# Patient Record
Sex: Male | Born: 1940 | Race: White | Hispanic: No | Marital: Single | Smoking: Former smoker
Health system: Southern US, Community
[De-identification: ages and names within clinical notes are randomized; demographics above are authoritative.]

## PROBLEM LIST (undated history)

## (undated) DIAGNOSIS — N200 Calculus of kidney: Secondary | ICD-10-CM

## (undated) DIAGNOSIS — E785 Hyperlipidemia, unspecified: Secondary | ICD-10-CM

## (undated) DIAGNOSIS — G479 Sleep disorder, unspecified: Secondary | ICD-10-CM

## (undated) DIAGNOSIS — K635 Polyp of colon: Secondary | ICD-10-CM

## (undated) DIAGNOSIS — K7460 Unspecified cirrhosis of liver: Secondary | ICD-10-CM

## (undated) DIAGNOSIS — I8510 Secondary esophageal varices without bleeding: Secondary | ICD-10-CM

## (undated) DIAGNOSIS — N189 Chronic kidney disease, unspecified: Secondary | ICD-10-CM

## (undated) DIAGNOSIS — R161 Splenomegaly, not elsewhere classified: Secondary | ICD-10-CM

## (undated) DIAGNOSIS — D494 Neoplasm of unspecified behavior of bladder: Secondary | ICD-10-CM

## (undated) DIAGNOSIS — I219 Acute myocardial infarction, unspecified: Secondary | ICD-10-CM

## (undated) DIAGNOSIS — K7469 Other cirrhosis of liver: Secondary | ICD-10-CM

## (undated) DIAGNOSIS — IMO0001 Reserved for inherently not codable concepts without codable children: Secondary | ICD-10-CM

## (undated) DIAGNOSIS — E119 Type 2 diabetes mellitus without complications: Secondary | ICD-10-CM

## (undated) DIAGNOSIS — I4891 Unspecified atrial fibrillation: Secondary | ICD-10-CM

## (undated) DIAGNOSIS — Z8709 Personal history of other diseases of the respiratory system: Secondary | ICD-10-CM

## (undated) DIAGNOSIS — K922 Gastrointestinal hemorrhage, unspecified: Secondary | ICD-10-CM

## (undated) DIAGNOSIS — I2510 Atherosclerotic heart disease of native coronary artery without angina pectoris: Secondary | ICD-10-CM

## (undated) DIAGNOSIS — I509 Heart failure, unspecified: Secondary | ICD-10-CM

## (undated) DIAGNOSIS — J439 Emphysema, unspecified: Secondary | ICD-10-CM

## (undated) DIAGNOSIS — N400 Enlarged prostate without lower urinary tract symptoms: Secondary | ICD-10-CM

## (undated) DIAGNOSIS — I639 Cerebral infarction, unspecified: Secondary | ICD-10-CM

## (undated) HISTORY — PX: UPPER GASTROINTESTINAL ENDOSCOPY: SHX188

## (undated) HISTORY — DX: Cerebral infarction, unspecified: I63.9

## (undated) HISTORY — PX: APPENDECTOMY: SHX54

## (undated) HISTORY — DX: Heart failure, unspecified: I50.9

## (undated) HISTORY — PX: ATRIAL ABLATION SURGERY: SHX560

## (undated) HISTORY — DX: Sleep disorder, unspecified: G47.9

## (undated) HISTORY — DX: Polyp of colon: K63.5

## (undated) HISTORY — DX: Splenomegaly, not elsewhere classified: R16.1

## (undated) HISTORY — DX: Unspecified atrial fibrillation: I48.91

## (undated) HISTORY — DX: Type 2 diabetes mellitus without complications: E11.9

## (undated) HISTORY — DX: Unspecified cirrhosis of liver: K74.60

## (undated) HISTORY — PX: TONSILLECTOMY: SHX5217

## (undated) HISTORY — DX: Benign prostatic hyperplasia without lower urinary tract symptoms: N40.0

## (undated) HISTORY — DX: Hyperlipidemia, unspecified: E78.5

## (undated) HISTORY — PX: CORONARY ARTERY BYPASS GRAFT: SHX141

## (undated) HISTORY — DX: Calculus of kidney: N20.0

## (undated) HISTORY — DX: Atherosclerotic heart disease of native coronary artery without angina pectoris: I25.10

## (undated) HISTORY — DX: Reserved for inherently not codable concepts without codable children: IMO0001

## (undated) HISTORY — DX: Unspecified cirrhosis of liver: I85.10

---

## 1999-08-28 ENCOUNTER — Encounter (INDEPENDENT_AMBULATORY_CARE_PROVIDER_SITE_OTHER): Payer: Self-pay | Admitting: Specialist

## 1999-08-28 ENCOUNTER — Ambulatory Visit (HOSPITAL_COMMUNITY): Admission: RE | Admit: 1999-08-28 | Discharge: 1999-08-28 | Payer: Self-pay | Admitting: Gastroenterology

## 1999-09-05 LAB — HM COLONOSCOPY

## 1999-10-02 ENCOUNTER — Encounter (INDEPENDENT_AMBULATORY_CARE_PROVIDER_SITE_OTHER): Payer: Self-pay | Admitting: Specialist

## 1999-10-02 ENCOUNTER — Ambulatory Visit (HOSPITAL_COMMUNITY): Admission: RE | Admit: 1999-10-02 | Discharge: 1999-10-02 | Payer: Self-pay | Admitting: Gastroenterology

## 1999-10-15 ENCOUNTER — Encounter: Admission: RE | Admit: 1999-10-15 | Discharge: 1999-10-15 | Payer: Self-pay | Admitting: Family Medicine

## 1999-10-15 ENCOUNTER — Encounter: Payer: Self-pay | Admitting: Family Medicine

## 2000-02-13 ENCOUNTER — Encounter: Admission: RE | Admit: 2000-02-13 | Discharge: 2000-02-13 | Payer: Self-pay | Admitting: Internal Medicine

## 2000-02-13 ENCOUNTER — Encounter: Payer: Self-pay | Admitting: Internal Medicine

## 2000-08-25 ENCOUNTER — Encounter: Admission: RE | Admit: 2000-08-25 | Discharge: 2000-11-23 | Payer: Self-pay | Admitting: Internal Medicine

## 2004-02-08 ENCOUNTER — Other Ambulatory Visit: Payer: Self-pay

## 2006-04-22 ENCOUNTER — Ambulatory Visit: Payer: Self-pay | Admitting: Internal Medicine

## 2006-04-29 ENCOUNTER — Ambulatory Visit: Payer: Self-pay | Admitting: Internal Medicine

## 2006-05-01 ENCOUNTER — Ambulatory Visit: Payer: Self-pay | Admitting: Cardiology

## 2006-05-13 ENCOUNTER — Ambulatory Visit: Payer: Self-pay | Admitting: Cardiology

## 2006-06-04 ENCOUNTER — Ambulatory Visit: Payer: Self-pay | Admitting: Internal Medicine

## 2006-07-21 ENCOUNTER — Ambulatory Visit: Payer: Self-pay | Admitting: Internal Medicine

## 2006-08-10 ENCOUNTER — Ambulatory Visit: Payer: Self-pay | Admitting: Cardiology

## 2006-08-13 ENCOUNTER — Ambulatory Visit: Payer: Self-pay | Admitting: Internal Medicine

## 2006-08-13 LAB — CONVERTED CEMR LAB
ALT: 54 units/L — ABNORMAL HIGH (ref 0–40)
AST: 25 units/L (ref 0–37)
Albumin: 3.7 g/dL (ref 3.5–5.2)
Alkaline Phosphatase: 56 units/L (ref 39–117)
Bilirubin, Direct: 0.1 mg/dL (ref 0.0–0.3)
Total Bilirubin: 1 mg/dL (ref 0.3–1.2)
Total Protein: 6.7 g/dL (ref 6.0–8.3)

## 2006-09-04 ENCOUNTER — Ambulatory Visit: Payer: Self-pay | Admitting: Internal Medicine

## 2006-11-02 ENCOUNTER — Ambulatory Visit: Payer: Self-pay

## 2006-11-02 ENCOUNTER — Encounter: Payer: Self-pay | Admitting: Internal Medicine

## 2006-11-09 ENCOUNTER — Ambulatory Visit: Payer: Self-pay | Admitting: Cardiology

## 2006-11-11 DIAGNOSIS — L219 Seborrheic dermatitis, unspecified: Secondary | ICD-10-CM

## 2006-11-11 DIAGNOSIS — J438 Other emphysema: Secondary | ICD-10-CM | POA: Insufficient documentation

## 2006-11-11 DIAGNOSIS — I480 Paroxysmal atrial fibrillation: Secondary | ICD-10-CM | POA: Insufficient documentation

## 2006-11-11 DIAGNOSIS — E785 Hyperlipidemia, unspecified: Secondary | ICD-10-CM | POA: Insufficient documentation

## 2006-11-11 DIAGNOSIS — I2510 Atherosclerotic heart disease of native coronary artery without angina pectoris: Secondary | ICD-10-CM | POA: Insufficient documentation

## 2006-12-03 ENCOUNTER — Encounter: Payer: Self-pay | Admitting: Internal Medicine

## 2007-03-19 ENCOUNTER — Ambulatory Visit: Payer: Self-pay | Admitting: Internal Medicine

## 2007-03-22 LAB — CONVERTED CEMR LAB
ALT: 52 units/L (ref 0–53)
Albumin: 4 g/dL (ref 3.5–5.2)
BUN: 18 mg/dL (ref 6–23)
CO2: 32 meq/L (ref 19–32)
Calcium: 9.6 mg/dL (ref 8.4–10.5)
Chloride: 107 meq/L (ref 96–112)
Cholesterol: 97 mg/dL (ref 0–200)
Creatinine, Ser: 1.2 mg/dL (ref 0.4–1.5)
GFR calc Af Amer: 78 mL/min
GFR calc non Af Amer: 65 mL/min
Glucose, Bld: 50 mg/dL — ABNORMAL LOW (ref 70–99)
HDL: 26.1 mg/dL — ABNORMAL LOW (ref >39.0)
Hgb A1c MFr Bld: 7.9 % — ABNORMAL HIGH (ref 4.6–6.0)
LDL Cholesterol: 58 mg/dL (ref 0–99)
Phosphorus: 4.1 mg/dL (ref 2.3–4.6)
Potassium: 5.3 meq/L — ABNORMAL HIGH (ref 3.5–5.1)
Sodium: 145 meq/L (ref 135–145)
Total CHOL/HDL Ratio: 3.7
Triglycerides: 66 mg/dL (ref 0–149)
VLDL: 13 mg/dL (ref 0–40)

## 2007-04-06 ENCOUNTER — Telehealth: Payer: Self-pay | Admitting: Internal Medicine

## 2007-04-06 ENCOUNTER — Ambulatory Visit: Payer: Self-pay | Admitting: Internal Medicine

## 2007-04-06 LAB — CONVERTED CEMR LAB: Potassium: 4.8 meq/L (ref 3.5–5.1)

## 2007-04-12 ENCOUNTER — Ambulatory Visit: Payer: Self-pay | Admitting: Cardiology

## 2007-04-19 ENCOUNTER — Ambulatory Visit: Payer: Self-pay | Admitting: Internal Medicine

## 2007-05-26 ENCOUNTER — Ambulatory Visit: Payer: Self-pay | Admitting: Cardiology

## 2007-06-10 ENCOUNTER — Encounter (INDEPENDENT_AMBULATORY_CARE_PROVIDER_SITE_OTHER): Payer: Self-pay | Admitting: *Deleted

## 2007-06-23 ENCOUNTER — Telehealth: Payer: Self-pay | Admitting: Internal Medicine

## 2007-06-23 ENCOUNTER — Ambulatory Visit: Payer: Self-pay | Admitting: Internal Medicine

## 2007-08-16 ENCOUNTER — Ambulatory Visit: Payer: Self-pay | Admitting: Internal Medicine

## 2007-08-16 DIAGNOSIS — E1140 Type 2 diabetes mellitus with diabetic neuropathy, unspecified: Secondary | ICD-10-CM

## 2007-08-18 LAB — CONVERTED CEMR LAB
ALT: 53 units/L (ref 0–53)
AST: 29 units/L (ref 0–37)
Albumin: 4 g/dL (ref 3.5–5.2)
Alkaline Phosphatase: 59 units/L (ref 39–117)
BUN: 27 mg/dL — ABNORMAL HIGH (ref 6–23)
Basophils Absolute: 0.2 10*3/uL — ABNORMAL HIGH (ref 0.0–0.1)
Basophils Relative: 2.5 % — ABNORMAL HIGH (ref 0.0–1.0)
Bilirubin, Direct: 0.3 mg/dL (ref 0.0–0.3)
CO2: 31 meq/L (ref 19–32)
Calcium: 9.5 mg/dL (ref 8.4–10.5)
Chloride: 102 meq/L (ref 96–112)
Cholesterol: 98 mg/dL (ref 0–200)
Creatinine, Ser: 1.5 mg/dL (ref 0.4–1.5)
Creatinine,U: 87.6 mg/dL
Eosinophils Absolute: 0.2 10*3/uL (ref 0.0–0.6)
Eosinophils Relative: 3.3 % (ref 0.0–5.0)
GFR calc Af Amer: 60 mL/min
GFR calc non Af Amer: 50 mL/min
Glucose, Bld: 88 mg/dL (ref 70–99)
HCT: 42.7 % (ref 39.0–52.0)
HDL: 21.2 mg/dL — ABNORMAL LOW (ref >39.0)
Hemoglobin: 14.6 g/dL (ref 13.0–17.0)
Hgb A1c MFr Bld: 7.5 % — ABNORMAL HIGH (ref 4.6–6.0)
LDL Cholesterol: 60 mg/dL (ref 0–99)
Lymphocytes Relative: 19.4 % (ref 12.0–46.0)
MCHC: 34.2 g/dL (ref 30.0–36.0)
MCV: 92.7 fL (ref 78.0–100.0)
Microalb Creat Ratio: 16 mg/g (ref 0.0–30.0)
Microalb, Ur: 1.4 mg/dL (ref 0.0–1.9)
Monocytes Absolute: 0.4 10*3/uL (ref 0.2–0.7)
Monocytes Relative: 6.3 % (ref 3.0–11.0)
Neutro Abs: 4.7 10*3/uL (ref 1.4–7.7)
Neutrophils Relative %: 68.5 % (ref 43.0–77.0)
Phosphorus: 5.4 mg/dL — ABNORMAL HIGH (ref 2.3–4.6)
Platelets: 181 10*3/uL (ref 150–400)
Potassium: 4.9 meq/L (ref 3.5–5.1)
RBC: 4.61 M/uL (ref 4.22–5.81)
RDW: 13.3 % (ref 11.5–14.6)
Sodium: 142 meq/L (ref 135–145)
TSH: 2 microintl units/mL (ref 0.35–5.50)
Total Bilirubin: 1.7 mg/dL — ABNORMAL HIGH (ref 0.3–1.2)
Total CHOL/HDL Ratio: 4.6
Total Protein: 7.1 g/dL (ref 6.0–8.3)
Triglycerides: 83 mg/dL (ref 0–149)
VLDL: 17 mg/dL (ref 0–40)
Vitamin B-12: 612 pg/mL (ref 211–911)
WBC: 6.8 10*3/uL (ref 4.5–10.5)

## 2007-08-30 ENCOUNTER — Telehealth: Payer: Self-pay | Admitting: Internal Medicine

## 2007-09-09 ENCOUNTER — Ambulatory Visit: Payer: Self-pay | Admitting: Internal Medicine

## 2007-09-20 ENCOUNTER — Ambulatory Visit: Payer: Self-pay | Admitting: Cardiology

## 2007-12-06 ENCOUNTER — Telehealth: Payer: Self-pay | Admitting: Internal Medicine

## 2007-12-08 ENCOUNTER — Ambulatory Visit: Payer: Self-pay | Admitting: Internal Medicine

## 2007-12-09 LAB — CONVERTED CEMR LAB
ALT: 46 units/L (ref 0–53)
AST: 26 units/L (ref 0–37)
Albumin: 4.1 g/dL (ref 3.5–5.2)
Alkaline Phosphatase: 55 units/L (ref 39–117)
BUN: 21 mg/dL (ref 6–23)
Bilirubin, Direct: 0.2 mg/dL (ref 0.0–0.3)
CO2: 34 meq/L — ABNORMAL HIGH (ref 19–32)
Calcium: 9.3 mg/dL (ref 8.4–10.5)
Chloride: 105 meq/L (ref 96–112)
Cholesterol: 122 mg/dL (ref 0–200)
Creatinine, Ser: 1.1 mg/dL (ref 0.4–1.5)
GFR calc Af Amer: 86 mL/min
GFR calc non Af Amer: 71 mL/min
Glucose, Bld: 100 mg/dL — ABNORMAL HIGH (ref 70–99)
HDL: 26.4 mg/dL — ABNORMAL LOW (ref >39.0)
Hgb A1c MFr Bld: 7.7 % — ABNORMAL HIGH (ref 4.6–6.0)
LDL Cholesterol: 83 mg/dL (ref 0–99)
Phosphorus: 3.5 mg/dL (ref 2.3–4.6)
Potassium: 4.3 meq/L (ref 3.5–5.1)
Sodium: 143 meq/L (ref 135–145)
Total Bilirubin: 1.5 mg/dL — ABNORMAL HIGH (ref 0.3–1.2)
Total CHOL/HDL Ratio: 4.6
Total Protein: 7.4 g/dL (ref 6.0–8.3)
Triglycerides: 64 mg/dL (ref 0–149)
VLDL: 13 mg/dL (ref 0–40)

## 2008-05-19 ENCOUNTER — Ambulatory Visit: Payer: Self-pay | Admitting: Internal Medicine

## 2008-05-30 ENCOUNTER — Encounter (INDEPENDENT_AMBULATORY_CARE_PROVIDER_SITE_OTHER): Payer: Self-pay | Admitting: *Deleted

## 2008-06-12 ENCOUNTER — Telehealth: Payer: Self-pay | Admitting: Internal Medicine

## 2008-09-20 ENCOUNTER — Ambulatory Visit: Payer: Self-pay | Admitting: Internal Medicine

## 2008-09-20 DIAGNOSIS — N400 Enlarged prostate without lower urinary tract symptoms: Secondary | ICD-10-CM | POA: Insufficient documentation

## 2008-09-26 ENCOUNTER — Ambulatory Visit: Payer: Self-pay | Admitting: Cardiology

## 2008-10-02 LAB — CONVERTED CEMR LAB
ALT: 61 units/L — ABNORMAL HIGH (ref 0–53)
AST: 35 units/L (ref 0–37)
Albumin: 4.2 g/dL (ref 3.5–5.2)
Alkaline Phosphatase: 62 units/L (ref 39–117)
BUN: 29 mg/dL — ABNORMAL HIGH (ref 6–23)
Basophils Absolute: 0 10*3/uL (ref 0.0–0.1)
Basophils Relative: 0.2 % (ref 0.0–3.0)
Bilirubin, Direct: 0.2 mg/dL (ref 0.0–0.3)
CO2: 32 meq/L (ref 19–32)
Calcium: 9.6 mg/dL (ref 8.4–10.5)
Chloride: 101 meq/L (ref 96–112)
Cholesterol: 120 mg/dL (ref 0–200)
Creatinine, Ser: 1.2 mg/dL (ref 0.4–1.5)
Creatinine,U: 83.3 mg/dL
Direct LDL: 65.9 mg/dL
Eosinophils Absolute: 0.3 10*3/uL (ref 0.0–0.7)
Eosinophils Relative: 4.5 % (ref 0.0–5.0)
GFR calc Af Amer: 78 mL/min
GFR calc non Af Amer: 64 mL/min
Glucose, Bld: 120 mg/dL — ABNORMAL HIGH (ref 70–99)
HCT: 47.1 % (ref 39.0–52.0)
HDL: 25.6 mg/dL — ABNORMAL LOW (ref >39.0)
Hemoglobin: 16.3 g/dL (ref 13.0–17.0)
Hgb A1c MFr Bld: 8.1 % — ABNORMAL HIGH (ref 4.6–6.0)
Lymphocytes Relative: 21.4 % (ref 12.0–46.0)
MCHC: 34.5 g/dL (ref 30.0–36.0)
MCV: 94.8 fL (ref 78.0–100.0)
Microalb Creat Ratio: 61.2 mg/g — ABNORMAL HIGH (ref 0.0–30.0)
Microalb, Ur: 5.1 mg/dL — ABNORMAL HIGH (ref 0.0–1.9)
Monocytes Absolute: 0.8 10*3/uL (ref 0.1–1.0)
Monocytes Relative: 11.2 % (ref 3.0–12.0)
Neutro Abs: 4.5 10*3/uL (ref 1.4–7.7)
Neutrophils Relative %: 62.7 % (ref 43.0–77.0)
Phosphorus: 3.7 mg/dL (ref 2.3–4.6)
Platelets: 167 10*3/uL (ref 150–400)
Potassium: 4.7 meq/L (ref 3.5–5.1)
RBC: 4.97 M/uL (ref 4.22–5.81)
RDW: 13.2 % (ref 11.5–14.6)
Sodium: 139 meq/L (ref 135–145)
TSH: 1.58 microintl units/mL (ref 0.35–5.50)
Total Bilirubin: 1.3 mg/dL — ABNORMAL HIGH (ref 0.3–1.2)
Total CHOL/HDL Ratio: 4.7
Total Protein: 7.5 g/dL (ref 6.0–8.3)
Triglycerides: 205 mg/dL (ref 0–149)
VLDL: 41 mg/dL — ABNORMAL HIGH (ref 0–40)
WBC: 7.1 10*3/uL (ref 4.5–10.5)

## 2008-10-05 ENCOUNTER — Ambulatory Visit: Payer: Self-pay | Admitting: Family Medicine

## 2008-10-06 ENCOUNTER — Telehealth (INDEPENDENT_AMBULATORY_CARE_PROVIDER_SITE_OTHER): Payer: Self-pay | Admitting: Internal Medicine

## 2008-11-16 ENCOUNTER — Telehealth: Payer: Self-pay | Admitting: Internal Medicine

## 2008-11-23 ENCOUNTER — Ambulatory Visit: Payer: Self-pay | Admitting: Family Medicine

## 2009-01-10 ENCOUNTER — Telehealth (INDEPENDENT_AMBULATORY_CARE_PROVIDER_SITE_OTHER): Payer: Self-pay | Admitting: *Deleted

## 2009-01-10 ENCOUNTER — Encounter: Payer: Self-pay | Admitting: Internal Medicine

## 2009-01-18 ENCOUNTER — Ambulatory Visit: Payer: Self-pay | Admitting: Internal Medicine

## 2009-01-18 DIAGNOSIS — G479 Sleep disorder, unspecified: Secondary | ICD-10-CM | POA: Insufficient documentation

## 2009-01-23 LAB — CONVERTED CEMR LAB
ALT: 37 units/L (ref 0–53)
AST: 27 units/L (ref 0–37)
Albumin: 4.2 g/dL (ref 3.5–5.2)
Alkaline Phosphatase: 63 units/L (ref 39–117)
BUN: 19 mg/dL (ref 6–23)
Basophils Absolute: 0 10*3/uL (ref 0.0–0.1)
Basophils Relative: 0 % (ref 0.0–3.0)
Bilirubin, Direct: 0.2 mg/dL (ref 0.0–0.3)
CO2: 30 meq/L (ref 19–32)
Calcium: 9.4 mg/dL (ref 8.4–10.5)
Chloride: 99 meq/L (ref 96–112)
Cholesterol: 113 mg/dL (ref 0–200)
Creatinine, Ser: 1.1 mg/dL (ref 0.4–1.5)
Direct LDL: 60 mg/dL
Eosinophils Absolute: 0.3 10*3/uL (ref 0.0–0.7)
Eosinophils Relative: 4.5 % (ref 0.0–5.0)
Glucose, Bld: 324 mg/dL — ABNORMAL HIGH (ref 70–99)
HCT: 41.1 % (ref 39.0–52.0)
HDL: 24.8 mg/dL — ABNORMAL LOW (ref >39.00)
Hemoglobin: 14.6 g/dL (ref 13.0–17.0)
Hgb A1c MFr Bld: 8 % — ABNORMAL HIGH (ref 4.6–6.5)
Lymphocytes Relative: 14.1 % (ref 12.0–46.0)
Lymphs Abs: 1 10*3/uL (ref 0.7–4.0)
MCHC: 35.5 g/dL (ref 30.0–36.0)
MCV: 92.8 fL (ref 78.0–100.0)
Monocytes Absolute: 0.4 10*3/uL (ref 0.1–1.0)
Monocytes Relative: 5.9 % (ref 3.0–12.0)
Neutro Abs: 5.3 10*3/uL (ref 1.4–7.7)
Neutrophils Relative %: 75.5 % (ref 43.0–77.0)
Phosphorus: 3.2 mg/dL (ref 2.3–4.6)
Platelets: 162 10*3/uL (ref 150.0–400.0)
Potassium: 5.1 meq/L (ref 3.5–5.1)
RBC: 4.43 M/uL (ref 4.22–5.81)
RDW: 13.2 % (ref 11.5–14.6)
Sodium: 137 meq/L (ref 135–145)
Total Bilirubin: 1.3 mg/dL — ABNORMAL HIGH (ref 0.3–1.2)
Total CHOL/HDL Ratio: 5
Total Protein: 7.4 g/dL (ref 6.0–8.3)
Triglycerides: 231 mg/dL — ABNORMAL HIGH (ref 0.0–149.0)
VLDL: 46.2 mg/dL — ABNORMAL HIGH (ref 0.0–40.0)
WBC: 7 10*3/uL (ref 4.5–10.5)

## 2009-03-14 ENCOUNTER — Telehealth: Payer: Self-pay | Admitting: Internal Medicine

## 2009-04-06 ENCOUNTER — Telehealth: Payer: Self-pay | Admitting: Internal Medicine

## 2009-05-21 ENCOUNTER — Telehealth: Payer: Self-pay | Admitting: Internal Medicine

## 2009-05-24 ENCOUNTER — Ambulatory Visit: Payer: Self-pay | Admitting: Internal Medicine

## 2009-06-18 ENCOUNTER — Telehealth: Payer: Self-pay | Admitting: Internal Medicine

## 2009-07-13 ENCOUNTER — Ambulatory Visit: Payer: Self-pay | Admitting: Internal Medicine

## 2009-07-20 LAB — CONVERTED CEMR LAB
ALT: 51 units/L (ref 0–53)
AST: 30 units/L (ref 0–37)
Albumin: 4 g/dL (ref 3.5–5.2)
Alkaline Phosphatase: 57 units/L (ref 39–117)
BUN: 21 mg/dL (ref 6–23)
Basophils Absolute: 0 10*3/uL (ref 0.0–0.1)
Basophils Relative: 0.6 % (ref 0.0–3.0)
Bilirubin, Direct: 0.2 mg/dL (ref 0.0–0.3)
CO2: 33 meq/L — ABNORMAL HIGH (ref 19–32)
Calcium: 9.5 mg/dL (ref 8.4–10.5)
Chloride: 104 meq/L (ref 96–112)
Cholesterol: 121 mg/dL (ref 0–200)
Creatinine, Ser: 1.4 mg/dL (ref 0.4–1.5)
Digitoxin Lvl: 0.5 ng/mL — ABNORMAL LOW (ref 0.8–2.0)
Direct LDL: 72.9 mg/dL
Eosinophils Absolute: 0.2 10*3/uL (ref 0.0–0.7)
Eosinophils Relative: 3.1 % (ref 0.0–5.0)
GFR calc non Af Amer: 53.64 mL/min (ref >60)
Glucose, Bld: 177 mg/dL — ABNORMAL HIGH (ref 70–99)
HCT: 46.1 % (ref 39.0–52.0)
HDL: 23.8 mg/dL — ABNORMAL LOW (ref >39.00)
Hemoglobin: 15.8 g/dL (ref 13.0–17.0)
Hgb A1c MFr Bld: 9.1 % — ABNORMAL HIGH (ref 4.6–6.5)
Lymphocytes Relative: 19.6 % (ref 12.0–46.0)
Lymphs Abs: 1.3 10*3/uL (ref 0.7–4.0)
MCHC: 34.2 g/dL (ref 30.0–36.0)
MCV: 92 fL (ref 78.0–100.0)
Monocytes Absolute: 0.9 10*3/uL (ref 0.1–1.0)
Monocytes Relative: 13.3 % — ABNORMAL HIGH (ref 3.0–12.0)
Neutro Abs: 4.4 10*3/uL (ref 1.4–7.7)
Neutrophils Relative %: 63.4 % (ref 43.0–77.0)
Phosphorus: 4.4 mg/dL (ref 2.3–4.6)
Platelets: 168 10*3/uL (ref 150.0–400.0)
Potassium: 4.8 meq/L (ref 3.5–5.1)
RBC: 5.01 M/uL (ref 4.22–5.81)
RDW: 14 % (ref 11.5–14.6)
Sodium: 143 meq/L (ref 135–145)
TSH: 1.95 microintl units/mL (ref 0.35–5.50)
Total Bilirubin: 1.8 mg/dL — ABNORMAL HIGH (ref 0.3–1.2)
Total CHOL/HDL Ratio: 5
Total Protein: 7.3 g/dL (ref 6.0–8.3)
Triglycerides: 221 mg/dL — ABNORMAL HIGH (ref 0.0–149.0)
VLDL: 44.2 mg/dL — ABNORMAL HIGH (ref 0.0–40.0)
WBC: 6.8 10*3/uL (ref 4.5–10.5)

## 2009-08-09 ENCOUNTER — Telehealth: Payer: Self-pay | Admitting: Internal Medicine

## 2009-08-15 ENCOUNTER — Telehealth: Payer: Self-pay | Admitting: Internal Medicine

## 2009-08-27 ENCOUNTER — Encounter: Payer: Self-pay | Admitting: Internal Medicine

## 2009-08-27 LAB — HM DIABETES EYE EXAM

## 2009-08-29 ENCOUNTER — Telehealth: Payer: Self-pay | Admitting: Internal Medicine

## 2009-09-06 ENCOUNTER — Ambulatory Visit: Payer: Self-pay | Admitting: Cardiovascular Disease

## 2009-09-06 DIAGNOSIS — R0602 Shortness of breath: Secondary | ICD-10-CM

## 2009-09-06 DIAGNOSIS — R9431 Abnormal electrocardiogram [ECG] [EKG]: Secondary | ICD-10-CM

## 2009-09-07 ENCOUNTER — Encounter: Payer: Self-pay | Admitting: Cardiovascular Disease

## 2009-09-20 ENCOUNTER — Telehealth (INDEPENDENT_AMBULATORY_CARE_PROVIDER_SITE_OTHER): Payer: Self-pay | Admitting: *Deleted

## 2009-09-24 ENCOUNTER — Ambulatory Visit: Payer: Self-pay | Admitting: Cardiovascular Disease

## 2009-09-24 ENCOUNTER — Ambulatory Visit (HOSPITAL_COMMUNITY): Admission: RE | Admit: 2009-09-24 | Discharge: 2009-09-24 | Payer: Self-pay | Admitting: Cardiovascular Disease

## 2009-09-24 ENCOUNTER — Encounter: Payer: Self-pay | Admitting: Cardiovascular Disease

## 2009-09-24 ENCOUNTER — Ambulatory Visit: Payer: Self-pay

## 2009-09-24 ENCOUNTER — Encounter (HOSPITAL_COMMUNITY): Admission: RE | Admit: 2009-09-24 | Discharge: 2009-12-04 | Payer: Self-pay | Admitting: Cardiovascular Disease

## 2009-09-27 ENCOUNTER — Telehealth: Payer: Self-pay | Admitting: Cardiovascular Disease

## 2009-11-12 ENCOUNTER — Telehealth: Payer: Self-pay | Admitting: Internal Medicine

## 2009-11-15 ENCOUNTER — Telehealth: Payer: Self-pay | Admitting: Internal Medicine

## 2009-12-20 ENCOUNTER — Ambulatory Visit: Payer: Self-pay | Admitting: Internal Medicine

## 2009-12-20 DIAGNOSIS — E162 Hypoglycemia, unspecified: Secondary | ICD-10-CM | POA: Insufficient documentation

## 2009-12-25 LAB — CONVERTED CEMR LAB: Hgb A1c MFr Bld: 7.5 % — ABNORMAL HIGH (ref 4.6–6.5)

## 2009-12-31 ENCOUNTER — Observation Stay: Payer: Self-pay | Admitting: Specialist

## 2009-12-31 ENCOUNTER — Encounter: Payer: Self-pay | Admitting: Internal Medicine

## 2010-01-09 ENCOUNTER — Ambulatory Visit: Payer: Self-pay | Admitting: Internal Medicine

## 2010-01-10 ENCOUNTER — Encounter: Payer: Self-pay | Admitting: Internal Medicine

## 2010-01-14 ENCOUNTER — Telehealth: Payer: Self-pay | Admitting: Internal Medicine

## 2010-01-14 ENCOUNTER — Ambulatory Visit: Payer: Self-pay | Admitting: Internal Medicine

## 2010-01-14 DIAGNOSIS — G459 Transient cerebral ischemic attack, unspecified: Secondary | ICD-10-CM | POA: Insufficient documentation

## 2010-01-22 ENCOUNTER — Encounter: Payer: Self-pay | Admitting: Internal Medicine

## 2010-02-25 ENCOUNTER — Ambulatory Visit: Payer: Self-pay | Admitting: Internal Medicine

## 2010-02-28 ENCOUNTER — Telehealth: Payer: Self-pay | Admitting: Internal Medicine

## 2010-03-04 ENCOUNTER — Encounter: Payer: Self-pay | Admitting: Internal Medicine

## 2010-05-07 ENCOUNTER — Telehealth: Payer: Self-pay | Admitting: Internal Medicine

## 2010-07-26 ENCOUNTER — Encounter: Payer: Self-pay | Admitting: Internal Medicine

## 2010-07-26 ENCOUNTER — Ambulatory Visit: Payer: Self-pay | Admitting: Internal Medicine

## 2010-07-30 LAB — CONVERTED CEMR LAB
ALT: 51 units/L (ref 0–53)
AST: 31 units/L (ref 0–37)
Albumin: 4.5 g/dL (ref 3.5–5.2)
Alkaline Phosphatase: 111 units/L (ref 39–117)
BUN: 22 mg/dL (ref 6–23)
Basophils Absolute: 0 10*3/uL (ref 0.0–0.1)
Basophils Relative: 0 % (ref 0–1)
Creatinine, Ser: 0.99 mg/dL (ref 0.40–1.50)
Eosinophils Absolute: 0.2 10*3/uL (ref 0.0–0.7)
HDL: 26 mg/dL — ABNORMAL LOW (ref >39)
Hgb A1c MFr Bld: 8.2 % — ABNORMAL HIGH (ref <5.7)
LDL Cholesterol: 44 mg/dL (ref 0–99)
MCHC: 33.9 g/dL (ref 30.0–36.0)
MCV: 92.3 fL (ref 78.0–100.0)
Monocytes Relative: 11 % (ref 3–12)
Neutro Abs: 4.2 10*3/uL (ref 1.7–7.7)
Neutrophils Relative %: 68 % (ref 43–77)
Potassium: 4.8 meq/L (ref 3.5–5.3)
RBC: 5.05 M/uL (ref 4.22–5.81)
RDW: 13.6 % (ref 11.5–15.5)
TSH: 2.485 microintl units/mL (ref 0.350–4.500)
Total CHOL/HDL Ratio: 3.3

## 2010-08-13 ENCOUNTER — Telehealth: Payer: Self-pay | Admitting: Internal Medicine

## 2010-08-23 ENCOUNTER — Ambulatory Visit
Admission: RE | Admit: 2010-08-23 | Discharge: 2010-08-23 | Payer: Self-pay | Source: Home / Self Care | Attending: Family Medicine | Admitting: Family Medicine

## 2010-08-23 ENCOUNTER — Ambulatory Visit: Admission: RE | Admit: 2010-08-23 | Payer: Self-pay | Source: Home / Self Care | Admitting: Internal Medicine

## 2010-08-23 DIAGNOSIS — T148XXA Other injury of unspecified body region, initial encounter: Secondary | ICD-10-CM | POA: Insufficient documentation

## 2010-08-30 ENCOUNTER — Ambulatory Visit
Admission: RE | Admit: 2010-08-30 | Discharge: 2010-08-30 | Payer: Self-pay | Source: Home / Self Care | Attending: Family Medicine | Admitting: Family Medicine

## 2010-09-05 NOTE — Progress Notes (Signed)
Summary: Rx Lorazepam  Phone Note Refill Request Call back at [phone] Message from:  [facility] on November 12, 2009 11:07 AM  Refills Requested: Medication #1:  LORAZEPAM 0.5 MG TABS 1/2 - 1 at bedtime as needed to help sleep.   Last Refilled: 10/11/2009 Received faxed refill request form in your IN box.   Method Requested: Fax to Local Pharmacy Initial call taken by: Linde Gillis CMA Duncan Dull),  November 12, 2009 11:08 AM  Follow-up for Phone Call        okay #30 x 2 Follow-up by: Cindee Salt MD,  November 12, 2009 1:26 PM  Additional Follow-up for Phone Call Additional follow up Details #1::        Rx faxed to pharmacy Additional Follow-up by: DeShannon Smith CMA Duncan Dull),  November 12, 2009 2:13 PM    Prescriptions: LORAZEPAM 0.5 MG TABS (LORAZEPAM) 1/2 - 1 at bedtime as needed to help sleep  #30 x 2   Entered by:   Mervin Hack CMA (AAMA)   Authorized by:   Cindee Salt MD   Signed by:   Mervin Hack CMA (AAMA) on 11/12/2009   Method used:   Handwritten   RxID:   [id number]

## 2010-09-05 NOTE — Letter (Signed)
Summary: [facility]  [facility]   Imported By: Lanelle Bal 03/12/2010 11:49:33  _____________________________________________________________________  External Attachment:    Type:   Image     Comment:   External Document  Appended Document[facility]

## 2010-09-05 NOTE — Progress Notes (Signed)
Summary: GLIPIZIDE XL  Phone Note Refill Request Message from:  [facility] on January 14, 2010 12:18 PM  Refills Requested: Medication #1:  GLIPIZIDE XL 10 MG  TB24 Take 1 tablet by mouth once a day before breakfast received fax asking to clarify dosage on Glipizide. Per office visit on 12/20/2009, pt was changed to once daily instead of two times a day.    Method Requested: Electronic Initial call taken by: Mervin Hack CMA Duncan Dull),  January 14, 2010 12:19 PM  Follow-up for Phone Call        correct Follow-up by: Cindee Salt MD,  January 14, 2010 2:08 PM  Additional Follow-up for Phone Call Additional follow up Details #1::        rx re-sent Additional Follow-up by: Mervin Hack CMA Duncan Dull),  January 14, 2010 2:45 PM    Prescriptions: GLIPIZIDE XL 10 MG  TB24 (GLIPIZIDE) Take 1 tablet by mouth once a day before breakfast  #30 x 12   Entered by:   Mervin Hack CMA (AAMA)   Authorized by:   Cindee Salt MD   Signed by:   Mervin Hack CMA (AAMA) on 01/14/2010   Method used:   Electronically to        [facility] (retail)       [address]       [place], [place]  [address]       Ph: [phone]       Fax: [phone]   RxID:   [id number]

## 2010-09-05 NOTE — Progress Notes (Signed)
Summary: Rx Lorazepam  Phone Note Refill Request Call back at [phone] Message from:  [facility] on August 15, 2009 11:26 AM  Refills Requested: Medication #1:  LORAZEPAM 0.5 MG TABS 1/2 - 1 at bedtime as needed to help sleep.   Last Refilled: 07/16/2009 Received faxed refill request, please advise   Method Requested: Electronic Initial call taken by: Linde Gillis CMA Duncan Dull),  August 15, 2009 11:29 AM  Follow-up for Phone Call        okay #30 x 2 Follow-up by: Cindee Salt MD,  August 15, 2009 2:07 PM  Additional Follow-up for Phone Call Additional follow up Details #1::        Rx called to pharmacy Additional Follow-up by: Linde Gillis CMA Duncan Dull),  August 15, 2009 2:36 PM

## 2010-09-05 NOTE — Assessment & Plan Note (Signed)
Summary: PT HAVING DIZZY SPELLS  Medications Added LANTUS 100 UNIT/ML  SOLN (INSULIN GLARGINE) 40  units at bedtime HUMULIN R 100 UNIT/ML INJ SOLN (INSULIN REGULAR HUMAN) Inject 40 units three times a day before meals      Allergies Added: NKDA  Visit Type:  New Patient Primary Provider:  Cindee Salt MD  CC:  SOB, chest pain at night when can sleep, and swelling in hands feet and ankles.  History of Present Illness: [name] is a pleasant 70 year old gentleman with past medical history of diabetes, coronary artery disease, bypass 1996, Atrial fibrillation with history of ablation, hyperlipidemia, obstructive sleep apnea, hypertension and COPD who presents for routine followup.  [name] states that he's been having worsening shortness of breath over the past 2 months. It is been a slow progressive decline in his ability to exert himself. He states that it has been very noticeable. He is been sleeping on several pillows or in a recliner due to shortness of breath. He's noticed more swelling in his ankles. He states it has been several years since he's had any workup including stress test and echocardiography. He has had a history of CHF in the past.  he's also had a little bit of a cough which is new, no lightheadedness, dizziness or significant chest pain.  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 Take 1 Tablet By Mouth Twice A Day 2)  Lipitor 20 Mg  Tabs (Atorvastatin Calcium) .... Take 1 Tablet By Mouth Once A Day 3)  Glipizide Xl 10 Mg  Tb24 (Glipizide) .... Take 1 Tablet By Mouth Two Times A Day 4)  Lantus 100 Unit/ml  Soln (Insulin Glargine) .... 40  Units At Bedtime 5)  Humulin R 100 Unit/ml Inj Soln (Insulin Regular Human) .... Inject 40 Units Three Times A Day Before Meals 6)  Coreg 12.5 Mg  Tabs (Carvedilol) .... Take 1 Tablet By Mouth Two Times A Day 7)  Imdur 30 Mg  Tb24 (Isosorbide Mononitrate) .... Take 1 Tablet By Mouth Once A Day 8)  Furosemide 40 Mg Tabs (Furosemide) .Marland Kitchen.. 1 Daily By Mouth 9)  Adult Aspirin Ec Low Strength 81 Mg Tbec (Aspirin) .... Take 1 By Mouth Once Daily 10)  Vitamin D 1000 Unit Caps (Cholecalciferol) .... Take 1 By Mouth Once Daily 11)  Bd Insulin Syringe Ultrafine 31g X 5/16" 1 Ml Misc (Insulin Syringe-Needle U-100) .... Use As Directed 12)  Triamcinolone Acetonide 0.1 % Crea (Triamcinolone Acetonide) .... Apply Two Times A Day To Rash As Needed 13)  Truetrack Test  Strp (Glucose Blood) .... Patient Test Blood Sugar 3-5 Times Daily. Dx:250.40 14)  Digitek 0.25 Mg Tabs (Digoxin) .... Take 1 Tablet By Mouth Once A Day 15)  Vitamin-B Complex  Tabs (B Complex Vitamins) .... Take 1 By Mouth Once Daily 16)  Vitamin E 1000 Unit Caps (Vitamin E) .... Take 1 By Mouth Once  Daily 17)  Lorazepam 0.5 Mg Tabs (Lorazepam) .... 1/2 - 1 At Bedtime As Needed To Help Sleep  Allergies (verified): No Known Drug Allergies  Past History:  Past Medical History: Last updated: 01/18/2009 Atrial fibrillation S/P ablation Coronary artery disease S/P MI 1980's----------------------Dr McDowell Hyperlipidemia CHF NIDDM with nephropathy Benign prostatic hypertrophy CVA 2004 (ICD-434.91) Sleep disturbance  Past Surgical History: Last updated: 11-25-2006 S/P CABG 1980's Endoscopy,  Gastritis , ? H. pylori 08/28/1999 Colonoscopy broad  base polyp 09/01/1999 Abd. CT- wnl 3/01 A-fib,shock  then ablation 1990's Appendectomy 1950's Tonsillectomy Stress- LV dilated EF 40%, past MI 2006  Family History: Last updated: 11-25-2006 Dad died @66  Mom died @79  2 brothers 2 sisters DM strong on Mom's side CAD, PVD strong on Dad's side No history of prostate or colon cancer  Social History: Last updated: 01/08/2009 Disabled due to heart disease Single--lives alone--11/2008 staying with sister who has Alzheimer's Former Smoker - 1980's Alcohol Use - no  Risk Factors: Smoking Status: quit (2006-11-25)  Vital Signs:  Patient profile:   70 year old male Height:      68 inches Weight:      210 pounds BMI:     32.05 Pulse rate:   66 / minute Pulse rhythm:   regular BP sitting:   116 / 60  (left arm) Cuff size:   regular  Physical Exam  General:  elderly gentleman in no apparent distress, HEENT exam is benign, oropharynx is clear, neck is supple with no JVP or carotid bruits, heart sounds are regular with normal S1 and S2 and no murmurs appreciated, lungs are clear to auscultation with no wheezes or rales. Abdominal exam is benign, nontender nondistended. Trace lower extremity edema from the mid shins and ankles. Neurological exam is grossly nonfocal, skin is warm and dry. Pulses are equal and symmetrical in his upper and lower extremities.   New Orders:     1)  Echocardiogram (Echo)     2)  Nuclear Stress Test (Nuc Stress Test)   EKG  Procedure date:  09/06/2009  Findings:      EKG shows normal sinus rhythm with a rate of 66 beats per minute, left anterior fascicular block, nonspecific intraventricular delay, unable to rule out intraseptal infarct.  Impression & Recommendations:  Problem # 1:  SHORTNESS OF BREATH (ICD-786.05) shortness of breath is Mr. minors biggest complaint. This has been progressive over the past 2 months. I'm concerned that this may be his anginal equivalent. As he had no workup in many  years and has a history of bypass surgery over 14 years ago, I ordered an echocardiogram to rule out valvular or other cardiac pathology and rule out pulmonary hypertension. I've ordered a stress test to rule out ischemia. It is impossible that he may be having graft with native vessel disease.we will call him with the results as soon as they are available. If there is significant ischemia, we will see him back in clinic for further followup and possible cardiac catheterization. His updated medication list for this problem includes:    Quinapril Hcl 10 Mg Tabs (Quinapril hcl) .Marland Kitchen... Take 1 tablet by mouth twice a day    Coreg 12.5 Mg Tabs (Carvedilol) .Marland Kitchen... Take 1 tablet by mouth two times a day    Furosemide 40 Mg Tabs (Furosemide) .Marland Kitchen... 1 daily by mouth    Adult Aspirin Ec Low Strength 81 Mg Tbec (Aspirin) .Marland Kitchen... Take 1 by mouth once daily    Digitek 0.25 Mg Tabs (Digoxin) .Marland KitchenMarland KitchenMarland KitchenMarland Kitchen  Take 1 tablet by mouth once a day  Orders: Echocardiogram (Echo)  Problem # 2:  CORONARY ARTERY DISEASE (ICD-414.00) as detailed above, he has underlying coronary disease and history of bypass. We have ordered a stress test rule out ischemia given his new symptoms of worsening shortness of breath. His updated medication list for this problem includes:    Quinapril Hcl 10 Mg Tabs (Quinapril hcl) .Marland Kitchen... Take 1 tablet by mouth twice a day    Coreg 12.5 Mg Tabs (Carvedilol) .Marland Kitchen... Take 1 tablet by mouth two times a day    Imdur 30 Mg Tb24 (Isosorbide mononitrate) .Marland Kitchen... Take 1 tablet by mouth once a day    Adult Aspirin Ec Low Strength 81 Mg Tbec (Aspirin) .Marland Kitchen... Take 1 by mouth once daily  Orders: Nuclear Stress Test (Nuc Stress Test)  Problem # 3:  ATRIAL FIBRILLATION (ICD-427.31) [name] is maintaining normal sinus rhythm on his current medication regimen of Coreg and dig oxygen. We have kept him on his current medicines and their current doses. His updated medication list for this problem includes:    Coreg 12.5 Mg  Tabs (Carvedilol) .Marland Kitchen... Take 1 tablet by mouth two times a day    Adult Aspirin Ec Low Strength 81 Mg Tbec (Aspirin) .Marland Kitchen... Take 1 by mouth once daily    Digitek 0.25 Mg Tabs (Digoxin) .Marland Kitchen... Take 1 tablet by mouth once a day  Patient Instructions: 1)  Your physician recommends that you schedule a follow-up appointment in: 3 months 2)  Your physician recommends that you continue on your current medications as directed. Please refer to the Current Medication list given to you today. 3)  Your physician has requested that you have an echocardiogram.  Echocardiography is a painless test that uses sound waves to create images of your heart. It provides your doctor with information about the size and shape of your heart and how well your heart's chambers and valves are working.  This procedure takes approximately one hour. There are no restrictions for this procedure. 4)  Your physician has requested that you have a lexiscan myoview.  For further information please visit [email or web address]  Please follow instruction sheet, as given.

## 2010-09-05 NOTE — Assessment & Plan Note (Signed)
Summary: CUT ON LEG/JRT   Vital Signs:  Patient profile:   70 year old male Height:      69 inches Weight:      175 pounds BMI:     25.94 Temp:     98.0 degrees F oral Pulse rate:   72 / minute Pulse rhythm:   regular BP sitting:   110 / 70  (left arm) Cuff size:   large  Vitals Entered By: Benny Lennert CMA Duncan Dull) (August 23, 2010 5:08 PM)  History of Present Illness: Chief complaint cut on left nknee   Immediately prior to arrival at office.. pt walked into a sharp glass sheet when doing house work... bruising and laceration at lateral aspect of left knee.  Problems Prior to Update: 1)  Laceration  (ICD-879.8) 2)  Tia  (ICD-435.9) 3)  Hypoglycemia  (ICD-251.2) 4)  Electrocardiogram, Abnormal  (ICD-794.31) 5)  Shortness of Breath  (ICD-786.05) 6)  Sleep Disorder  (ICD-780.50) 7)  Failure, Systolic Heart, Chronic  (ICD-428.22) 8)  Cardiomyopathy, Ischemic/ CHF  (ICD-414.8) 9)  Coronary Artery Disease  (ICD-414.00) 10)  Atrial Fibrillation  (ICD-427.31) 11)  Hyperlipidemia  (ICD-272.4) 12)  Benign Prostatic Hypertrophy  (ICD-600.00) 13)  Seborrhea  (ICD-706.3) 14)  Cva 2004  (ICD-434.91) 15)  Emphysema  (ICD-492.8) 16)  Neuropathy  (ICD-355.9) 17)  Diabetes Mellitus, Type II, 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 Take 1 Tablet By Mouth Once A Day Before Breakfast 2)  Lantus 100 Unit/ml  Soln (Insulin Glargine) .... Inject 20 Units Each Morning 3)  Coreg 12.5 Mg  Tabs (Carvedilol) .... Take 1 Tablet By Mouth Two Times A Day 4)  Imdur 30 Mg  Tb24 (Isosorbide Mononitrate) .... Take 1 Tablet By Mouth Once A Day 5)  Digitek 0.25 Mg Tabs (Digoxin) .... Take 1 Tablet By Mouth Once A Day 6)  Furosemide 40 Mg Tabs (Furosemide) .Marland Kitchen.. 1 Daily By Mouth 7)  Lorazepam 0.5 Mg Tabs (Lorazepam) .... 1/2 - 1 At Bedtime As Needed To Help  Sleep 8)  Lipitor 20 Mg Tabs (Atorvastatin Calcium) .... Take One By Mouth Daily 9)  Quinapril Hcl 10 Mg Tabs (Quinapril Hcl) .... Take 1 By Mouth Two Times A Day 10)  Adult Aspirin Ec Low Strength 81 Mg Tbec (Aspirin) .... Take 1 By Mouth Once Daily 11)  Vitamin D 1000 Unit Caps (Cholecalciferol) .... Take 1 By Mouth Once Daily 12)  Bd Insulin Syringe Ultrafine 31g X 5/16" 1 Ml Misc (Insulin Syringe-Needle U-100) .... Use As Directed 13)  Truetrack Test  Strp (Glucose Blood) .... Patient Test Blood Sugar 3-5 Times Daily. Dx:250.40 14)  Vitamin-B Complex  Tabs (B Complex Vitamins) .... Take 1 By Mouth Once Daily  Allergies: No Known Drug Allergies  Past History:  Past medical, surgical, family and social histories (including risk factors) reviewed, and no changes noted (except as noted below).  Past Medical History: Reviewed history from 09/07/2009 and no changes required. 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 S/P CABG Endoscopy,  Gastritis , ? H. pylori 08/28/1999 Colonoscopy broad base polyp 09/01/1999 Abd. CT- wnl 3/01 A-fib,shock  then ablation 1990's Appendectomy 1950's Tonsillectomy Stress- LV dilated EF 40%, past MI  2006  Family History: Reviewed history from 11/11/2006 and no changes required. Dad died @66  Mom died @79  2 brothers 2 sisters DM strong on Mom's side CAD, PVD strong on Dad's side No history of prostate or colon cancer  Social History: Reviewed history from 01/08/2009 and no changes required. Disabled due to heart disease Single--lives alone--11/2008 staying with sister who has Alzheimer's Former Smoker - 1980's Alcohol Use - no  Physical Exam  General:  elderly male in NAD Mouth:   MMM Lungs:  Normal respiratory effort, chest expands symmetrically. Lungs are clear to auscultation, no crackles or wheezes. Heart:  Normal rate and regular rhythm. S1 and S2 normal without gallop, murmur, click, rub or other extra sounds. Pulses:  R and L posterior tibial pulses are full and equal bilaterally  Extremities:  left knee.. contusion with 1.5 cm laceration , irregualr edges at inferior apect   Impression & Recommendations:  Problem # 1:  LACERATION (ICD-879.8) Deep laceration extending into and past subcutaneous layer. Procedure Note: Area irrigated with sterile saline. Cleaned with iodine and water. Conset obtained for procedure. Anesthesia acheived with 5 cc epi and lidocaine.  4-0 Ethilon suture used ... 4 sutures placed intterupted. Hemostasis acheived, minimal bleeding during procedure.  Orders: Lacerat Intermd NHF 0 - 2.5 cm (12041)  Complete Medication List: 1)  Glipizide Xl 10 Mg Tb24 (Glipizide) .... Take 1 tablet by mouth once a day before breakfast 2)  Lantus 100 Unit/ml Soln (Insulin glargine) .... Inject 20 units each morning 3)  Coreg 12.5 Mg Tabs (Carvedilol) .... Take 1 tablet by mouth two times a day 4)  Imdur 30 Mg Tb24 (Isosorbide mononitrate) .... Take 1 tablet by mouth once a day 5)  Digitek 0.25 Mg Tabs (Digoxin) .... Take 1 tablet by mouth once a day 6)  Furosemide 40 Mg Tabs (Furosemide) .Marland Kitchen.. 1 daily by mouth 7)  Lorazepam 0.5 Mg Tabs (Lorazepam) .... 1/2 - 1 at bedtime as needed to help sleep 8)  Lipitor 20 Mg Tabs (Atorvastatin calcium) .... Take one by mouth daily 9)  Quinapril Hcl 10 Mg Tabs (Quinapril hcl) .... Take 1 by mouth two times a day 10)  Adult Aspirin Ec Low Strength 81 Mg Tbec (Aspirin) .... Take 1 by mouth once daily 11)  Vitamin D 1000 Unit Caps (Cholecalciferol) .... Take 1 by mouth once daily 12)  Bd Insulin Syringe Ultrafine 31g X 5/16" 1 Ml Misc (Insulin syringe-needle u-100) .... Use as directed 13)  Truetrack Test Strp  (Glucose blood) .... Patient test blood sugar 3-5 times daily. dx:250.40 14)  Vitamin-b Complex Tabs (B complex vitamins) .... Take 1 by mouth once daily  Other Orders: TD Toxoids IM 7 YR + (16109) Admin 1st Vaccine (60454)  Patient Instructions: 1)  Clean wound with warm soapy water. Apply bandaid and neosporin daily. 2)   Apply ice, elevate leg. 3)  call if redness, erythema, fever. 4)  return in 7 days for suture removal.   Orders Added: 1)  TD Toxoids IM 7 YR + [90714] 2)  Admin 1st Vaccine [90471] 3)  Lacerat Intermd NHF 0 - 2.5 cm [12041]   Immunizations Administered:  Tetanus Vaccine:    Vaccine Type: Td    Site: left deltoid    Mfr: Sanofi Pasteur    Dose: 0.5 ml    Route: IM    Given by: Benny Lennert CMA (AAMA)    Exp. Date: 11/21/2011    Lot #: [id number]    VIS given: 06/21/08  version given August 23, 2010.    Immunizations Administered:  Tetanus Vaccine:    Vaccine Type: Td    Site: left deltoid    Mfr: Sanofi Pasteur    Dose: 0.5 ml    Route: IM    Given by: Benny Lennert CMA (AAMA)    Exp. Date: 11/21/2011    Lot #: [id number]    VIS given: 06/21/08 version given August 23, 2010.

## 2010-09-05 NOTE — Consult Note (Signed)
Summary: Diabetes program/[facility]  Diabetes program/[facility]   Imported By: Lester Komatke 01/19/2010 11:01:23  _____________________________________________________________________  External Attachment:    Type:   Image     Comment:   External Document  Appended Document: Diabetes program/[facility] started at clinic counselling and education about diet and insulin use given

## 2010-09-05 NOTE — Progress Notes (Signed)
Summary: RESULTS   Phone Note Call from Patient Call back at Home Phone [phone]   Caller: SELF Call For: [facility] Summary of Call: WOULD LIKE TO KNOW THE RESULTS OF THE ECHO  Initial call taken by: Harlon Flor,  September 27, 2009 11:41 AM  Follow-up for Phone Call        left message on answering machine with results.  Follow-up by: Charlena Cross, RN, BSN,  October 01, 2009 9:09 AM

## 2010-09-05 NOTE — Assessment & Plan Note (Signed)
Summary: ACU FOR DIABETES CHECK-UP   Vital Signs:  Patient profile:   70 year old male Weight:      204 pounds Temp:     98.2 degrees F oral Pulse rate:   64 / minute Pulse rhythm:   regular BP sitting:   110 / 60  (left arm) Cuff size:   large  Vitals Entered By: Mervin Hack CMA Duncan Dull) (Dec 20, 2009 2:47 PM) CC: diabetes   History of Present Illness: Doing fairly well Recent negative cardiac work up  Still having trouble sleeping--gets cold and bad dreams with chest pains if lies on left side Sleep is somewhat better recently though Gets dreams of insecurity--recognizes that he is not intelligent and worries about his limitations (had job problems in the past due to lack of understanding).  Not really anhedonic Enjoys solitude doesn't seem to be depressed per se  diabetes has been a problem thinks he is on too much insulin now Has had some "bad lows"--- rescue had to be called by sister yesterday (58) May have missed meals when this has happened Fasting sugars as low as 70--does tend to have lows in evening  Allergies: No Known Drug Allergies  Past History:  Past medical, surgical, family and social histories (including risk factors) reviewed for relevance to current acute and chronic problems.  Past Medical History: Reviewed history from 09/07/2009 and no changes required. 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 S/P CABG Endoscopy,  Gastritis , ? H. pylori 08/28/1999 Colonoscopy broad base polyp 09/01/1999 Abd. CT- wnl 3/01 A-fib,shock  then ablation 1990's Appendectomy 1950's Tonsillectomy Stress- LV dilated EF 40%, past MI 2006  Family History: Reviewed history from 11/11/2006 and no changes  required. Dad died @66  Mom died @79  2 brothers 2 sisters DM strong on Mom's side CAD, PVD strong on Dad's side No history of prostate or colon cancer  Social History: Reviewed history from 01/08/2009 and no changes required. Disabled due to heart disease Single--lives alone--11/2008 staying with sister who has Alzheimer's Former Smoker - 1980's Alcohol Use - no   Impression & Recommendations:  Problem # 1:  HYPOGLYCEMIA (ICD-251.2) Assessment New  A1c was up to 9.1% but it is more dangerous for him to have these low sugar reactions Has needed rescue squad 3 times since last visit  will stop evening glipizide decrease premeal rapid insulin  counselled 15 minutes --entire viist  Orders: TLB-A1C / Hgb A1C (Glycohemoglobin) (83036-A1C) Venipuncture (16109) Diabetic Clinic Referral (Diabetic)  Complete Medication List: 1)  Quinapril Hcl 10 Mg Tabs (Quinapril hcl) .... Take 1 tablet by mouth twice a day 2)  Glipizide Xl 10 Mg Tb24 (Glipizide) .... Take 1 tablet by mouth once a day before breakfast 3)  Lantus 100 Unit/ml Soln (Insulin glargine) .... 40  units at bedtime 4)  Humulin R 100 Unit/ml Inj Soln (Insulin regular human) .... Inject 36  units three times a day before meals 5)  Coreg 12.5 Mg Tabs (Carvedilol) .... Take 1 tablet by mouth two times a day 6)  Imdur 30 Mg Tb24 (Isosorbide mononitrate) .... Take 1 tablet by mouth once a day 7)  Digitek 0.25 Mg Tabs (Digoxin) .... Take 1 tablet by mouth once a day 8)  Furosemide 40 Mg Tabs (Furosemide) .Marland Kitchen.. 1 daily by mouth 9)  Adult Aspirin Ec Low  Strength 81 Mg Tbec (Aspirin) .... Take 1 by mouth once daily 10)  Vitamin D 1000 Unit Caps (Cholecalciferol) .... Take 1 by mouth once daily 11)  Bd Insulin Syringe Ultrafine 31g X 5/16" 1 Ml Misc (Insulin syringe-needle u-100) .... Use as directed 12)  Triamcinolone Acetonide 0.1 % Crea (Triamcinolone acetonide) .... Apply two times a day to rash as needed 13)  Truetrack Test Strp  (Glucose blood) .... Patient test blood sugar 3-5 times daily. dx:250.40 14)  Vitamin-b Complex Tabs (B complex vitamins) .... Take 1 by mouth once daily 15)  Vitamin E 1000 Unit Caps (Vitamin e) .... Take 1 by mouth once daily 16)  Lorazepam 0.5 Mg Tabs (Lorazepam) .... 1/2 - 1 at bedtime as needed to help sleep 17)  Pravastatin Sodium 80 Mg Tabs (Pravastatin sodium) .... Take 1 by mouth once daily  Patient Instructions: 1)  Keep June 13th appt at 3:30PM 2)  Please decrease the premeal insulin to 36 units 3)  take the glipizide only before breakfast (no evening dose) 4)  Referral Appointment Information 5)  Day/Date: 6)  Time: 7)  Place/MD: 8)  Address: 9)  Phone/Fax: 10)  Patient given appointment information. Information/Orders faxed/mailed.   Current Allergies (reviewed today): No known allergies

## 2010-09-05 NOTE — Progress Notes (Signed)
Summary: Blood sugar  Phone Note Call from Patient   Caller: patient in office Call For: Cindee Salt MD Summary of Call: patient left letter stating that Dr. Alphonsus Sias want's him to take 40 units of Lantis. Pt states he has been taking 38 units and that his BS has been dropping to low in the morning. Initial call taken by: Mervin Hack CMA [facility] Dull),  January 14, 2010 5:58 PM  Follow-up for Phone Call        Per Dr. Alphonsus Sias ok to decrease to 38 units. If still low in the mornings decrease to 35units may be better with less regular. Follow-up by: Mervin Hack CMA Duncan Dull),  January 14, 2010 5:59 PM  Additional Follow-up for Phone Call Additional follow up Details #1::        Spoke with patient and advised results.  Additional Follow-up by: Mervin Hack CMA (AAMA),  January 14, 2010 6:00 PM

## 2010-09-05 NOTE — Progress Notes (Signed)
Summary: refill request for lanoxin  Phone Note Refill Request Message from:  Fax from Pharmacy  Refills Requested: Medication #1:  DIGITEK 0.25 MG TABS Take 1 tablet by mouth once a day   Last Refilled: 07/09/2010 Faxed request from [facility] is on your desk.  Initial call taken by: Lowella Petties CMA, AAMA,  August 13, 2010 2:21 PM  Follow-up for Phone Call        Rx faxed to pharmacy Follow-up by: Mervin Hack CMA Duncan Dull),  August 13, 2010 2:30 PM    Prescriptions: DIGITEK 0.25 MG TABS (DIGOXIN) Take 1 tablet by mouth once a day  #30 x 12   Entered by:   Mervin Hack CMA (AAMA)   Authorized by:   Cindee Salt MD   Signed by:   Mervin Hack CMA (AAMA) on 08/13/2010   Method used:   Electronically to        [facility] (retail)       [address]       [place], [place]  [address]       Ph: [phone]       Fax: [phone]   RxID:   [id number]

## 2010-09-05 NOTE — Assessment & Plan Note (Signed)
Summary: Cardiology Nuclear Study  Nuclear Med Background Indications for Stress Test: Evaluation for Ischemia, Graft Patency   History: Ablation, CABG, Cardioversion, COPD, Echo, Emphysema, Heart Catheterization, Myocardial Infarction, Myocardial Perfusion Study  History Comments: '80's IWMI; '96 CABG x 5; '90's Ablation: (A. Fib); '96 Cardioversion; '01 HKV:QQVZDGL in RCA, no ischemia, EF=53%; '08 Echo:EF=55-65%; h/o CHF/ICM, OSA  Symptoms: Chest Tightness, DOE, Fatigue, SOB  Symptoms Comments: Last episode of OV:FIEP night; only when lying down after 30-minutes.   Nuclear Pre-Procedure Cardiac Risk Factors: CVA, History of Smoking, Hypertension, IDDM Type 2, Lipids Caffeine/Decaff Intake: none NPO After: 10:00 PM Lungs: Clear IV 0.9% NS with Angio Cath: 20g     IV Site: (R) Forearm IV Started by: Stanton Kidney EMT-P Chest Size (in) 46     Height (in): 69 Weight (lb): 208 BMI: 30.83 Tech Comments: CBG= 167 @ 4am, 194 @ 8:30 am this day, per Patient.  Nuclear Med Study 1 or 2 day study:  1 day     Stress Test Type:  Eugenie Birks Reading MD:  Charlton Haws, MD     Referring MD:  Julien Nordmann, MD Resting Radionuclide:  Technetium 1m Tetrofosmin     Resting Radionuclide Dose:  11.0 mCi  Stress Radionuclide:  Technetium 64m Tetrofosmin     Stress Radionuclide Dose:  33.0 mCi   Stress Protocol   Lexiscan: 0.4 mg   Stress Test Technologist:  Rea College CMA-N     Nuclear Technologist:  Burna Mortimer Deal RT-N  Rest Procedure  Myocardial perfusion imaging was performed at rest 45 minutes following the intravenous administration of Myoview Technetium 97m Tetrofosmin.  Stress Procedure  The patient received IV Lexiscan 0.4 mg over 15-seconds.  Myoview injected at 30-seconds.  There were no significant changes with lexiscan.  Quantitative spect images were obtained after a 45 minute delay.  QPS Raw Data Images:  Normal; no motion artifact; normal heart/lung ratio. Stress Images:   IMI Rest Images:  IMI Subtraction (SDS):  scar inferior wall Transient Ischemic Dilatation:  1.19  (Normal <1.22)  Lung/Heart Ratio:  .42  (Normal <0.45)  Quantitative Gated Spect Images QGS EDV:  148 ml QGS ESV:  84 ml QGS EF:  44 % QGS cine images:  Inferior hypokinesis  Findings Low risk nuclear study  Evidence for inferior infarct  Evidence for LV Dysfunction LV Dysfunction    Overall Impression  Exercise Capacity: Lexiscan BP Response: Normal blood pressure response. Clinical Symptoms: nausea ECG Impression: No significant ST segment change suggestive of ischemia. Overall Impression: Moderate IMI from apex to base with no ischemia  Appended Document: Cardiology Nuclear Study pt aware. Charlena Cross RN BSN

## 2010-09-05 NOTE — Letter (Signed)
Summary: [facility]  [facility]   Imported By: Lanelle Bal 01/29/2010 12:53:40  _____________________________________________________________________  External Attachment:    Type:   Image     Comment:   External Document

## 2010-09-05 NOTE — Progress Notes (Signed)
Summary: Diet  Phone Note Call from Patient Call back at Home Phone [phone]   Caller: Patient Call For: Cindee Salt MD Summary of Call: pt would like to know if he can go on a diet of 1600-1800 calories? pt is diabetic and would like to know? Initial call taken by: Mervin Hack CMA (AAMA),  August 09, 2009 9:22 AM  Follow-up for Phone Call        1600 seems a bit low to me but 1800 to 2000 most likely ok.  I would wait until Dr. Alphonsus Sias returns in a few days to check with him as well. Follow-up by: Ruthe Mannan MD,  August 09, 2009 10:46 AM  Additional Follow-up for Phone Call Additional follow up Details #1::        Advised pt, he will wait for Dr. Alphonsus Sias to return to see what he says.                       Lowella Petties CMA  August 09, 2009 2:28 PM   Okay to try 1800 calorie diet but should aim for weight loss of no more than 1# per week on average Additional Follow-up by: Cindee Salt MD,  August 10, 2009 2:12 PM    Additional Follow-up for Phone Call Additional follow up Details #2::    Spoke with patient and advised results.  Follow-up by: Mervin Hack CMA Duncan Dull),  August 13, 2009 9:36 AM

## 2010-09-05 NOTE — Progress Notes (Signed)
Summary: Surgery  Phone Note From Other Clinic   Caller: Dr. Lynnell Dike, [facility] [phone] Call For: Dr. Alphonsus Sias Summary of Call: Dr. Lynnell Dike would like to send pt for cataract surgery and pt would like your opinion of going off blood thinner for surgery? Dr. Lynnell Dike would like to send pt to Dr. Pricilla Holm in [place], and he states Dr. Darel Hong doesn't normally cut thru any vessels, so he doesn't think pt would need to go off blood thinners. Please advise Initial call taken by: DeShannon Katrinka Blazing CMA Duncan Dull),  August 29, 2009 3:20 PM  Follow-up for Phone Call        It would be okay to stop the coumadin for 3 or even 5 days prior to the surgery if that were advisable Follow-up by: Cindee Salt MD,  August 29, 2009 7:05 PM  Additional Follow-up for Phone Call Additional follow up Details #1::        spoke with nurse and advised results, pt is not on coumadin he's taking 81mg  aspirin. DeShannon Smith CMA Duncan Dull)  August 30, 2009 8:40 AM   Okay to stop aspirin for 1 week before procedure if desired by surgeon Additional Follow-up by: Cindee Salt MD,  August 30, 2009 9:35 AM

## 2010-09-05 NOTE — Letter (Signed)
Summary: [facility]  [facility]   Imported By: Lanelle Bal 01/08/2010 10:17:59  _____________________________________________________________________  External Attachment:    Type:   Image     Comment:   External Document  Appended Document: [facility] admitted with apparent TIA still on just the aspirin has follow up next Monday he does feel back to his usual self now

## 2010-09-05 NOTE — Assessment & Plan Note (Signed)
Summary: ROA FOR 1 MONTH FOLLOW-UP/JRR   Vital Signs:  Patient profile:   70 year old male Weight:      190.38 pounds Temp:     97.9 degrees F oral Pulse rate:   76 / minute Pulse rhythm:   regular BP sitting:   110 / 66  (left arm) Cuff size:   large  Vitals Entered By: Sydell Axon LPN (February 25, 2010 2:29 PM) CC: One month follow-up   History of Present Illness: No more spells No dizziness of note---very slight (like when he walks out into the bright sunlight) No hypoglycemic spells  Has cut out all the regular insulin Takes 20 units of lantus in the morning still checking sugars 4 times per day Fasting mostly under 100 and as low as 65 Postprandial are still mostly under 200    Allergies: No Known Drug Allergies  Past History:  Past medical, surgical, family and social histories (including risk factors) reviewed for relevance to current acute and chronic problems.  Past Medical History: Reviewed history from 09/07/2009 and no changes required. 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 S/P CABG Endoscopy,  Gastritis , ? H. pylori 08/28/1999 Colonoscopy broad base polyp 09/01/1999 Abd. CT- wnl 3/01 A-fib,shock  then ablation 1990's Appendectomy 1950's Tonsillectomy Stress- LV dilated EF 40%, past MI 2006  Family History: Reviewed history from 11/11/2006 and no changes required. Dad died @66  Mom died @79  2 brothers 2 sisters DM strong on Mom's side CAD, PVD strong on Dad's side No history of prostate or colon cancer  Social History: Reviewed history from 01/08/2009 and no changes required. Disabled due to heart disease Single--lives alone--11/2008 staying with sister who has Alzheimer's Former  Smoker - 1980's Alcohol Use - no  Review of Systems       eating much better has lost another 6# and feels good some headaches from sinus--tylenol helps  Physical Exam  General:  alert and normal appearance.   Psych:  normally interactive, good eye contact, not anxious appearing, and not depressed appearing.     Impression & Recommendations:  Problem # 1:  HYPOGLYCEMIA (ICD-251.2) Assessment Improved counselled all of 15 minute visit Now off all rapid acting insulin lantus dose cut in half sugars still on low side---discussed that we would further decrease the lantus if AM sugars  Complete Medication List: 1)  Glipizide Xl 10 Mg Tb24 (Glipizide) .... Take 1 tablet by mouth once a day before breakfast 2)  Lantus 100 Unit/ml Soln (Insulin glargine) .... Inject 20 units each morning 3)  Coreg 12.5 Mg Tabs (Carvedilol) .... Take 1 tablet by mouth two times a day 4)  Imdur 30 Mg Tb24 (Isosorbide mononitrate) .... Take 1 tablet by mouth once a day 5)  Digitek 0.25 Mg Tabs (Digoxin) .... Take 1 tablet by mouth once a day 6)  Furosemide 40 Mg Tabs (Furosemide) .Marland Kitchen.. 1 daily by mouth 7)  Lorazepam 0.5 Mg Tabs (Lorazepam) .... 1/2 - 1 at bedtime as needed to help sleep 8)  Pravastatin Sodium 80 Mg Tabs (Pravastatin sodium) .... Take 1 by mouth once daily 9)  Quinapril Hcl 10 Mg Tabs (Quinapril hcl) .... Take 1 by mouth two times a day 10)  Adult Aspirin Ec Low Strength 81 Mg Tbec (Aspirin) .... Take 1 by mouth once daily 11)  Vitamin  D 1000 Unit Caps (Cholecalciferol) .... Take 1 by mouth once daily 12)  Bd Insulin Syringe Ultrafine 31g X 5/16" 1 Ml Misc (Insulin syringe-needle u-100) .... Use as directed 13)  Truetrack Test Strp (Glucose blood) .... Patient test blood sugar 3-5 times daily. dx:250.40 14)  Vitamin-b Complex Tabs (B complex vitamins) .... Take 1 by mouth once daily 15)  Vitamin E 1000 Unit Caps (Vitamin e) .... Take 1 by mouth once daily  Patient Instructions: 1)  If  you continue to have low sugar reactions in the morning, please decrease the lanuts to 15 units daily 2)  Please schedule a follow-up appointment in 4-6  months .   Current Allergies (reviewed today): No known allergies

## 2010-09-05 NOTE — Assessment & Plan Note (Signed)
Summary: 6 MONTH FOLLOW UP/RBH   Vital Signs:  Patient profile:   70 year old male Weight:      196 pounds Temp:     98.3 degrees F oral Pulse rate:   68 / minute Pulse rhythm:   regular BP sitting:   108 / 60  (left arm) Cuff size:   large  Vitals Entered By: Mervin Hack CMA Duncan Dull) (January 14, 2010 3:56 PM) CC: 6 month follow-up   History of Present Illness: went with brother to their sister's house He was putting up birdhouse Looked up into bright sunshine----"everything went white"---both eyes had been dizzy earlier in the AM then sight came back over a couple of minutes then felt dizzy again--better after sitting down No fall or LOC Felt "drunk" on the steps inside sugar okay mild headache during this  Sibs brought him to ER He felt normal by the time he got there  admitted to [facility] records reviewed MRI and carotids negative  Has not had recurrence of this sensation Has had mild lightheadedness with full extension of neck in past---never this bad though  still with hypoglycemic spells despite the decrease in meds off evening glipizide not needing much regular insulin now has gotten some counselling from dietician  Allergies: No Known Drug Allergies  Past History:  Past medical, surgical, family and social histories (including risk factors) reviewed, and no changes noted (except as noted below).  Past Medical History: Reviewed history from 09/07/2009 and no changes required. 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 S/P CABG Endoscopy,  Gastritis , ? H. pylori 08/28/1999 Colonoscopy broad base polyp 09/01/1999 Abd. CT- wnl 3/01 A-fib,shock  then ablation 1990's Appendectomy  1950's Tonsillectomy Stress- LV dilated EF 40%, past MI 2006  Family History: Reviewed history from 11/11/2006 and no changes required. Dad died @66  Mom died @79  2 brothers 2 sisters DM strong on Mom's side CAD, PVD strong on Dad's side No history of prostate or colon cancer  Social History: Reviewed history from 01/08/2009 and no changes required. Disabled due to heart disease Single--lives alone--11/2008 staying with sister who has Alzheimer's Former Smoker - 1980's Alcohol Use - no  Review of Systems       no weakness no trouble speaking or swallowing sleeping better now appetite is okay weight down 8# with improvement in eating  Physical Exam  General:  alert and normal appearance.   Neck:  supple, no masses, no thyromegaly, and no cervical lymphadenopathy.   Lungs:  normal respiratory effort and normal breath sounds.   Heart:  normal rate, regular rhythm, no murmur, and no gallop.   Neurologic:  alert & oriented X3, cranial nerves II-XII intact, strength normal in all extremities, gait normal, and Romberg negative.   Psych:  normally interactive, good eye contact, not anxious appearing, and not depressed appearing.     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 Take 1 by mouth once daily  Problem # 2:  HYPOGLYCEMIA (ICD-251.2) Assessment: Improved better with decreased  meds discussed lowering premeal regular also  Complete Medication List: 1)  Glipizide Xl 10 Mg Tb24 (Glipizide) .... Take 1 tablet by mouth once a day before breakfast 2)  Lantus 100 Unit/ml Soln (Insulin glargine) .... 40  units at bedtime 3)  Humulin R 100 Unit/ml Inj Soln  (Insulin regular human) .... Inject 15  units three times a day before meals if sugar under 250, use 25 units if over 250 4)  Coreg 12.5 Mg Tabs (Carvedilol) .... Take 1 tablet by mouth two times a day 5)  Imdur 30 Mg Tb24 (Isosorbide mononitrate) .... Take 1 tablet by mouth once a day 6)  Digitek 0.25 Mg Tabs (Digoxin) .... Take 1 tablet by mouth once a day 7)  Furosemide 40 Mg Tabs (Furosemide) .Marland Kitchen.. 1 daily by mouth 8)  Lorazepam 0.5 Mg Tabs (Lorazepam) .... 1/2 - 1 at bedtime as needed to help sleep 9)  Pravastatin Sodium 80 Mg Tabs (Pravastatin sodium) .... Take 1 by mouth once daily 10)  Quinapril Hcl 10 Mg Tabs (Quinapril hcl) .... Take 1 by mouth two times a day 11)  Adult Aspirin Ec Low Strength 81 Mg Tbec (Aspirin) .... Take 1 by mouth once daily 12)  Vitamin D 1000 Unit Caps (Cholecalciferol) .... Take 1 by mouth once daily 13)  Bd Insulin Syringe Ultrafine 31g X 5/16" 1 Ml Misc (Insulin syringe-needle u-100) .... Use as directed 14)  Truetrack Test Strp (Glucose blood) .... Patient test blood sugar 3-5 times daily. dx:250.40 15)  Vitamin-b Complex Tabs (B complex vitamins) .... Take 1 by mouth once daily 16)  Vitamin E 1000 Unit Caps (Vitamin e) .... Take 1 by mouth once daily  Patient Instructions: 1)  Please schedule a follow-up appointment in 1 month.   Current Allergies (reviewed today): No known allergies

## 2010-09-05 NOTE — Letter (Signed)
Summary: [facility]  [facility]   Imported By: Lanelle Bal 09/24/2009 13:29:11  _____________________________________________________________________  External Attachment:    Type:   Image     Comment:   External Document  Appended Document: [facility]     Clinical Lists Changes  Observations: Added new observation of DIAB EYE EX: No diabetic retinopathy.    (08/27/2009 13:54)       Diabetic Eye Exam  Procedure date:  08/27/2009  Findings:      No diabetic retinopathy.

## 2010-09-05 NOTE — Assessment & Plan Note (Signed)
Summary: STITCHES REMOVED / LFW   Vital Signs:  Patient profile:   70 year old male Height:      69 inches Weight:      183 pounds BMI:     27.12 Temp:     98.4 degrees F oral Pulse rate:   72 / minute Pulse rhythm:   regular  Vitals Entered By: Benny Lennert CMA Duncan Dull) (August 30, 2010 4:05 PM)  History of Present Illness: Chief complaint remove stitches  Removed 4 stitches from patient knee and placed three stri strips  Area looks healing well, dry, no pus, no redness.No pain per pt. Contusion of lateral knee improving.   No further treatment needed.  Continue washing with warm soapy water two times a day.   Allergies (verified): No Known Drug Allergies   Complete Medication List: 1)  Glipizide Xl 10 Mg Tb24 (Glipizide) .... Take 1 tablet by mouth once a day before breakfast 2)  Lantus 100 Unit/ml Soln (Insulin glargine) .... Inject 20 units each morning 3)  Coreg 12.5 Mg Tabs (Carvedilol) .... Take 1 tablet by mouth two times a day 4)  Imdur 30 Mg Tb24 (Isosorbide mononitrate) .... Take 1 tablet by mouth once a day 5)  Digitek 0.25 Mg Tabs (Digoxin) .... Take 1 tablet by mouth once a day 6)  Furosemide 40 Mg Tabs (Furosemide) .Marland Kitchen.. 1 daily by mouth 7)  Lorazepam 0.5 Mg Tabs (Lorazepam) .... 1/2 - 1 at bedtime as needed to help sleep 8)  Lipitor 20 Mg Tabs (Atorvastatin calcium) .... Take one by mouth daily 9)  Quinapril Hcl 10 Mg Tabs (Quinapril hcl) .... Take 1 by mouth two times a day 10)  Adult Aspirin Ec Low Strength 81 Mg Tbec (Aspirin) .... Take 1 by mouth once daily 11)  Vitamin D 1000 Unit Caps (Cholecalciferol) .... Take 1 by mouth once daily 12)  Bd Insulin Syringe Ultrafine 31g X 5/16" 1 Ml Misc (Insulin syringe-needle u-100) .... Use as directed 13)  Truetrack Test Strp (Glucose blood) .... Patient test blood sugar 3-5 times daily. dx:250.40 14)  Vitamin-b Complex Tabs (B complex vitamins) .... Take 1 by mouth once daily  Other Orders: No Charge Patient  Arrived (NCPA0) (NCPA0)   Orders Added: 1)  No Charge Patient Arrived (NCPA0) [NCPA0]    Current Allergies (reviewed today): No known allergies

## 2010-09-05 NOTE — Progress Notes (Signed)
Summary: lorazepam  Phone Note Refill Request Message from:  Fax from Pharmacy on May 07, 2010 11:40 AM  Refills Requested: Medication #1:  LORAZEPAM 0.5 MG TABS 1/2 - 1 at bedtime as needed to help sleep   Last Refilled: 04/15/2010 [facility] [phone]. Form is on your desk.   Initial call taken by: Melody Comas,  May 07, 2010 11:42 AM  Follow-up for Phone Call        okay #30 x 2 Follow-up by: Cindee Salt MD,  May 07, 2010 1:59 PM  Additional Follow-up for Phone Call Additional follow up Details #1::        Rx faxed to pharmacy Additional Follow-up by: DeShannon Smith CMA Duncan Dull),  May 07, 2010 2:58 PM    Prescriptions: LORAZEPAM 0.5 MG TABS (LORAZEPAM) 1/2 - 1 at bedtime as needed to help sleep  #30 x 2   Entered by:   Mervin Hack CMA (AAMA)   Authorized by:   Cindee Salt MD   Signed by:   Mervin Hack CMA (AAMA) on 05/07/2010   Method used:   Handwritten   RxID:   [id number]

## 2010-09-05 NOTE — Progress Notes (Signed)
Summary: requests change from lipitor  Phone Note From Pharmacy   Caller: [facility] Summary of Call: Pharmacy has sent a note asking you to change pt from lipitor to a lower cost generic.  Note is on your desk. Initial call taken by: Lowella Petties CMA,  November 15, 2009 3:36 PM  Follow-up for Phone Call        okay to change to pravastatin 80mg  daily if patient agrees  If so, send 1 year Rx set up lipid, hepatic in 4-6 weeks if he does change Follow-up by: Cindee Salt MD,  November 15, 2009 5:39 PM  Additional Follow-up for Phone Call Additional follow up Details #1::        Spoke with patient and advised results. Rx sent to pharmacy and lab appt scheduled Additional Follow-up by: DeShannon Smith CMA Duncan Dull),  November 16, 2009 2:28 PM    New/Updated Medications: PRAVASTATIN SODIUM 80 MG TABS (PRAVASTATIN SODIUM) take 1 by mouth once daily Prescriptions: PRAVASTATIN SODIUM 80 MG TABS (PRAVASTATIN SODIUM) take 1 by mouth once daily  #30 x 12   Entered by:   Mervin Hack CMA (AAMA)   Authorized by:   Cindee Salt MD   Signed by:   Mervin Hack CMA (AAMA) on 11/16/2009   Method used:   Electronically to        [facility] (retail)       [address]       [place], [place]  [address]       Ph: [phone]       Fax: [phone]   RxID:   [id number]

## 2010-09-05 NOTE — Progress Notes (Signed)
Summary: still taking lipitor  Phone Note From Pharmacy   Caller: [facility] Summary of Call: Faxed refill request for lipitor.  Per chart, pt was changed from lipitor to pravastatin in 4/11.  I spoke with pt, he says he never changed and is still taking lipitor. Refills sent to pharmacy, emr updated. Initial call taken by: Lowella Petties CMA,  February 28, 2010 12:13 PM  Follow-up for Phone Call        okay It is going generic soon anyway Follow-up by: Cindee Salt MD,  February 28, 2010 12:36 PM    New/Updated Medications: LIPITOR 20 MG TABS (ATORVASTATIN CALCIUM) take one by mouth daily Prescriptions: LIPITOR 20 MG TABS (ATORVASTATIN CALCIUM) take one by mouth daily  #30 x 11   Entered by:   Lowella Petties CMA   Authorized by:   Cindee Salt MD   Signed by:   Lowella Petties CMA on 02/28/2010   Method used:   Electronically to        [facility] (retail)       [address]       [place], [place]  [address]       Ph: [phone]       Fax: [phone]   RxID:   [id number]   Prior Medications: GLIPIZIDE XL 10 MG  TB24 (GLIPIZIDE) Take 1 tablet by mouth once a day before breakfast LANTUS 100 UNIT/ML  SOLN (INSULIN GLARGINE) Inject 20 units each morning COREG 12.5 MG  TABS (CARVEDILOL) Take 1 tablet by mouth two times a day IMDUR 30 MG  TB24 (ISOSORBIDE MONONITRATE) Take 1 tablet by mouth once a day DIGITEK 0.25 MG TABS (DIGOXIN) Take 1 tablet by mouth once a day FUROSEMIDE 40 MG TABS (FUROSEMIDE) 1 daily by mouth LORAZEPAM 0.5 MG TABS (LORAZEPAM) 1/2 - 1 at bedtime as needed to help sleep LIPITOR 20 MG TABS (ATORVASTATIN CALCIUM) take one by mouth daily QUINAPRIL HCL 10 MG TABS (QUINAPRIL HCL) take 1 by mouth two times a day ADULT ASPIRIN EC LOW STRENGTH 81 MG TBEC (ASPIRIN) take 1 by mouth once daily VITAMIN D 1000 UNIT CAPS (CHOLECALCIFEROL) take 1 by mouth once daily BD INSULIN SYRINGE ULTRAFINE 31G X 5/16"  1 ML MISC (INSULIN SYRINGE-NEEDLE U-100) use as directed TRUETRACK TEST  STRP (GLUCOSE BLOOD) patient test blood sugar 3-5 times daily. dx:250.40 VITAMIN-B COMPLEX  TABS (B COMPLEX VITAMINS) take 1 by mouth once daily VITAMIN E 1000 UNIT CAPS (VITAMIN E) take 1 by mouth once daily Current Allergies: No known allergies

## 2010-09-05 NOTE — Progress Notes (Signed)
Summary: Nuclear Pre-Procedure  Phone Note Outgoing Call Call back at [facility] Phone [phone]   Call placed by: Stanton Kidney, EMT-P,  September 20, 2009 1:44 PM Call placed to: Patient Action Taken: Phone Call Completed Summary of Call: Reviewed information on [employer] Information Sheet (see scanned document for further details).  Spoke with Patient.     Nuclear Med Background Indications for Stress Test: Evaluation for Ischemia, Graft Patency   History: Ablation, CABG, Cardioversion, COPD, Echo, Emphysema, Myocardial Infarction  History Comments: '80's MI CABG '90's Ablation: (A. Fib) '96 Cardioversion '08 Echo: EF=55-65%  Symptoms: Chest Pain, DOE, SOB    Nuclear Pre-Procedure Cardiac Risk Factors: CVA, History of Smoking, Hypertension, IDDM Type 2, Lipids Height (in): 68

## 2010-09-05 NOTE — Assessment & Plan Note (Signed)
Summary: 4 m f/u dlo   Vital Signs:  Patient profile:   70 year old male Weight:      174 pounds BMI:     25.79 Pulse rate:   72 / minute Pulse rhythm:   regular BP sitting:   108 / 60  (left arm) Cuff size:   large  Vitals Entered By: Mervin Hack CMA Duncan Dull) (July 26, 2010 2:24 PM) CC: 4 month follow-up   History of Present Illness: Has really improved his eating with the help of the diabetic counsellor Has lost 16# since last visit checks sugars 3-5 times per day AM sugars generally run high 190-200 at times Occ gets hypoglycemic spells in morning though so he thinks the dose is right Does occ take regular insulin---if >200  Having some sleep problems Gets numbness on side (the side up in air) bad dreams at times Lorazepam does help him  No chest pain Had stable DOE--tries to exercise at [facility] 3-4 times per week No palpitations occ lightheadedness no syncope  Allergies: No Known Drug Allergies  Past History:  Past medical, surgical, family and social histories (including risk factors) reviewed for relevance to current acute and chronic problems.  Past Medical History: Reviewed history from 09/07/2009 and no changes required. 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 S/P CABG Endoscopy,  Gastritis , ? H. pylori 08/28/1999 Colonoscopy broad base polyp 09/01/1999 Abd. CT- wnl 3/01 A-fib,shock  then ablation 1990's Appendectomy 1950's Tonsillectomy Stress- LV dilated EF 40%, past MI 2006  Family History: Reviewed history from 11/11/2006 and no changes required. Dad died @66  Mom died @79  2 brothers 2 sisters DM strong on Mom's side CAD, PVD strong on Dad's side No history of prostate or colon  cancer  Social History: Reviewed history from 01/08/2009 and no changes required. Disabled due to heart disease Single--lives alone--11/2008 staying with sister who has Alzheimer's Former Smoker - 1980's Alcohol Use - no  Review of Systems       tends to be tired---better when he uses the lorazepam stomach fine--only occ indigestion bowels okay  Physical Exam  General:  alert and normal appearance.   Neck:  supple, no masses, no thyromegaly, no carotid bruits, and no cervical lymphadenopathy.   Lungs:  normal respiratory effort, no intercostal retractions, no accessory muscle use, and normal breath sounds.   Heart:  normal rate, regular rhythm, no murmur, and no gallop.   Pulses:  1+ in feet Extremities:  no edema Skin:  no suspicious lesions and no ulcerations.   Psych:  normally interactive, good eye contact, not anxious appearing, and not depressed appearing.    Diabetes Management Exam:    Foot Exam (with socks and/or shoes not present):       Sensory-Pinprick/Light touch:          Left medial foot (L-4): diminished          Left dorsal foot (L-5): diminished          Left lateral foot (S-1): diminished          Right medial foot (L-4): diminished          Right dorsal foot (L-5): diminished          Right lateral foot (S-1): diminished       Inspection:  Left foot: normal          Right foot: normal       Nails:          Left foot: thickened          Right foot: thickened   Impression & Recommendations:  Problem # 1:  DIABETES MELLITUS, TYPE II, WITH RENAL COMPLICATIONS 1970'S (ICD-250.40) Assessment Improved  better control with weight loss s and dietary changes will recheck lab His updated medication list for this problem includes:    Glipizide Xl 10 Mg Tb24 (Glipizide) .Marland Kitchen... Take 1 tablet by mouth once a day before breakfast    Lantus 100 Unit/ml Soln (Insulin glargine) ..... Inject 20 units each morning    Quinapril Hcl 10 Mg Tabs (Quinapril hcl)  .Marland Kitchen... Take 1 by mouth two times a day    Adult Aspirin Ec Low Strength 81 Mg Tbec (Aspirin) .Marland Kitchen... Take 1 by mouth once daily  Labs Reviewed: Creat: 1.4 (07/13/2009)     Last Eye Exam: No diabetic retinopathy.    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 Take 1 tablet by mouth two times a day    Digitek 0.25 Mg Tabs (Digoxin) .Marland Kitchen... Take 1 tablet by mouth once a day    Furosemide 40 Mg Tabs (Furosemide) .Marland Kitchen... 1 daily by mouth    Quinapril Hcl 10 Mg Tabs (Quinapril hcl) .Marland Kitchen... Take 1 by mouth two times a day    Adult Aspirin Ec Low Strength 81 Mg Tbec (Aspirin) .Marland Kitchen... Take 1 by mouth once daily  Problem # 3:  ATRIAL FIBRILLATION (ICD-427.31) Assessment: Unchanged  still regular  His updated medication list for this problem includes:    Coreg 12.5 Mg Tabs (Carvedilol) .Marland Kitchen... Take 1 tablet by mouth two times a day    Digitek 0.25 Mg Tabs (Digoxin) .Marland Kitchen... Take 1 tablet by mouth once a day    Adult Aspirin Ec Low Strength 81 Mg Tbec (Aspirin) .Marland Kitchen... Take 1 by mouth once daily  Orders: T-Digoxin ([address]42-59563) T-CBC w/Diff [phone]) T-Renal Function Panel ([phone]) T-TSH ([phone])  Problem # 4:  HYPERLIPIDEMIA (ICD-272.4) Assessment: Unchanged  no problems with this due for labs  His updated medication list for this problem includes:    Lipitor 20 Mg Tabs (Atorvastatin calcium) .Marland Kitchen... Take one by mouth daily  Labs Reviewed: SGOT: 30 (07/13/2009)   SGPT: 51 (07/13/2009)   HDL:23.80 (07/13/2009), 24.80 (01/18/2009)  LDL:DEL (09/20/2008), 83 (55/73/2202)  Chol:121 (07/13/2009), 113 (01/18/2009)  Trig:221.0 (07/13/2009), 231.0 (01/18/2009)  Orders: Venipuncture  ([address]) T-Hepatic Function ([id number]) T-Lipid Profile (17616-07371)  Complete Medication List: 1)  Glipizide Xl 10 Mg Tb24 (Glipizide) .... Take 1 tablet by mouth once a day before breakfast 2)  Lantus 100 Unit/ml Soln (Insulin glargine) .... Inject 20 units each morning 3)  Coreg 12.5 Mg Tabs (Carvedilol) .... Take 1 tablet by mouth two times a day 4)  Imdur 30 Mg Tb24 (Isosorbide mononitrate) .... Take 1 tablet by mouth once a day 5)  Digitek 0.25 Mg Tabs (Digoxin) .... Take 1 tablet by mouth once a day 6)  Furosemide 40 Mg Tabs (Furosemide) .Marland Kitchen.. 1 daily by mouth 7)  Lorazepam 0.5 Mg Tabs (Lorazepam) .... 1/2 - 1 at bedtime as needed to  help sleep 8)  Lipitor 20 Mg Tabs (Atorvastatin calcium) .... Take one by mouth daily 9)  Quinapril Hcl 10 Mg Tabs (Quinapril hcl) .... Take 1 by mouth two times a day 10)  Adult Aspirin Ec Low Strength 81 Mg Tbec (Aspirin) .... Take 1 by mouth once daily 11)  Vitamin D 1000 Unit Caps (Cholecalciferol) .... Take 1 by mouth once daily 12)  Bd Insulin Syringe Ultrafine 31g X 5/16" 1 Ml Misc (Insulin syringe-needle u-100) .... Use as directed 13)  Truetrack Test Strp (Glucose blood) .... Patient test blood sugar 3-5 times daily. dx:250.40 14)  Vitamin-b Complex Tabs (B complex vitamins) .... Take 1 by mouth once daily  Patient Instructions: 1)  Please stop the vitamin E 2)  Please schedule a follow-up appointment in 6 months .    Orders Added: 1)  Est. Patient Level IV [[address]] 2)  Venipuncture [[address]] 3)  T-Hepatic Function [80076-22960] 4)  T-Lipid Profile [80061-22930] 5)  T-Digoxin [80162-23390] 6)  T-CBC w/Diff [60454-09811] 7)  T-Renal Function Panel [80069-22950] 8)  T-TSH [91478-29562] 9)  T- Hemoglobin A1C [83036-23375]    Current Allergies (reviewed today): No known allergies

## 2010-09-17 ENCOUNTER — Telehealth: Payer: Self-pay | Admitting: Internal Medicine

## 2010-09-25 NOTE — Progress Notes (Signed)
Summary: refill requests for lorazepam, humulin R, triamcinolone  Phone Note Refill Request Message from:  Fax from Pharmacy  Refills Requested: Medication #1:  LORAZEPAM 0.5 MG TABS 1/2 - 1 at bedtime as needed to help sleep   Last Refilled: 08/20/2010  Medication #2:  humulin R   Last Refilled: 05/18/2010  Medication #3:  triamcinolone cream   Last Refilled: 09/21/2008 [facility] are [facility].  Humulin R and triamcinolone are no longer on med list.  Initial call taken by: Lowella Petties CMA, AAMA,  September 17, 2010 3:36 PM  Follow-up for Phone Call        okay lorazepam #30 x 0  okay triamcinolone --check if he still uses it  should be off humulin R--please confirm Follow-up by: Cindee Salt MD,  September 18, 2010 7:44 AM  Additional Follow-up for Phone Call Additional follow up Details #1::        left message on machine at home for patient to return my call.  DeShannon Smith CMA (AAMA)  September 18, 2010 10:14 AM   Patient says that he is using the triamcinolone cream, and he says that is still uses the humulin on occasion when his sugar is high.  Melody Comas  September 18, 2010 10:40 AM  rx's faxed to pharmacy, please see message from [place] above. DeShannon Smith CMA (AAMA)  September 19, 2010 8:07 AM   Okay to send #1 vial  x 1 I would not want him taking any if under 300. See what his schedule is with it and document Thanks Cindee Salt MD  September 19, 2010 12:41 PM   per pt he takes 10units if his blood sugar is over 200, please check instructions on Humlin-R, this is what he was last on when it was stopped. Please advise. DeShannon Smith CMA Duncan Dull)  September 19, 2010 2:40 PM   Okay to fill I would recommend he use 5 units if over 200 and 10 units if over 300. Best to avoid low sugar reactions Cindee Salt MD  September 20, 2010 7:53 AM   Spoke with patient and advised results. rx faxed to pharmacy Additional  Follow-up by: DeShannon Katrinka Blazing CMA Duncan Dull),  September 20, 2010 8:45 AM    New/Updated Medications: TRIAMCINOLONE ACETONIDE 0.1 % CREA (TRIAMCINOLONE ACETONIDE) apply two times a day to rash as needed HUMULIN R 100 UNIT/ML SOLN (INSULIN REGULAR HUMAN) Inject 15  units three times a day before meals if sugar under 250, use 25 units if over 25 HUMULIN R 100 UNIT/ML SOLN (INSULIN REGULAR HUMAN) 5 units if over 200 and 10 units if over 300 Prescriptions: HUMULIN R 100 UNIT/ML SOLN (INSULIN REGULAR HUMAN) 5 units if over 200 and 10 units if over 300  #45mth x 11   Entered by:   Mervin Hack CMA (AAMA)   Authorized by:   Cindee Salt MD   Signed by:   Mervin Hack CMA (AAMA) on 09/20/2010   Method used:   Electronically to        [facility] (retail)       [address]       [place], [place]  [address]       Ph: [phone]       Fax: [phone]   RxID:   [id number] TRIAMCINOLONE ACETONIDE 0.1 % CREA (TRIAMCINOLONE ACETONIDE) apply two times a day to rash as needed  #60gm  x 1   Entered by:   Mervin Hack CMA (AAMA)   Authorized by:   Cindee Salt MD   Signed by:   Mervin Hack CMA (AAMA) on 09/19/2010   Method used:   Handwritten   RxID:   [id number] LORAZEPAM 0.5 MG TABS (LORAZEPAM) 1/2 - 1 at bedtime as needed to help sleep  #30 x 0   Entered by:   Mervin Hack CMA (AAMA)   Authorized by:   Cindee Salt MD   Signed by:   Mervin Hack CMA (AAMA) on 09/19/2010   Method used:   Handwritten   RxID:   [id number]

## 2010-12-06 ENCOUNTER — Other Ambulatory Visit: Payer: Self-pay | Admitting: *Deleted

## 2010-12-06 ENCOUNTER — Telehealth: Payer: Self-pay | Admitting: *Deleted

## 2010-12-06 MED ORDER — GLUCOSE BLOOD VI STRP: ORAL_STRIP | Status: DC

## 2010-12-06 NOTE — Telephone Encounter (Signed)
I sent in a refill for pt's test strips to [facility] per pt's request but he called back and said that his insurance dictates where he can get his scripts filled, and [facility] isnt included.  He is asking for a written script to take so that he can shop around.  Please included dx code on the script.  Please call when ready, knows it will be Monday.

## 2010-12-09 MED ORDER — GLUCOSE BLOOD VI STRP: ORAL_STRIP | Status: DC

## 2010-12-09 NOTE — Telephone Encounter (Signed)
rx printed and advised patient it's ready for pick-up

## 2010-12-09 NOTE — Telephone Encounter (Signed)
Please prepare a written script for me to sign

## 2010-12-17 ENCOUNTER — Other Ambulatory Visit: Payer: Self-pay | Admitting: *Deleted

## 2010-12-17 MED ORDER — LORAZEPAM 0.5 MG PO TABS: ORAL_TABLET | ORAL | Status: DC

## 2010-12-17 NOTE — Telephone Encounter (Signed)
Faxed form from [facility] is on your desk.

## 2010-12-17 NOTE — Assessment & Plan Note (Signed)
[facility]                            CARDIOLOGY OFFICE NOTE   NAME:[name], ABUBAKR WIEMAN                      MRN:          [id number]  DATE:05/26/2007                            DOB:          [birth year]    PRIMARY CARE PHYSICIAN:  Dr. Tillman Abide.   REASON FOR VISIT:  Follow up dizziness and edema.   HISTORY OF PRESENT ILLNESS:  I saw Mr. Baldini back in early September.  He reports that his symptoms of dizziness, although not completely  resolved, have gotten much better.  He also reports a significant  improvement in his lower extremity edema.  He has had some problems with  intermittent indigestion and had not taken anything for this at the  time.  I reviewed his medications today and we talked about backing off  of his Lasix dose even more.  He has gotten a scale and has been  weighing himself at home.  His weight is actually down compared to the  last visit.  He has had some occasional palpitations in the evening when  he lies down.  No syncope.   ALLERGIES:  No known drug allergies.   PRESENT MEDICATIONS:  1. Lipitor 20 mg p.o. daily.  2. Glipizide XL 10 mg p.o. b.i.d.  3. Quinapril 10 mg p.o. b.i.d.  4. Digitek 0.25 mg p.o. daily.  5. Lantus and Humulin as directed.  6. Aspirin 81 mg p.o. daily.  7. Multivitamin daily.  8. Azmacort 2 puffs b.i.d.  9. Imdur 30 mg p.o. b.i.d.  10.Coreg 12.5 mg p.o. b.i.d.  11.Cinnamon.  12.Vitamin B supplements.  13.Spiriva inhaler.  14.Lasix 80 mg alternating with 40 mg every other day.   REVIEW OF SYSTEMS:  As described in the history of present illness.   PHYSICAL EXAMINATION:  Blood pressure is 96/56, heart rate is 58, weight  is 206 pounds, down from 214 in September.  The Patient is in no acute distress.  Examination of the neck reveals no elevated jugular venous pressure, no  loud bruits.  LUNGS:  Clear without labored breathing.  CARDIAC EXAM:  Reveals a regular rate and rhythm without  murmurs, rubs,  or gallops.  ABDOMEN:  Soft.  Bowel sounds present.  EXTREMITIES:  Exhibit no significant pitting edema.   IMPRESSION/RECOMMENDATIONS:  1. Multivessel coronary artery disease with previous inferior wall      myocardial infarction and coronary artery bypass grafting in 1996.      The patient's ejection fraction is normal with inferior/posterior      hypokinesis.  He has mild mitral regurgitation associated with this      on resting echocardiography.  The plan at this point would be      continued medical therapy.  I have asked him to decrease Lasix to      40 mg daily and weigh himself, adjusting Lasix up or down as needed      based on weight gain or edema as we discussed in the past.  He is      not describing any clear angina.  I will plan to see him  back over      the next 3 months.  2. Further plans to follow.     Jonelle Sidle, MD  Electronically Signed    SGM/MedQ  DD: 05/26/2007  DT: 05/27/2007  Job #: [id number]   cc:   Karie Schwalbe, MD

## 2010-12-17 NOTE — Assessment & Plan Note (Signed)
[facility] OFFICE NOTE   NAME:[name], [name] REAUME                      MRN:          [id number]  DATE:09/26/2008                            DOB:          Sep 19, 1940    [name] comes in today for followup.  He has been a former patient of  Sam [name]'s.   He has been having a discomfort in his mid sternal area that awakens him  at night.  Issues associated with bad dreams.  It does not radiate.  It  is not associated with nausea, vomiting, diaphoresis, shortness of  breath, or any symptoms of reflux.  He denies any problems with  swallowing food.  He does have a history of reflux, however.   It has not happened with exertion.  In fact, he cleaned out his garage  the other day and other than some muscle soreness, he has not had any  complaints.   He has a history of ischemic cardiomyopathy.  He has had a previous  inferior wall infarct, coronary bypass surgery in the mid 1990s.  He has  a well-preserved left ventricular ejection fraction with inferoposterior  hypokinesia and mild mitral regurgitation.  He saw Dr. Diona Browner last on  September 20, 2007.   MEDICATIONS:  He is on an extensive medical program.  This outlined in  the chart.  Specifically,  1. Digoxin 0.25 mg per day.  2. Quinapril 10 mg p.o. b.i.d.  3. Lipitor 20 mg a day.  4. Glipizide 10 mg p.o. b.i.d.  5. Lantus as directed.  6. Humulin R as directed.  7. Carvedilol 12.5 mg p.o. b.i.d.  8. Isosorbide mononitrate 30 mg a day.  9. Furosemide 40 mg a day.  10.Aspirin 81 mg a day.   PHYSICAL EXAMINATION:  GENERAL:  He is in no acute distress today. He is  overweight, very pleasant gentleman.  SKIN:  Warm and dry.  VITAL SIGNS:  Respiratory rate 18, unlabored.  HEENT:  Normal except for lack of teeth.  NECK:  Supple.  Carotid upstrokes were equal bilaterally without bruits,  no JVD.  Thyroid is not enlarged.  Trachea is midline.  CHEST/LUNGS:   Clear to auscultation and percussion except for few  crackles in the left distal base.  HEART:  Nondisplaced PMI.  He has a soft systolic murmur at the apex.  No gallop.  No rub.  S2 splits physiologically.  ABDOMEN:  Soft, good bowel sounds.  No epigastric tenderness.  No  pulsatile mass.  No hepatojugular reflux.  No hepatic tenderness.  EXTREMITIES:  No cyanosis, clubbing, or edema.  Pulses are intact.  NEUROLOGIC:  Intact.   EKG shows sinus rhythm, previous inferior wall infarct, left anterior  fascicular block, poor R-wave progression in the anterior precordium all  of which are old.   ASSESSMENT AND PLAN:  Noncardiac chest pain.  [name] has had this for  months, seems to be mostly at night associated with bad dreams.   He is on excellent medical program.  I have made no changes.  Reassurance was given.  We  will see him back in 6 months.     Thomas C. Daleen Squibb, MD, [facility]  Electronically Signed    TCW/MedQ  DD: 09/26/2008  DT: 09/27/2008  Job #: [id number]

## 2010-12-17 NOTE — Assessment & Plan Note (Signed)
[facility]                            CARDIOLOGY OFFICE NOTE   NAME:Cansler, NICOLA QUESNELL                      MRN:          [id number]  DATE:04/12/2007                            DOB:          [birth year]    PRIMARY CARE PHYSICIAN:  Dr. Tillman Abide   REASON FOR VISIT:  Recent episodes of dizziness.   HISTORY OF PRESENT ILLNESS:  Mr. Yi was in the office back in April.  He is referred back now by Dr. Alphonsus Sias having been seen for some episodes  of dizziness and possibly increased congestion.  Mr. Philipson tells me that  approximately 2 weeks ago, he had an episode where he felt dizzy and  unsteady while he was standing in his kitchen and looking up.  He states  that he felt as if the room was closing in on him.  He stepped forward  and was unsteady, falling to the ground, although he did not lose  consciousness.  He states that he was very weak but eventually was able  to resume his regular activities with no specific problems.  He denies  having any symptoms of chest pain at that time and not clearly any  definite palpitations.  He has baseline dyspnea on exertion and reports  that this has not changed, although does note that in the evenings he  feels like fluid is moving around in the chest.  He saw Dr. Alphonsus Sias and  had an increase in Lasix to 80 mg daily from 40 mg alternating with 80  mg every other day and also had the addition of Spiriva.  He reports  that these interventions have improved his symptoms.  He had 1 mild  spell a few days ago.   In reviewing his most recent evaluation, he has actually shown  improvement in his cardiac status with ejection fraction increasing to  the normal range and associated now with only mild mitral regurgitation.  Posterior hypokinesis was noted, and he has a prior history of inferior  infarct.  Electrocardiogram is stable today showing sinus rhythm with a  leftward axis/inferior infarct pattern and nonspecific  ST-T wave  changes.   Orthostatics in the office did not show any significant blood pressure  drop nor increase in heart rate if going from lying, sitting, to  standing positions.  He did report a feeling of lightheadedness and  funny feeling in his eyes when he stood.   ALLERGIES:  No known drug allergies.   PRESENT MEDICATIONS:  1. Lipitor 20 mg p.o. daily.  2. Glipizide XL 10 mg p.o. b.i.d.  3. Quinopril 10 mg p.o. b.i.d.  4. Digitek 0.25 mg p.o. daily.  5. Lantus as directed.  6. Humulin as directed.  7. Aspirin 81 mg p.o. daily.  8. Multivitamin 1 p.o. daily.  9. Azmacort 2 puffs b.i.d.  10.Imdur 30 mg p.o. daily.  11.Coreg 12.5 mg p.o. b.i.d.  12.Lasix 80 mg p.o. daily.  13.Spiriva 1 puff daily.   REVIEW OF SYSTEMS:  As described in history of present illness.   EXAMINATION:  Blood pressure 115/52.  Heart rate  is 83.  Weight is 214  pounds which is down 5 pounds from his last visit.  The patient  overweight, no acute distress.  HEENT:  Conjunctivae was normal.  Oropharynx is clear.  NECK:  Supple.  No loud bruits.  No elevated jugular venous pressure or  thyromegaly.  LUNGS:  Diminished breath sounds, no crackles or wheezing.  Respiratory  effort is nonlabored at rest.  CARDIAC:  Regular rate and rhythm, soft systolic murmur, no S3 gallop  and no pericardial rub.  ABDOMEN:  Nontender.  EXTREMITIES:  No pitting edema today.  There is trace edema, some  venostasis.  Distal pulses 1+.  SKIN:  Warm and dry.  MUSCULOSKELETAL:  No kyphosis is noted.  NEUROPSYCHIATRIC:  The patient alert and oriented x3.   IMPRESSION AND RECOMMENDATIONS:  Recent episodes of dizziness, etiology  not certain.  Seems unlikely that this is purely cardiac.  He is not  orthostatic and does not appear to be markedly volume overloaded.  He  does not note any marked palpitations recently with these episodes but  has had palpitations in the past.  We did talk about an event recorder  today,  although he did not want to proceed with this as yet.  He has had  an improvement with the changes in his medications, and I have asked him  for the time being to cut Lasix back, try to avoid relative volume  contraction.  I have asked him to weigh himself, and we talked about  making his other adjustments in diuretic therapy based on weight  changes.  In addition, I would like to increase his Imdur to 30 mg p.o.  b.i.d. to see if this makes any impact.  He does report a chronic  history of recurrent chest pain and shortness of breath, some of which  could be ischemia mediated, although his most recent objective  assessments show improvement in his left ventricular function and no  marked degree of ischemia.  He may be having intermittent ischemic  mitral regurgitation; could also consider the possibility of neurologic  etiologies.  I will plan to have him come back to see me in the next  month and review his progress.  He will otherwise call us back if he has  progressive symptoms in the interim.  1. Further plans to follow.     Jonelle Sidle, MD  Electronically Signed    SGM/MedQ  DD: 04/12/2007  DT: 04/12/2007  Job #: [id number]   cc:   Karie Schwalbe, MD

## 2010-12-17 NOTE — Assessment & Plan Note (Signed)
[facility]                            CARDIOLOGY OFFICE NOTE   NAME:Osterberg, HAIM HANSSON                      MRN:          [id number]  DATE:09/20/2007                            DOB:          Nov 10, 1940    PRIMARY CARE PHYSICIAN:  Karie Schwalbe, MD   REASON FOR VISIT:  Cardiology followup.   HISTORY OF PRESENT ILLNESS:  Mr. Tomasini is overall doing fairly well.  He  is not having any angina and has NYHA class II dyspnea on exertion.  He  has not been exercising regularly, but walks approximately 100 yards  each day to his mailbox and back.   Electrocardiogram is stable showing sinus rhythm with occasional  premature ventricular complexes, left anterior fascicular block masking  previous inferior infarct and poor anterior R wave progression, all of  which are old.   CURRENT MEDICATIONS:  Are outlined below and he reports being compliant  with these.   He states his weight has been stable and he has had no progressive lower  extremity edema or dizziness.  Lipids back in September of last year  showed LDL of 58 and followup lab work by Dr. Alphonsus Sias.   ALLERGIES:  NO KNOWN DRUG ALLERGIES.   MEDICATIONS:  1. Lipitor 20 mg p.o. daily.  2. Glipizide XL 10 mg p.o. b.i.d.  3. Quinapril 10 mg p.o. b.i.d.  4. Digitek 0.25 mg p.o. daily.  5. Lantus as directed.  6. Humulin as directed.  7. Aspirin 81 mg p.o. daily.  8. Multivitamin one p.o. daily.  9. Azmacort 2 puffs b.i.d.  10.Imdur 30 mg p.o. daily.  11.Coreg 12.5 mg p.o. b.i.d.  12.Cinnamon.  13.Vitamin B supplements.  14.Spiriva inhaler.  15.Lasix 80 mg alternating with 40 mg p.o. every other day.   REVIEW OF SYSTEMS:  As described in History of Present Illness.  No PND  or orthopnea.  Otherwise negative.   PHYSICAL EXAMINATION:  Blood pressure was 105/60, heart rate 62,  weight  is 206 pounds which is stable.  The patient is comfortable and in no  acute distress.  Examination of the neck  reveals no loud carotid bruits.  Lungs are clear without labored breathing, diminished breath sounds at  bases.  Cardiac examination reveals a regular rate and rhythm.  Soft systolic  murmur.  No S3 gallop.  EXTREMITIES:  Exhibit some venous stasis and 1+ edema.   IMPRESSIONS AND RECOMMENDATIONS:  Multivessel coronary artery disease  status post previous inferior wall myocardial infarction with coronary  artery bypass grafting in the mid 1990s.  He has preserved ejection  fraction with inferior posterior hypokinesis and associated mild mitral  regurgitation.  Overall, he has been symptomatically stable and I plan  to continue the present regimen with a followup in 6 months.     Jonelle Sidle, MD  Electronically Signed    SGM/MedQ  DD: 09/20/2007  DT: 09/20/2007  Job #: [id number]   cc:   Karie Schwalbe, MD

## 2010-12-17 NOTE — Telephone Encounter (Signed)
Okay #30 x 1 

## 2010-12-17 NOTE — Telephone Encounter (Signed)
rx faxed to pharmacy manually  

## 2010-12-20 NOTE — Assessment & Plan Note (Signed)
[facility]                            CARDIOLOGY OFFICE NOTE   NAME:[name]                      MRN:          [id number]  DATE:08/10/2006                            DOB:          Feb 19, 1941    PRIMARY CARE PHYSICIAN:  Dr.  Tillman Abide.   REASON FOR VISIT:  Followup cardiomyopathy.   HISTORY OF PRESENT ILLNESS:  I saw [name] back in October.  We made  some medication adjustments at his last visit and he states that he has  actually felt better with less angina and NYHA class II dyspnea.  We  discussed continuing to optimize medical therapy and ultimately repeat  echocardiogram around his next visit.  He has had no palpitations or  syncope.  His electrocardiogram shows a sinus rhythm with evidence of a  previous inferior wall infarct and poor anterior R-wave progression as  noted previously.   ALLERGIES:  No known drug allergies.   PRESENT MEDICATIONS:  1. Lipitor 20 mg p.o. daily.  2. Glipizide XL 10 mg p.o. daily.  3. Quinapril 10 mg p.o. daily.  4. Digitek 0.25 mg p.o. daily.  5. Lantus as directed.  6. Humulin as directed.  7. Aspirin 81 mg p.o. daily.  8. Multivitamin 1 p.o. daily.  9. Azmacort 2 puffs b.i.d.  10.Lasix 40 mg p.o. every other day, alternating with 80 mg p.o. every      other day.  11.Coreg 6.25 mg p.o. b.i.d.  12.Imdur 30 mg p.o. daily.   REVIEW OF SYSTEMS:  As described in the history of present illness.   EXAMINATION:  Weight 215.  Blood pressure 126/72.  Heart rate is 80.  The patient is comfortable and in no acute distress.  HEENT:  Conjunctivae and lids normal.  Oropharynx is clear.  NECK:  Supple without elevated jugular venous pressure or loud bruits.  No thyromegaly is noted.  LUNGS:  Clear without labored breathing.  CARDIAC:  Regular rate and rhythm with a 2-3/6 systolic murmur heard  towards the apex.  No S3 gallop.  ABDOMEN:  Soft, nontender.  Normoactive bowel sounds.  EXTREMITIES:  No  significant pitting edema.   IMPRESSION AND RECOMMENDATIONS:  1. Multivessel coronary artery disease, status post previous inferior      wall myocardial infarction.  Ejection fraction ranging from 40% to      48% based on different testing modalities.  This is also associated      with moderate to severe mitral regurgitation.  We will plan to      increase Coreg to 12.5 mg p.o. b.i.d. and continue his remaining      medications.  I will plan to see him back over the next 3 months      and repeat an echocardiogram at that time to follow up on left      ventricular function and mitral valve status.  2. Further plans to follow.     Jonelle Sidle, MD  Electronically Signed    SGM/MedQ  DD: 08/10/2006  DT: 08/10/2006  Job #: [id number]   cc:   [name]  [name] Bigness, MD

## 2010-12-20 NOTE — Assessment & Plan Note (Signed)
[name]                              CARDIOLOGY OFFICE NOTE   NAME:[name], [name] SHEERIN                      MRN:          [id number]  DATE:05/13/2006                            DOB:          1941/01/15    PRIMARY CARE PHYSICIAN:  Karie Schwalbe, M.D.   REASON FOR VISIT:  Followup, cardiac record review and symptoms.   HISTORY OF PRESENT ILLNESS:  I saw [name] back in late September.  His  history is details in my previous note.  We increased his Lasix to 40 mg  alternating with 80 mg p.o. every other day, and he has noted some  improvement in edema, and, to some degree, shortness of breath.  I reviewed  his testing in [place], and note an ejection fraction calculated at 48%  by echocardiography associated with moderate to severe mitral regurgitation,  mild aortic regurgitation, and mild tricuspid regurgitation.  The myocardial  perfusion study suggested an infarction in the right coronary in the  circumflex territory with left ventricular dilatation and an ejection  fraction of 40%.  There was no evidence of ischemia by this particular  study.  I reviewed these with him today.  At this point, I would anticipate  trying to better optimize his medical regimen and plan symptom review over  the next few months.  We could certainly proceed with additional and basic  testing if need be.  He was most comfortable with this plan.   ALLERGIES:  No known drug allergies.   PRESENT MEDICATIONS:  1. Lipitor 20 mg p.o. daily.  2. Metoprolol 25 mg p.o. b.i.d.  3. Glipizide XL 10 mg p.o. b.i.d.  4. Quinapril 10 mg p.o. b.i.d.  5. Digitek 0.25 mg daily.  6. Lantus as directed.  7. Humulin as directed.  8. Aspirin 81 mg p.o. daily.  9. Multivitamin one p.o. daily.  10.Multivitamin one p.o. daily.  11.Azmacort 2 puffs b.i.d.  12.Lasix 40 mg alternating with 80 mg p.o. every other day   REVIEW OF SYSTEMS:  As described in the history of present  illness.   PHYSICAL EXAMINATION:  VITAL SIGNS:  Weight is 211 pounds.  Blood pressure  is 125/68, heart rate is 66 and regular.  GENERAL:  The patient is comfortable without active chest pain.  NECK:  No obvious jugular venous pressure.  LUNGS:  Clear without labored breathing.  CARDIAC:  Regular rate and rhythm without loud murmur or gallop.  EXTREMITIES:  Trace edema.   IMPRESSION AND RECOMMENDATIONS:  1. Multivitamin coronary artery disease status post previous inferior wall      myocardial infarction with coronary bypass grafting in 1996.  His      recent myocardial perfusion study suggests a scar in the inferolateral      distribution without active ischemia and an ejection fraction of 40%,      although this was calculated at 48% by echocardiography.  At this      point, my plan will be to discontinue metoprolol in favor of Coreg 6.25      mg p.o. b.i.d. and titrate  as tolerated.  Will also add Imdur 30 mg      p.o. daily.  We will continue his increase in Lasix dosing for the time      being.  It may be that some of his symptoms are related to his mitral      valve regurgitation, as well.  We will plan symptom followup over the      next few months, and certainly if he does not progress with medical      therapy adjustments, a left and right heart catheterization would be      considered.  He was comfortable with this.  2. Further plans to follow.       Jonelle Sidle, MD     SGM/MedQ  DD:  05/13/2006  DT:  05/15/2006  Job #:  [id number]   cc:   Karie Schwalbe, MD

## 2010-12-20 NOTE — Assessment & Plan Note (Signed)
[facility]                              CARDIOLOGY OFFICE NOTE   NAME:[name], [name]                      MRN:          [id number]  DATE:                                      DOB:          [birth year]    PRIMARY CARE PHYSICIAN:  Dr. Alphonsus Sias.   REASON FOR VISIT:  Follow up coronary artery disease and congestive heart  failure.   HISTORY OF PRESENT ILLNESS:  [name] is a gentleman with a history of  coronary artery disease, previously followed in our clinic by Dr. Primitivo Gauze,  Dr. Graciela Husbands, and more recently Dr. Daleen Squibb, last seen in 2002.  He has a history  of multi-vessel coronary artery disease, status post inferior wall  myocardial infarction, and ultimately coronary artery bypass grafting by Dr.  Tyrone Sage in 1996.  He has a LIMA to the left anterior descending, saphenous  vein graft to the diagonal, saphenous vein graft to the obtuse marginal, and  saphenous vein graft to the posterior descending branch of the right  coronary artery based on most recent cath data from 1998, at which time  bypass grafts were noted to be patent.  I also see history of atrial  flutter, status post radiofrequency ablation by Dr. Graciela Husbands.  The patient has  not been on Coumadin chronically.  Ejection fraction has ranged from 35% to  51% based on a variety of testing modalities.  Symptomatically, [name]  states that over the last few months he has been noting shortness of breath  classified anywhere from NYHA class 2 to 3, as well as a quivering  sometimes in his chest that is not prolonged.  He is not having any  exertional chest pain.  He does state that he has lower extremity edema.  He  also had some problems with insomnia and nightmares.  He reports a stroke  approximately 1 to 2 years ago, and some memory problems since that time.  He tells me that he saw Dr. Welton Flakes in [place], and underwent a recent  cardiac evaluation including an echocardiogram and a myocardial  perfusion  study.  My understanding is that it was recommended that a cardiac  catheterization be pursued, although the patient decided not to follow up  with that practice and is re-establishing with [place] now.  His  electrocardiogram today shows sinus rhythm with a single premature  ventricular complex, leftward axis, actually consistent with an inferior  wall infarct, and decreased R-wave progression, particularly in the lateral  leads, which is more progressive compared to 2002, although could be  partially a function of lead placement.  He reports compliance with his  medications which are outline below.   ALLERGIES:  NO KNOWN DRUG ALLERGIES.   PRESENT MEDICATIONS:  1. Lipitor 20 mg p.o. daily.  2. Metoprolol 25 mg p.o. b.i.d.  3. Glipizide XL 10 mg p.o. b.i.d.  4. Quinapril 10 mg p.o. b.i.d.  5. Lasix 40 mg p.o. daily.  6. Digitek 0.25 mg p.o. daily.  7. Lantus as directed.  8. Humulin as directed.  9. Aspirin 81 mg p.o. daily.  10.Multivitamin 1 p.o. daily.  11.Azmacort 2 puffs b.i.d.   PAST MEDICAL HISTORY:  As outlined in the history of present illness.  Additional pertinent history includes type 2 diabetes mellitus,  hyperlipidemia, reported COPD with prior tobacco use history, hypertension,  peripheral arterial disease, and prior history of anemia with heme-positive  stools.   SOCIAL HISTORY:  Patient is single.  He has no children.  He reports that he  is disabled and has a prior tobacco use history, having quit in the 1980s.  He denies any alcohol use.   FAMILY HISTORY:  Reviewed and it is noncontributory for premature  cardiovascular disease.   REVIEW OF SYMPTOMS:  As described in history of present illness.  He has  also had fatigue.  He has had no palpitations or syncope.   EXAMINATION:  Blood pressure 112/72,  heart rate 90, weight 209 pounds.  Patient is comfortable and in no acute respiratory distress, denying active  chest pain.  HEENT:  Conjunctivae and  lids normal.  Oropharynx  clear.  NECK:  Supple without elevation of jugular venous pressure.  LUNGS:  Diminished breath sounds but no active rales or wheezing.  CARDIAC:  Regular rate and rhythm with occasional ectopic beat.  No S3  gallop.  No loud systolic murmur.  ABDOMEN:  Soft, no bruit.  EXTREMITIES:  Trace edema, some venous stasis changes.   Recent laboratory data per Dr. Alphonsus Sias includes hemoglobin of 15.4, total  cholesterol 148, LDL cholesterol 88, BUN 19, creatinine 1.1, digoxin level  0.4.   IMPRESSION/RECOMMENDATIONS:  1. Multi-vessel coronary artery disease, status post previous inferior      wall myocardial infarction, as well as coronary artery bypass grafting      as outlined.  He has had evidence of ischemic cardiomyopathy with      ejection fraction ranging from 35% to 50%, based on a variety of      testing.  His last myocardial perfusion study here in 2001 showed      ejection fraction of 53%.  He has had increased dyspnea over the last 2      months and fatigue, and tells me that he underwent an ischemic      evaluation as well as an echocardiogram by Dr. Park Breed in [place]      recently.  We have made arrangements to obtained these records for      review and can then determine if additional testing is required.  He is      not reporting an angina at this time and his medical regimen is fairly      reasonable from a cardiac perspective.  I have asked him to increase      his Lasix to 40 mg alternating with 80 mg every other day, and I will      have him return in the office over the next 2 weeks so that we can      review his      recent testing records from [place], and decide the next step.  2. Further plans to follow.       Jonelle Sidle, MD     SGM/MedQ  DD:  05/01/2006  DT:  05/04/2006  Job #:  [id number]   cc:   Karie Schwalbe, MD

## 2010-12-20 NOTE — Procedures (Signed)
[place]  Patient:    [name], [name]                      MRN: [id number] Proc. Date: 10/02/99 Adm. Date:  [id number] Attending:  Mardella Layman CC:         Jesse Sans. Wall, M.D. [facility]                           Procedure Report  INDICATIONS FOR PROCEDURE:  The patient is a 70 year old white male with multiple medical problems including diabetes, chronic obstructive lung disease, hypertensive cardiovascular disease with hyperlipidemia, chronic atrial flutter, coronary artery disease and previous bypass surgery.  He had trace Hemoccult cards done by Dr. Daleen Squibb, and was referred for evaluation on July 19, 1999.  The patient really  denied any significant gastrointestinal problems.  Outpatient endoscopy on Aug 28, 1999 showed diffuse chronic gastritis of unexplained etiology with a negative CLO biopsy.  It was felt possibly that this was secondary to NSAID damage.  The patient has been placed on acid suppressive  therapy and has been off of salicylates.  Colonoscopy was scheduled to complete his workup.  The risks and benefits of this procedure were explained in detail and e agreed to proceed as planned.  Preoperative cardiopulmonary and mental status exams were unremarkable.  DESCRIPTION OF PROCEDURE:  Throughout this procedure, the patient was on pulse oximetry and cardiac monitorization.  He tolerated this procedure well, receiving supplemental low flow oxygen by nasal cannula throughout the procedure.  He was  anesthetized with 50 mcg of IV fentanyl and 4.5 mg of IV Versed.  Inspection of his rectum was unremarkable, as was rectal exam.  His rectum was intubated easily with the Olympus adult video colonoscope.  This was advanced without difficulty through a well-prepped colon into the cecum.  The base of the cecum, including the ileocecal valve, was unremarkable.  In the mid ascending colon on the lateral wall, there was a  2-2.5 cm flat nodular lesion with some central depression. Multiple pictures were taken for documentation.  The polyp was then removed in a piecemeal manner with  electrocautery snare.  The tips of this polyp were then cauterized  with the monopolar electrode.  The colonoscope was then slowly withdrawn throughout the length of the colon, which otherwise was unremarkable except for a 5 mm flat polyp at the rectosigmoid junction that was removed with the hot biopsy forceps. Cautery used for these events was 20 watts.  A retroflexed view of the rectum was otherwise normal.  He was then extubated without difficulty, tolerated this procedure well.  ASSESSMENT:  This was an unremarkable colonoscopy except for a rather prominent  polyp in the mid ascending colon which is somewhat worrisome because of its size and central depression.  RECOMMENDATIONS: 1. Follow up on pathology results to exclude atypia. 2. Strict post polypectomy orders and precautions. 3. Office follow-up in two weeks time. DD:  10/02/99 TD:  10/02/99 Job: [id number]

## 2010-12-20 NOTE — Assessment & Plan Note (Signed)
[facility]                            CARDIOLOGY OFFICE NOTE   NAME:[name], [name] [place]                      MRN:          [id number]  DATE:11/09/2006                            DOB:          [birth year]    PRIMARY CARE PHYSICIAN:  Dr. Tillman Abide.   REASON FOR VISIT:  Cardiac followup.   HISTORY OF PRESENT ILLNESS:  I saw [name] back in January.  He has  multivessel coronary artery disease with previous inferior wall  myocardial infarction and left ventricular dysfunction.  We have been  managing him medically with up titration of his regimen and he underwent  a recent followup echocardiogram which demonstrated actually an  improvement in left ventricular systolic function to the normal range at  55-65% with posterior hypokinesis.  His degree of mitral regurgitation  had also improved, now mild, and he had moderate left atrial dilatation.  I reviewed these results with the patient today.  The electrocardiogram  shows sinus rhythm with evidence of previous inferior wall infarction  and decreased anterolateral R wave progression as noted previously.  He  states that his breathing in general has been somewhat better, but he  still has NYHA Class II dyspnea on exertion.  He has occasional  palpitations but reports that these are tolerable.   ALLERGIES:  NO KNOWN DRUG ALLERGIES.   PRESENT MEDICATIONS:  1. Lipitor 20 mg p.o. daily.  2. Glipizide XL 10 mg p.o. b.i.d.  3. Quinapril 10 mg p.o. b.i.d.  4. Digitek 0.25 mg p.o. daily.  5. Lantus.  6. Humulin.  7. Aspirin 81 mg p.o. daily.  8. Multivitamin 1 p.o. daily.  9. Azmacort as directed.  10.Lasix 40 mg alternating with 80 mg p.o. every other day.  11.Coreg 12.5 mg p.o. b.i.d.  12.Imdur 30 mg p.o. daily.   REVIEW OF SYSTEMS:  As described in the history of present illness.   EXAMINATION:  Blood pressure today is 115/60, heart rate is 64, weight  is 219 pounds.  The patient is comfortable  and in no acute distress.  NECK:  Reveals no elevated jugular venous pressure or loud bruits, no  thyromegalies noted.  LUNGS:  Clear without labored breathing at rest.  CARDIAC:  Reveals a regular rate and rhythm without S3 gallop or loud  murmur.  ABDOMEN:  Soft, nontender.  EXTREMITIES:  Show trace edema.   IMPRESSION AND RECOMMENDATIONS:  1. Multivessel coronary disease, status post previous inferior wall      myocardial infarction and coronary artery bypass grafting in 1996.      He has had improvement in his baseline left ventricular systolic      function to the normal range based on recent echocardiography in      the setting of titration of medical therapy.  He also reports some      improvement in symptoms.  I have asked him to continue his present      regimen and will plan to see him back over the next 6 months.  In      the meanwhile he will continue to follow  up with Dr. Alphonsus Sias.  2. Mitral regurgitation, now noted to be mild by echocardiography.  We      will follow.     Jonelle Sidle, MD  Electronically Signed    SGM/MedQ  DD: 11/09/2006  DT: 11/09/2006  Job #: [id number]   cc:   Karie Schwalbe, MD

## 2010-12-25 ENCOUNTER — Other Ambulatory Visit: Payer: Self-pay | Admitting: *Deleted

## 2010-12-25 MED ORDER — "INSULIN SYRINGE-NEEDLE U-100 31G X 5/16"" 1 ML MISC": Status: DC

## 2011-01-11 ENCOUNTER — Encounter: Payer: Self-pay | Admitting: Internal Medicine

## 2011-01-13 ENCOUNTER — Encounter: Payer: Self-pay | Admitting: Internal Medicine

## 2011-01-13 ENCOUNTER — Ambulatory Visit (INDEPENDENT_AMBULATORY_CARE_PROVIDER_SITE_OTHER): Payer: MEDICARE | Admitting: Internal Medicine

## 2011-01-13 VITALS — BP 110/60 | HR 66 | Temp 98.6°F | Ht 69.0 in | Wt 172.0 lb

## 2011-01-13 DIAGNOSIS — N400 Enlarged prostate without lower urinary tract symptoms: Secondary | ICD-10-CM

## 2011-01-13 DIAGNOSIS — I5022 Chronic systolic (congestive) heart failure: Secondary | ICD-10-CM

## 2011-01-13 DIAGNOSIS — E119 Type 2 diabetes mellitus without complications: Secondary | ICD-10-CM

## 2011-01-13 DIAGNOSIS — E785 Hyperlipidemia, unspecified: Secondary | ICD-10-CM

## 2011-01-13 DIAGNOSIS — I2510 Atherosclerotic heart disease of native coronary artery without angina pectoris: Secondary | ICD-10-CM

## 2011-01-13 NOTE — Assessment & Plan Note (Signed)
Seems to be quiet No changes needed 

## 2011-01-13 NOTE — Assessment & Plan Note (Signed)
Has done better since diabetic counselling and lantus Hasn't needed regular coverage of late Has kept weight off Will recheck A1c

## 2011-01-13 NOTE — Assessment & Plan Note (Signed)
Compensated Weight is stable Lab Results  Component Value Date   CREATININE 0.99 07/26/2010   Will recheck renal function

## 2011-01-13 NOTE — Progress Notes (Signed)
Subjective:    Patient ID: [name], male    DOB: [birth year], 70 y.o.   MRN: [id number]  HPI Doing okay  Checks sugars tid with meals Hasn't needed regular insulin Fasting sugars are better--may be in 90's at times No hypoglycemic reactions  Still with exzema in face--eyebrows, etc Some red spots on scalp also Shaved head but this didn't help Discussed medicated shampoo (head and shoulders) and cortisone cream on face (triamcinolone)  No chest pain Chronic DOE and occ at rest No change in his stamina (some dyspnea if walks steadily for 20-30 yards) Occ edema in feet Ongoing neuropathy pain--occ stabbing pain (but brief)  Vods okay Has some frequency in day No nocturia  Current Outpatient Prescriptions on File Prior to Visit  Medication Sig Dispense Refill  . aspirin 81 MG tablet Take 81 mg by mouth daily.        Marland Kitchen atorvastatin (LIPITOR) 20 MG tablet Take 20 mg by mouth daily.        . B Complex-C (B-COMPLEX WITH VITAMIN C) tablet Take 1 tablet by mouth daily.        . carvedilol (COREG) 12.5 MG tablet Take 12.5 mg by mouth 2 (two) times daily with a meal.        . Cholecalciferol (VITAMIN D) 1000 UNITS capsule Take 1,000 Units by mouth daily.        . digoxin (LANOXIN) 0.25 MG tablet Take 250 mcg by mouth daily.        . furosemide (LASIX) 40 MG tablet Take 40 mg by mouth daily.        Marland Kitchen glipiZIDE (GLUCOTROL) 10 MG 24 hr tablet Take 10 mg by mouth daily.        Marland Kitchen glucose blood (TRUETRACK TEST) test strip Check blood sugar 3 to 5 times a day.  Dx 250.00  200 each  12  . insulin glargine (LANTUS) 100 UNIT/ML injection Inject 20 Units into the skin at bedtime.        . insulin regular (HUMULIN R) 100 UNIT/ML injection 5 units if over 200 and 10 units if over 300       . Insulin Syringe-Needle U-100 (BD INSULIN SYRINGE ULTRAFINE) 31G X 5/16" 1 ML MISC USE AS DIRECTED  100 each  11  . isosorbide mononitrate (IMDUR) 30 MG 24 hr tablet Take 30 mg by mouth daily.        Marland Kitchen  LORazepam (ATIVAN) 0.5 MG tablet Take one half to one tablet by mouth every night at bedtime as needed to help sleep.  30 tablet  1  . quinapril (ACCUPRIL) 10 MG tablet Take 10 mg by mouth 2 (two) times daily.        Marland Kitchen triamcinolone (KENALOG) 0.1 % cream Apply topically 2 (two) times daily.         Past Medical History  Diagnosis Date  . Atrial fibrillation      S/P ablation  . CAD (coronary artery disease)     S/P MI 1980's----------------------Dr McDowell  . Hyperlipidemia   . CHF (congestive heart failure)   . 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 BPH (benign prostatic hypertrophy)   . Stroke   . Sleep disturbance     Past Surgical History  Procedure Date  . Coronary artery bypass graft   . Appendectomy   . Tonsillectomy   . Upper gastrointestinal endoscopy     Gastritis , ? H. pylori 08/28/1999  .  Atrial ablation surgery     No family history on file.  History   Social History  . Marital Status: Single    Spouse Name: N/A    Number of Children: N/A  . Years of Education: N/A   Occupational History  . disabled due to heart disease    Social History Main Topics  . Smoking status: Former Games developer  . Smokeless tobacco: Not on file  . Alcohol Use: No  . Drug Use: No  . Sexually Active: Not on file   Other Topics Concern  . Not on file   Social History Narrative   Single--lives alone--11/2008 staying with sister who has Alzheimer's   Review of Systems Appetite is okay----has kept weight down  Sleeps poorly--very "hideous dreams'. This is long standing occ tired in day--occ naps    Objective:   Physical Exam  Constitutional: He appears well-developed and well-nourished. No distress.  Neck: Normal range of motion. Neck supple. No thyromegaly present.  Cardiovascular: Normal rate, regular rhythm, normal heart sounds and intact distal pulses.  Exam reveals no gallop.   No murmur heard. Pulmonary/Chest: Effort normal and breath  sounds normal. No respiratory distress. He has no wheezes. He has no rales.  Musculoskeletal: Normal range of motion. He exhibits no edema and no tenderness.  Lymphadenopathy:    He has no cervical adenopathy.  Neurological: He is alert.       Decreased sensation in feet  Skin: Skin is warm. No rash noted.       Mild callous on plantar feet  Psychiatric: He has a normal mood and affect. His behavior is normal. Judgment and thought content normal.          Assessment & Plan:

## 2011-01-13 NOTE — Assessment & Plan Note (Signed)
Lab Results  Component Value Date   LDLCALC 44 07/26/2010   Will recheck labs next time

## 2011-01-13 NOTE — Assessment & Plan Note (Signed)
Voids okay  No changes needed 

## 2011-01-14 LAB — BASIC METABOLIC PANEL
Calcium: 9.7 mg/dL (ref 8.4–10.5)
GFR: 71.28 mL/min (ref >60.00)
Glucose, Bld: 270 mg/dL — ABNORMAL HIGH (ref 70–99)
Sodium: 136 mEq/L (ref 135–145)

## 2011-01-14 LAB — HEMOGLOBIN A1C: Hgb A1c MFr Bld: 9.4 % — ABNORMAL HIGH (ref 4.6–6.5)

## 2011-01-15 ENCOUNTER — Other Ambulatory Visit: Payer: Self-pay | Admitting: *Deleted

## 2011-01-15 MED ORDER — GLIPIZIDE ER 10 MG PO TB24: 10.0000 mg | ORAL_TABLET | Freq: Every day | ORAL | Status: DC

## 2011-01-17 ENCOUNTER — Encounter: Payer: Self-pay | Admitting: *Deleted

## 2011-01-20 ENCOUNTER — Encounter: Payer: Self-pay | Admitting: Internal Medicine

## 2011-02-17 ENCOUNTER — Other Ambulatory Visit: Payer: Self-pay | Admitting: *Deleted

## 2011-02-17 MED ORDER — LORAZEPAM 0.5 MG PO TABS: ORAL_TABLET | ORAL | Status: DC

## 2011-02-17 NOTE — Telephone Encounter (Signed)
Fax is on your desk . 

## 2011-02-17 NOTE — Telephone Encounter (Signed)
rx faxed to pharmacy manually  

## 2011-02-17 NOTE — Telephone Encounter (Signed)
Okay #30 x 1 

## 2011-02-18 ENCOUNTER — Other Ambulatory Visit: Payer: Self-pay | Admitting: *Deleted

## 2011-02-18 MED ORDER — INSULIN GLARGINE 100 UNIT/ML ~~LOC~~ SOLN: 25.0000 [IU] | Freq: Every day | SUBCUTANEOUS | Status: DC

## 2011-02-18 MED ORDER — CARVEDILOL 12.5 MG PO TABS: 12.5000 mg | ORAL_TABLET | Freq: Two times a day (BID) | ORAL | Status: DC

## 2011-02-18 NOTE — Telephone Encounter (Signed)
rx sent to pharmacy by e-script  

## 2011-03-19 ENCOUNTER — Other Ambulatory Visit: Payer: Self-pay | Admitting: Internal Medicine

## 2011-03-19 MED ORDER — ATORVASTATIN CALCIUM 20 MG PO TABS: 20.0000 mg | ORAL_TABLET | Freq: Every day | ORAL | Status: DC

## 2011-03-19 NOTE — Telephone Encounter (Signed)
Sent electronically 

## 2011-05-20 ENCOUNTER — Other Ambulatory Visit: Payer: Self-pay | Admitting: *Deleted

## 2011-05-20 MED ORDER — LORAZEPAM 0.5 MG PO TABS: ORAL_TABLET | ORAL | Status: DC

## 2011-05-20 NOTE — Telephone Encounter (Signed)
rx faxed to pharmacy manually  

## 2011-05-20 NOTE — Telephone Encounter (Signed)
Last refill 04/18/2011, Rx in your IN box.

## 2011-05-20 NOTE — Telephone Encounter (Signed)
Okay #30 x 1 

## 2011-05-30 ENCOUNTER — Other Ambulatory Visit: Payer: Self-pay | Admitting: *Deleted

## 2011-05-30 MED ORDER — FUROSEMIDE 40 MG PO TABS: 40.0000 mg | ORAL_TABLET | Freq: Every day | ORAL | Status: DC

## 2011-06-19 ENCOUNTER — Telehealth: Payer: Self-pay

## 2011-06-19 MED ORDER — ISOSORBIDE MONONITRATE ER 30 MG PO TB24: 30.0000 mg | ORAL_TABLET | Freq: Every day | ORAL | Status: DC

## 2011-06-19 NOTE — Telephone Encounter (Signed)
Refill sent for isosorbide mono 30 mg take one tablet daily.  

## 2011-07-16 ENCOUNTER — Ambulatory Visit (INDEPENDENT_AMBULATORY_CARE_PROVIDER_SITE_OTHER): Payer: Medicare Other

## 2011-07-16 DIAGNOSIS — Z23 Encounter for immunization: Secondary | ICD-10-CM

## 2011-07-18 ENCOUNTER — Other Ambulatory Visit: Payer: Self-pay | Admitting: Internal Medicine

## 2011-07-18 NOTE — Telephone Encounter (Signed)
Patient states that his insulin dosage needs to be increase because he runs out before it's time to refill.

## 2011-07-19 NOTE — Telephone Encounter (Signed)
Please review his current doses and redo his Rx to be sure he has enough for a month each time

## 2011-07-21 ENCOUNTER — Encounter: Payer: Self-pay | Admitting: Internal Medicine

## 2011-07-21 ENCOUNTER — Ambulatory Visit (INDEPENDENT_AMBULATORY_CARE_PROVIDER_SITE_OTHER): Payer: Medicare Other | Admitting: Internal Medicine

## 2011-07-21 VITALS — BP 118/78 | HR 72 | Temp 97.5°F | Wt 175.0 lb

## 2011-07-21 DIAGNOSIS — E785 Hyperlipidemia, unspecified: Secondary | ICD-10-CM

## 2011-07-21 DIAGNOSIS — I5022 Chronic systolic (congestive) heart failure: Secondary | ICD-10-CM

## 2011-07-21 DIAGNOSIS — I4891 Unspecified atrial fibrillation: Secondary | ICD-10-CM

## 2011-07-21 DIAGNOSIS — I2510 Atherosclerotic heart disease of native coronary artery without angina pectoris: Secondary | ICD-10-CM

## 2011-07-21 DIAGNOSIS — Z0000 Encounter for general adult medical examination without abnormal findings: Secondary | ICD-10-CM

## 2011-07-21 DIAGNOSIS — E119 Type 2 diabetes mellitus without complications: Secondary | ICD-10-CM

## 2011-07-21 DIAGNOSIS — N400 Enlarged prostate without lower urinary tract symptoms: Secondary | ICD-10-CM

## 2011-07-21 NOTE — Assessment & Plan Note (Signed)
No problems on the meds

## 2011-07-21 NOTE — Assessment & Plan Note (Signed)
Seems to be compensated No changes 

## 2011-07-21 NOTE — Assessment & Plan Note (Signed)
Voids okay 

## 2011-07-21 NOTE — Assessment & Plan Note (Signed)
Exam slightly irregular but EKG shows sinus with PACs On aspirin

## 2011-07-21 NOTE — Progress Notes (Signed)
Subjective:    Patient ID: [name], male    DOB: Aug 08, 1940, 70 y.o.   MRN: [id number]  HPI Here for Medicare wellness visit Reviewed Advanced directives No falls in the past year  No depressed mood Not anhedonia  Does have hearing aide on the right Left is a problem but no aide yet due to insurance issues  Checks sugars four times a day Hasn't been using the regular insulin Didn't increase lantus from the 25 (I had asked him to take 30)  Had cough with phlegm for about 3 weeks Chest was rattling  Breathing was off Feels better now  Has had chest pain---"small" and causes bad dreams and makes him wake up Not heartburn No palpitations Notices some wetness in right hand Goes away when he changes position No exertional chest pain but does have stable DOE with extended walking  Has had stress--tried to sell property and town of [place] wouldn't allow him to Not sleeping well Uses the lorazepam at times  Does have urinary urgency Rare incontinence  Current Outpatient Prescriptions on File Prior to Visit  Medication Sig Dispense Refill  . aspirin 81 MG tablet Take 81 mg by mouth daily.        Marland Kitchen atorvastatin (LIPITOR) 20 MG tablet Take 1 tablet (20 mg total) by mouth daily.  30 tablet  11  . B Complex-C (B-COMPLEX WITH VITAMIN C) tablet Take 1 tablet by mouth daily.        . carvedilol (COREG) 12.5 MG tablet Take 1 tablet (12.5 mg total) by mouth 2 (two) times daily with a meal.  60 tablet  11  . Cholecalciferol (VITAMIN D) 1000 UNITS capsule Take 1,000 Units by mouth daily.        . digoxin (LANOXIN) 0.25 MG tablet Take 250 mcg by mouth daily.        . furosemide (LASIX) 40 MG tablet Take 1 tablet (40 mg total) by mouth daily.  30 tablet  11  . glipiZIDE (GLUCOTROL) 10 MG 24 hr tablet Take 1 tablet (10 mg total) by mouth daily.  30 tablet  11  . glucose blood (TRUETRACK TEST) test strip Check blood sugar 3 to 5 times a day.  Dx 250.00  200 each  12  . insulin  glargine (LANTUS) 100 UNIT/ML injection Inject 25 Units into the skin at bedtime.  10 mL  11  . insulin regular (HUMULIN R) 100 UNIT/ML injection Inject 2-6 Units into the skin 3 (three) times daily before meals. Before meals---2 units for 140-180, 4 units for 181-220, 6 units if over 220      . Insulin Syringe-Needle U-100 (BD INSULIN SYRINGE ULTRAFINE) 31G X 5/16" 1 ML MISC USE AS DIRECTED  100 each  11  . isosorbide mononitrate (IMDUR) 30 MG 24 hr tablet Take 1 tablet (30 mg total) by mouth daily.  30 tablet  6  . LORazepam (ATIVAN) 0.5 MG tablet Take one half to one tablet by mouth every night at bedtime as needed to help sleep.  30 tablet  1  . quinapril (ACCUPRIL) 10 MG tablet Take 10 mg by mouth 2 (two) times daily.        Marland Kitchen triamcinolone (KENALOG) 0.1 % cream Apply topically 2 (two) times daily.          No Known Allergies  Past Medical History  Diagnosis Date  . Atrial fibrillation      S/P ablation  . CAD (coronary artery disease)  S/P MI 1980's----------------------Dr McDowell  . Hyperlipidemia   . CHF (congestive heart failure)   . 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 BPH (benign prostatic hypertrophy)   . Stroke   . Sleep disturbance     Past Surgical History  Procedure Date  . Coronary artery bypass graft   . Appendectomy   . Tonsillectomy   . Upper gastrointestinal endoscopy     Gastritis , ? H. pylori 08/28/1999  . Atrial ablation surgery     No family history on file.  History   Social History  . Marital Status: Single    Spouse Name: N/A    Number of Children: N/A  . Years of Education: N/A   Occupational History  . disabled due to heart disease    Social History Main Topics  . Smoking status: Former Games developer  . Smokeless tobacco: Not on file  . Alcohol Use: No  . Drug Use: No  . Sexually Active: Not on file   Other Topics Concern  . Not on file   Social History Narrative   Single--lives aloneStays with  sister with Altzheimer's frequentlyHas living willBrother, Gerlene Burdock has health care POAWould accept resuscitation but no prolonged artificial ventilationNo feeding tube if cognitively unaware   Review of Systems Appetite is good Weight is down somewhat Bowels are fine     Objective:   Physical Exam  Constitutional: He is oriented to person, place, and time. He appears well-developed and well-nourished. No distress.  HENT:  Mouth/Throat: Oropharynx is clear and moist. No oropharyngeal exudate.  Neck: Normal range of motion. Neck supple. No thyromegaly present.  Cardiovascular: Normal rate, normal heart sounds and intact distal pulses.  Exam reveals no gallop.   No murmur heard.      irregular  Pulmonary/Chest: Effort normal and breath sounds normal. No respiratory distress. He has no wheezes. He has no rales.  Abdominal: Soft. There is no tenderness.  Musculoskeletal: He exhibits no edema and no tenderness.       Plantar callous  Lymphadenopathy:    He has no cervical adenopathy.  Neurological: He is alert and oriented to person, place, and time.       President-- "Obama, Milas Kocher, Bush, Reagan" [phone] [id number] Recall 1/3  Skin:       Mycotic toenails but no foot lesions  Psychiatric: He has a normal mood and affect. His behavior is normal.          Assessment & Plan:

## 2011-07-21 NOTE — Telephone Encounter (Signed)
Patient coming in today at 3pm for CPX, will review then.

## 2011-07-21 NOTE — Patient Instructions (Signed)
Please do the stool test and send it back in Get the shingles vaccine (zostavax) when you can

## 2011-07-21 NOTE — Assessment & Plan Note (Signed)
Poor control Will revise his schedule for regular insulin before meals

## 2011-07-21 NOTE — Assessment & Plan Note (Signed)
Has vague non ischemic sounding chest pain IVCD on EKG makes it not useful for ischemia eval Will have Dr Mariah Milling recheck him in the next few months

## 2011-07-21 NOTE — Assessment & Plan Note (Signed)
I have personally reviewed the Medicare Annual Wellness questionnaire and have noted 1. The patient's medical and social history 2. Their use of alcohol, tobacco or illicit drugs 3. Their current medications and supplements 4. The patient's functional ability including ADL's, fall risks, home safety risks and hearing or visual             impairment. 5. Diet and physical activities 6. Evidence for depression or mood disorders  The patients weight, height, BMI and visual acuity have been recorded in the chart I have made referrals, counseling and provided education to the patient based review of the above and I have provided the pt with a written personalized care plan for preventive services.  I have provided you with a copy of your personalized plan for preventive services. Please take the time to review along with your updated medication list.  Discussed PSA testing---will not do this Due for colon cancer screening--he prefers stool immunoassay rx given for zostavax--may have to wait a while due to finances Mild memory issues--not clearly enough for [facility] but will monitor

## 2011-07-22 LAB — BASIC METABOLIC PANEL
CO2: 31 mEq/L (ref 19–32)
Chloride: 101 mEq/L (ref 96–112)
Glucose, Bld: 136 mg/dL — ABNORMAL HIGH (ref 70–99)
Sodium: 139 mEq/L (ref 135–145)

## 2011-07-22 LAB — MICROALBUMIN / CREATININE URINE RATIO
Creatinine,U: 66.7 mg/dL
Microalb Creat Ratio: 6.4 mg/g (ref 0.0–30.0)
Microalb, Ur: 4.3 mg/dL — ABNORMAL HIGH (ref 0.0–1.9)

## 2011-07-22 LAB — HEPATIC FUNCTION PANEL
ALT: 63 U/L — ABNORMAL HIGH (ref 0–53)
Albumin: 4.3 g/dL (ref 3.5–5.2)
Bilirubin, Direct: 0.3 mg/dL (ref 0.0–0.3)
Total Protein: 7.7 g/dL (ref 6.0–8.3)

## 2011-07-22 LAB — HEMOGLOBIN A1C: Hgb A1c MFr Bld: 9.7 % — ABNORMAL HIGH (ref 4.6–6.5)

## 2011-07-22 LAB — CBC WITH DIFFERENTIAL/PLATELET
Basophils Relative: 0.3 % (ref 0.0–3.0)
Eosinophils Absolute: 0.2 10*3/uL (ref 0.0–0.7)
HCT: 44.7 % (ref 39.0–52.0)
Hemoglobin: 15.2 g/dL (ref 13.0–17.0)
Lymphs Abs: 1.2 10*3/uL (ref 0.7–4.0)
MCHC: 34.1 g/dL (ref 30.0–36.0)
MCV: 93.1 fl (ref 78.0–100.0)
Monocytes Absolute: 0.4 10*3/uL (ref 0.1–1.0)
Neutro Abs: 3.8 10*3/uL (ref 1.4–7.7)
RBC: 4.8 Mil/uL (ref 4.22–5.81)

## 2011-07-22 LAB — LIPID PANEL: Total CHOL/HDL Ratio: 3

## 2011-07-23 LAB — DIGOXIN LEVEL: Digoxin Level: 1.1 ng/mL (ref 0.8–2.0)

## 2011-07-28 ENCOUNTER — Encounter: Payer: Self-pay | Admitting: *Deleted

## 2011-07-30 ENCOUNTER — Other Ambulatory Visit: Payer: Self-pay | Admitting: Internal Medicine

## 2011-07-30 ENCOUNTER — Other Ambulatory Visit: Payer: Medicare Other

## 2011-07-30 DIAGNOSIS — Z1211 Encounter for screening for malignant neoplasm of colon: Secondary | ICD-10-CM

## 2011-07-30 LAB — FECAL OCCULT BLOOD, IMMUNOCHEMICAL: Fecal Occult Bld: NEGATIVE

## 2011-08-04 ENCOUNTER — Encounter: Payer: Self-pay | Admitting: *Deleted

## 2011-08-04 ENCOUNTER — Other Ambulatory Visit: Payer: Self-pay | Admitting: *Deleted

## 2011-08-04 MED ORDER — GLUCOSE BLOOD VI STRP: ORAL_STRIP | Status: DC

## 2011-08-13 ENCOUNTER — Ambulatory Visit (INDEPENDENT_AMBULATORY_CARE_PROVIDER_SITE_OTHER): Payer: Medicare Other | Admitting: Cardiovascular Disease

## 2011-08-13 ENCOUNTER — Encounter: Payer: Self-pay | Admitting: Cardiovascular Disease

## 2011-08-13 DIAGNOSIS — I2510 Atherosclerotic heart disease of native coronary artery without angina pectoris: Secondary | ICD-10-CM

## 2011-08-13 DIAGNOSIS — R079 Chest pain, unspecified: Secondary | ICD-10-CM

## 2011-08-13 DIAGNOSIS — E119 Type 2 diabetes mellitus without complications: Secondary | ICD-10-CM

## 2011-08-13 DIAGNOSIS — I4891 Unspecified atrial fibrillation: Secondary | ICD-10-CM

## 2011-08-13 DIAGNOSIS — I5022 Chronic systolic (congestive) heart failure: Secondary | ICD-10-CM

## 2011-08-13 DIAGNOSIS — Z7901 Long term (current) use of anticoagulants: Secondary | ICD-10-CM

## 2011-08-13 DIAGNOSIS — E785 Hyperlipidemia, unspecified: Secondary | ICD-10-CM

## 2011-08-13 MED ORDER — WARFARIN SODIUM 5 MG PO TABS: 5.0000 mg | ORAL_TABLET | Freq: Every day | ORAL | Status: DC

## 2011-08-13 NOTE — Assessment & Plan Note (Signed)
Need atrial fibrillation since his last clinic visit in early 2011, first noted in December 2012.  We will start him on warfarinAnd consider either pharmacological or DC cardioversion several weeks from now. We have suggested he followup in Coumadin clinic next week. We have given him the option of seeing Dr. Alphonsus Sias for his INR or if he prefers, he could have this done here.

## 2011-08-13 NOTE — Patient Instructions (Addendum)
You are doing well. Please start warfarin one every evening  We will check your warfarin next week on Wednesday at 10:15 Am  Please call us if you have new issues that need to be addressed before your next appt.  Your physician wants you to follow-up in: 5  To 6 weeks You will receive a reminder letter in the mail two months in advance. If you don't receive a letter, please call our office to schedule the follow-up appointment.

## 2011-08-13 NOTE — Assessment & Plan Note (Signed)
We have encouraged him to closely watch his diet in an effort to improve his diabetes.

## 2011-08-13 NOTE — Progress Notes (Signed)
Patient ID: [name], male    DOB: Oct 28, 1940, 71 y.o.   MRN: [id number]  HPI Comments: Mr. Haston Mcclaskey is a pleasant 71 year old gentleman with past medical history of diabetes, coronary artery disease, bypass 1996, Atrial fibrillation with history of ablation, hyperlipidemia, obstructive sleep apnea, hypertension and COPD who presents for routine followup.   He was last seen in early 2011 and at that time was in normal sinus rhythm. On his recent visit to Dr. Alphonsus Sias he was noted to be in atrial fibrillation. He has noticed increased shortness of breath though he is uncertain when this started. It has been a chronic thing and this was the reason for his echocardiogram and stress test in 2011. 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 Inferior wall defect was seen on stress test in 2001.  He has had rare episodes of chest pain. He reports having 2 episodes over the past month. Details are not very specific though seemed to have happened on one occasion while he was walking.  His other main complaint is nightmares which wake him up. He is also quite concerned about a feeling of being cold on half of his body depending on which way he turns in bed.  EKG shows atrial fibrillation/flutter with ventricular rate 65 beats per minute, left anterior fascicular block, poor R-wave progression through the precordial leads   Outpatient Encounter Prescriptions as of 08/13/2011  Medication Sig Dispense Refill  . aspirin 81 MG tablet Take 81 mg by mouth daily.        Marland Kitchen atorvastatin (LIPITOR) 20 MG tablet Take 1 tablet (20 mg total) by mouth daily.  30 tablet  11  . B Complex-C (B-COMPLEX WITH VITAMIN C) tablet Take 1 tablet by mouth daily.        . carvedilol (COREG) 12.5 MG tablet Take 1 tablet (12.5 mg total) by mouth 2 (two) times daily with a meal.  60 tablet  11  . digoxin (LANOXIN) 0.25 MG tablet Take 250 mcg by mouth daily.        . furosemide (LASIX) 40 MG  tablet Take 1 tablet (40 mg total) by mouth daily.  30 tablet  11  . glipiZIDE (GLUCOTROL) 10 MG 24 hr tablet Take 1 tablet (10 mg total) by mouth daily.  30 tablet  11  . glucose blood (RELION PRIME TEST) test strip Test blood sugar 3-5 times daily, dx: 250.00  200 each  11  . insulin glargine (LANTUS) 100 UNIT/ML injection Inject 25 Units into the skin at bedtime.  10 mL  11  . insulin regular (HUMULIN R) 100 UNIT/ML injection Inject 2-6 Units into the skin 3 (three) times daily before meals. Before meals---2 units for 140-180, 4 units for 181-220, 6 units if over 220      . Insulin Syringe-Needle U-100 (BD INSULIN SYRINGE ULTRAFINE) 31G X 5/16" 1 ML MISC USE AS DIRECTED  100 each  11  . isosorbide mononitrate (IMDUR) 30 MG 24 hr tablet Take 1 tablet (30 mg total) by mouth daily.  30 tablet  6  . LORazepam (ATIVAN) 0.5 MG tablet Take one half to one tablet by mouth every night at bedtime as needed to help sleep.  30 tablet  1  . Multiple Vitamin (MULTIVITAMIN) tablet Take 1 tablet by mouth daily.      . Omega-3 Fatty Acids (FISH OIL) 1000 MG CAPS Take 1,000 mg by mouth daily.      Marland Kitchen  quinapril (ACCUPRIL) 10 MG tablet Take 10 mg by mouth 2 (two) times daily.        Marland Kitchen triamcinolone (KENALOG) 0.1 % cream Apply topically 2 (two) times daily.        Marland Kitchen warfarin (COUMADIN) 5 MG tablet Take 1 tablet (5 mg total) by mouth daily.  30 tablet  0  . DISCONTD: Cholecalciferol (VITAMIN D) 1000 UNITS capsule Take 1,000 Units by mouth daily.           Review of Systems  Constitutional: Positive for fatigue.  HENT: Negative.   Eyes: Negative.   Respiratory: Positive for shortness of breath.   Cardiovascular: Positive for chest pain.  Gastrointestinal: Negative.   Musculoskeletal: Negative.   Skin: Negative.   Neurological: Negative.   Hematological: Negative.   Psychiatric/Behavioral: Negative.   All other systems reviewed and are negative.    BP 113/68  Pulse 65  Ht 5\' 9"  (1.753 m)  Wt 173 lb  (78.472 kg)  BMI 25.55 kg/m2  Physical Exam  Nursing note and vitals reviewed. Constitutional: He is oriented to person, place, and time. He appears well-developed and well-nourished.  HENT:  Head: Normocephalic.  Nose: Nose normal.  Mouth/Throat: Oropharynx is clear and moist.  Eyes: Conjunctivae are normal. Pupils are equal, round, and reactive to light.  Neck: Normal range of motion. Neck supple. No JVD present.  Cardiovascular: Normal rate, S1 normal, S2 normal and intact distal pulses.  An irregularly irregular rhythm present. Exam reveals no gallop and no friction rub.   Murmur heard.  Crescendo systolic murmur is present with a grade of 2/6  Pulmonary/Chest: Effort normal and breath sounds normal. No respiratory distress. He has no wheezes. He has no rales. He exhibits no tenderness.  Abdominal: Soft. Bowel sounds are normal. He exhibits no distension. There is no tenderness.  Musculoskeletal: Normal range of motion. He exhibits no edema and no tenderness.  Lymphadenopathy:    He has no cervical adenopathy.  Neurological: He is alert and oriented to person, place, and time. Coordination normal.  Skin: Skin is warm and dry. No rash noted. No erythema.  Psychiatric: He has a normal mood and affect. His behavior is normal. Judgment and thought content normal.           Assessment and Plan

## 2011-08-13 NOTE — Assessment & Plan Note (Signed)
Rare episodes of chest pain. We have suggested if he has more of these episodes, that he contact our office. On today's visit, we did talk about repeat stress testing or cardiac catheterization. He did not seem particularly interested in either at this time. No significant change in EKG apart from the rhythm.

## 2011-08-13 NOTE — Assessment & Plan Note (Signed)
Cholesterol is at goal on the current lipid regimen. No changes to the medications were made.  

## 2011-08-14 ENCOUNTER — Other Ambulatory Visit: Payer: Self-pay | Admitting: *Deleted

## 2011-08-14 MED ORDER — DIGOXIN 250 MCG PO TABS: 250.0000 ug | ORAL_TABLET | Freq: Every day | ORAL | Status: DC

## 2011-08-14 NOTE — Telephone Encounter (Signed)
Rx sent electronically  Digoxin level 1.1 last month

## 2011-08-14 NOTE — Telephone Encounter (Signed)
Ok to refill 

## 2011-08-20 ENCOUNTER — Encounter: Payer: Medicare Other | Admitting: Emergency Medicine

## 2011-08-20 ENCOUNTER — Ambulatory Visit (INDEPENDENT_AMBULATORY_CARE_PROVIDER_SITE_OTHER): Payer: Medicare Other | Admitting: Emergency Medicine

## 2011-08-20 DIAGNOSIS — G459 Transient cerebral ischemic attack, unspecified: Secondary | ICD-10-CM

## 2011-08-20 DIAGNOSIS — Z7901 Long term (current) use of anticoagulants: Secondary | ICD-10-CM | POA: Insufficient documentation

## 2011-08-20 DIAGNOSIS — I4891 Unspecified atrial fibrillation: Secondary | ICD-10-CM

## 2011-08-27 ENCOUNTER — Ambulatory Visit (INDEPENDENT_AMBULATORY_CARE_PROVIDER_SITE_OTHER): Payer: Medicare Other | Admitting: Emergency Medicine

## 2011-08-27 DIAGNOSIS — Z7901 Long term (current) use of anticoagulants: Secondary | ICD-10-CM

## 2011-08-27 DIAGNOSIS — I4891 Unspecified atrial fibrillation: Secondary | ICD-10-CM

## 2011-08-27 DIAGNOSIS — G459 Transient cerebral ischemic attack, unspecified: Secondary | ICD-10-CM

## 2011-09-03 ENCOUNTER — Ambulatory Visit (INDEPENDENT_AMBULATORY_CARE_PROVIDER_SITE_OTHER): Payer: Medicare Other | Admitting: Emergency Medicine

## 2011-09-03 DIAGNOSIS — Z7901 Long term (current) use of anticoagulants: Secondary | ICD-10-CM

## 2011-09-03 DIAGNOSIS — G459 Transient cerebral ischemic attack, unspecified: Secondary | ICD-10-CM

## 2011-09-03 DIAGNOSIS — I4891 Unspecified atrial fibrillation: Secondary | ICD-10-CM

## 2011-09-03 LAB — POCT INR: INR: 2.9

## 2011-09-05 LAB — HM DIABETES EYE EXAM

## 2011-09-10 ENCOUNTER — Encounter: Payer: Self-pay | Admitting: Cardiovascular Disease

## 2011-09-10 ENCOUNTER — Ambulatory Visit (INDEPENDENT_AMBULATORY_CARE_PROVIDER_SITE_OTHER): Payer: Medicare Other | Admitting: Cardiovascular Disease

## 2011-09-10 ENCOUNTER — Ambulatory Visit (INDEPENDENT_AMBULATORY_CARE_PROVIDER_SITE_OTHER): Payer: Medicare Other | Admitting: Emergency Medicine

## 2011-09-10 VITALS — BP 101/61 | HR 72 | Ht 69.0 in | Wt 175.0 lb

## 2011-09-10 DIAGNOSIS — I4891 Unspecified atrial fibrillation: Secondary | ICD-10-CM

## 2011-09-10 DIAGNOSIS — I5022 Chronic systolic (congestive) heart failure: Secondary | ICD-10-CM

## 2011-09-10 DIAGNOSIS — G459 Transient cerebral ischemic attack, unspecified: Secondary | ICD-10-CM

## 2011-09-10 DIAGNOSIS — I2510 Atherosclerotic heart disease of native coronary artery without angina pectoris: Secondary | ICD-10-CM

## 2011-09-10 DIAGNOSIS — Z7901 Long term (current) use of anticoagulants: Secondary | ICD-10-CM

## 2011-09-10 DIAGNOSIS — E785 Hyperlipidemia, unspecified: Secondary | ICD-10-CM

## 2011-09-10 LAB — POCT INR: INR: 3

## 2011-09-10 MED ORDER — QUINAPRIL HCL 10 MG PO TABS: 10.0000 mg | ORAL_TABLET | Freq: Every day | ORAL | Status: DC

## 2011-09-10 MED ORDER — WARFARIN SODIUM 5 MG PO TABS: ORAL_TABLET | ORAL | Status: DC

## 2011-09-10 NOTE — Progress Notes (Signed)
Patient ID: [name], male    DOB: [birth year], 71 y.o.   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 Inferior wall defect was seen on stress test in 2011.  Hx of nightmares which wake him up. He is also quite concerned about a feeling of being cold on half of his body depending on which way he turns in bed.   He was last seen in early 2011 and at that time was in normal sinus rhythm. On his recent visit to Dr. Alphonsus Sias in 2012 he was noted to be in atrial fibrillation. He has noticed increased shortness of breath though he is uncertain when this started (about >6 months ago). He has had some chronic SOB with previous workup in 2011 echocardiogram and stress test .  Since his last clinic visit, he has been doing well though continues to have shortness of breath. He is interested in fixing his arrhythmia to see if this improves his  EKG shows atrial flutter with ventricular rate 72 beats per minute, Intraventricular conduction delay, possible old anterior and inferior infarct   Outpatient Encounter Prescriptions as of 09/10/2011  Medication Sig Dispense Refill  . aspirin 81 MG tablet Take 81 mg by mouth daily.        Marland Kitchen atorvastatin (LIPITOR) 20 MG tablet Take 1 tablet (20 mg total) by mouth daily.  30 tablet  11  . B Complex-C (B-COMPLEX WITH VITAMIN C) tablet Take 1 tablet by mouth daily.        . carvedilol (COREG) 12.5 MG tablet Take 1 tablet (12.5 mg total) by mouth 2 (two) times daily with a meal.  60 tablet  11  . digoxin (LANOXIN) 0.25 MG tablet Take 1 tablet (250 mcg total) by mouth daily.  30 tablet  11  . furosemide (LASIX) 40 MG tablet Take 1 tablet (40  mg total) by mouth daily.  30 tablet  11  . glipiZIDE (GLUCOTROL) 10 MG 24 hr tablet Take 1 tablet (10 mg total) by mouth daily.  30 tablet  11  . glucose blood (RELION PRIME TEST) test strip Test blood sugar 3-5 times daily, dx: 250.00  200 each  11  . insulin glargine (LANTUS) 100 UNIT/ML injection Inject 25 Units into the skin at bedtime.  10 mL  11  . insulin regular (HUMULIN R) 100 UNIT/ML injection Inject 2-6 Units into the skin 3 (three) times daily before meals. Before meals---2 units for 140-180, 4 units for 181-220, 6 units if over 220      . Insulin Syringe-Needle U-100 (BD INSULIN SYRINGE ULTRAFINE) 31G X 5/16" 1 ML MISC USE AS DIRECTED  100 each  11  . isosorbide mononitrate (IMDUR) 30 MG 24 hr tablet Take 1 tablet (30 mg total) by mouth daily.  30 tablet  6  . LORazepam (ATIVAN) 0.5 MG tablet Take one half to one tablet by mouth every night at bedtime as needed to help sleep.  30 tablet  1  . Multiple Vitamin (MULTIVITAMIN) tablet Take 1 tablet by mouth daily.      . Omega-3 Fatty Acids (FISH OIL) 1000 MG CAPS Take 1,000 mg by mouth daily.      Marland Kitchen triamcinolone (KENALOG) 0.1 % cream  Apply topically 2 (two) times daily.        Marland Kitchen warfarin (COUMADIN) 5 MG tablet Take as directed by anticoagulation clinic  50 tablet  3  .  quinapril (ACCUPRIL) 10 MG tablet Take 10 mg by mouth 2 (two) times daily.           Review of Systems  Constitutional: Positive for fatigue.  HENT: Negative.   Eyes: Negative.   Respiratory: Positive for shortness of breath.   Gastrointestinal: Negative.   Musculoskeletal: Negative.   Skin: Negative.   Neurological: Negative.   Hematological: Negative.   Psychiatric/Behavioral: Negative.   All other systems reviewed and are negative.    BP 101/61  Pulse 72  Ht 5\' 9"  (1.753 m)  Wt 175 lb (79.379 kg)  BMI 25.84 kg/m2  Physical Exam  Nursing note and vitals reviewed. Constitutional: He is oriented to person, place, and time. He appears well-developed  and well-nourished.  HENT:  Head: Normocephalic.  Nose: Nose normal.  Mouth/Throat: Oropharynx is clear and moist.  Eyes: Conjunctivae are normal. Pupils are equal, round, and reactive to light.  Neck: Normal range of motion. Neck supple. No JVD present.  Cardiovascular: Normal rate, S1 normal, S2 normal and intact distal pulses.  An irregularly irregular rhythm present. Exam reveals no gallop and no friction rub.   Murmur heard.  Crescendo systolic murmur is present with a grade of 2/6  Pulmonary/Chest: Effort normal and breath sounds normal. No respiratory distress. He has no wheezes. He has no rales. He exhibits no tenderness.  Abdominal: Soft. Bowel sounds are normal. He exhibits no distension. There is no tenderness.  Musculoskeletal: Normal range of motion. He exhibits no edema and no tenderness.  Lymphadenopathy:    He has no cervical adenopathy.  Neurological: He is alert and oriented to person, place, and time. Coordination normal.  Skin: Skin is warm and dry. No rash noted. No erythema.  Psychiatric: He has a normal mood and affect. His behavior is normal. Judgment and thought content normal.           Assessment and Plan

## 2011-09-10 NOTE — Assessment & Plan Note (Signed)
Currently with no symptoms of angina. No further workup at this time. Continue current medication regimen. 

## 2011-09-10 NOTE — Assessment & Plan Note (Signed)
We have suggested he continue on his Lipitor. Cholesterol is at goal on the current lipid regimen. No changes to the medications were made.

## 2011-09-10 NOTE — Assessment & Plan Note (Addendum)
He appears to be relatively euvolemic on today's visit. He does report occasional dizziness and fatigue. Blood pressure is low today. We'll decrease the quinapril to 10 mg daily, down from b.i.d..

## 2011-09-10 NOTE — Assessment & Plan Note (Addendum)
We will meet with him again in 3 weeks' time to discuss starting antiarrhythmic medications such as amiodarone and possible cardioversion at that time.

## 2011-09-10 NOTE — Patient Instructions (Addendum)
You are doing well. Please take only one quinapril a day (not two) for low blood pressure  Please call us if you have new issues that need to be addressed before your next appt.  Your physician wants you to follow-up in: 3 weeks You will receive a reminder letter in the mail two months in advance. If you don't receive a letter, please call our office to schedule the follow-up appointment.

## 2011-09-24 ENCOUNTER — Ambulatory Visit (INDEPENDENT_AMBULATORY_CARE_PROVIDER_SITE_OTHER): Payer: Medicare Other | Admitting: *Deleted

## 2011-09-24 DIAGNOSIS — G459 Transient cerebral ischemic attack, unspecified: Secondary | ICD-10-CM

## 2011-09-24 DIAGNOSIS — I4891 Unspecified atrial fibrillation: Secondary | ICD-10-CM

## 2011-09-24 DIAGNOSIS — Z7901 Long term (current) use of anticoagulants: Secondary | ICD-10-CM

## 2011-09-24 LAB — POCT INR: INR: 3

## 2011-10-09 ENCOUNTER — Ambulatory Visit (INDEPENDENT_AMBULATORY_CARE_PROVIDER_SITE_OTHER): Payer: Medicare Other | Admitting: Cardiovascular Disease

## 2011-10-09 ENCOUNTER — Encounter (INDEPENDENT_AMBULATORY_CARE_PROVIDER_SITE_OTHER): Payer: Medicare Other

## 2011-10-09 ENCOUNTER — Encounter: Payer: Self-pay | Admitting: Cardiovascular Disease

## 2011-10-09 DIAGNOSIS — E785 Hyperlipidemia, unspecified: Secondary | ICD-10-CM

## 2011-10-09 DIAGNOSIS — I4891 Unspecified atrial fibrillation: Secondary | ICD-10-CM

## 2011-10-09 DIAGNOSIS — G459 Transient cerebral ischemic attack, unspecified: Secondary | ICD-10-CM

## 2011-10-09 DIAGNOSIS — I2510 Atherosclerotic heart disease of native coronary artery without angina pectoris: Secondary | ICD-10-CM

## 2011-10-09 DIAGNOSIS — Z01818 Encounter for other preprocedural examination: Secondary | ICD-10-CM

## 2011-10-09 DIAGNOSIS — R079 Chest pain, unspecified: Secondary | ICD-10-CM

## 2011-10-09 DIAGNOSIS — Z01810 Encounter for preprocedural cardiovascular examination: Secondary | ICD-10-CM

## 2011-10-09 DIAGNOSIS — E119 Type 2 diabetes mellitus without complications: Secondary | ICD-10-CM

## 2011-10-09 DIAGNOSIS — Z7901 Long term (current) use of anticoagulants: Secondary | ICD-10-CM

## 2011-10-09 MED ORDER — AMIODARONE HCL 200 MG PO TABS: 200.0000 mg | ORAL_TABLET | Freq: Two times a day (BID) | ORAL | Status: DC

## 2011-10-09 NOTE — Progress Notes (Signed)
Patient ID: [name], male    DOB: 1941/08/02, 71 y.o.   MRN: [id number]  HPI Comments: 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 Inferior wall defect was seen on stress test in 2011.  Hx of nightmares which wake him up. He is also quite concerned about a feeling of being cold on half of his body depending on which way he turns in bed.   in early 2011 he was in normal sinus rhythm. On a visit to Dr. Alphonsus Sias in 2012 he was noted to be in atrial fibrillation. He has increased shortness of breath. He has had some chronic SOB with previous workup in 2011 echocardiogram and stress test . He has been on anticoagulation with INR around 3. He is interested in cardioversion.  EKG shows atrial flutter with ventricular rate 65 beats per minute, Intraventricular conduction delay, possible old anterior and inferior infarct   Outpatient Encounter Prescriptions as of 10/09/2011  Medication Sig Dispense Refill  . aspirin 81 MG tablet Take 81 mg by mouth daily.        Marland Kitchen atorvastatin (LIPITOR) 20 MG tablet Take 1 tablet (20 mg total) by mouth daily.  30 tablet  11  . B Complex-C (B-COMPLEX WITH VITAMIN C) tablet Take 1 tablet by mouth daily.        . carvedilol (COREG) 12.5 MG tablet Take 1 tablet (12.5 mg total) by mouth 2 (two) times daily with a meal.  60 tablet  11  . digoxin (LANOXIN) 0.25 MG tablet Take 1 tablet (250 mcg total) by mouth daily.  30 tablet  11  . furosemide (LASIX) 40 MG tablet Take 1 tablet (40 mg total) by mouth daily.  30 tablet  11  . glipiZIDE (GLUCOTROL) 10 MG 24 hr tablet Take 1 tablet (10 mg total) by mouth daily.  30 tablet  11  . glucose blood (RELION PRIME TEST) test strip  Test blood sugar 3-5 times daily, dx: 250.00  200 each  11  . insulin glargine (LANTUS) 100 UNIT/ML injection Inject 25 Units into the skin at bedtime.  10 mL  11  . insulin regular (HUMULIN R) 100 UNIT/ML injection Inject 2-6 Units into the skin 3 (three) times daily before meals. Before meals---2 units for 140-180, 4 units for 181-220, 6 units if over 220      . Insulin Syringe-Needle U-100 (BD INSULIN SYRINGE ULTRAFINE) 31G X 5/16" 1 ML MISC USE AS DIRECTED  100 each  11  . isosorbide mononitrate (IMDUR) 30 MG 24 hr tablet Take 1 tablet (30 mg total) by mouth daily.  30 tablet  6  . LORazepam (ATIVAN) 0.5 MG tablet Take one half to one tablet by mouth every night at bedtime as needed to help sleep.  30 tablet  1  . Multiple Vitamin (MULTIVITAMIN) tablet Take 1 tablet by mouth daily.      . Omega-3 Fatty Acids (FISH OIL) 1000 MG CAPS Take 1,000 mg by mouth daily.      . quinapril (ACCUPRIL) 10 MG tablet Take 1 tablet (10 mg total) by mouth daily.  90 tablet  3  . triamcinolone (KENALOG) 0.1 % cream Apply topically 2 (two) times daily.        Marland Kitchen warfarin (COUMADIN)  5 MG tablet Take as directed by anticoagulation clinic  50 tablet  3    Review of Systems  Constitutional: Positive for fatigue.  HENT: Negative.   Eyes: Negative.   Respiratory: Positive for shortness of breath.   Gastrointestinal: Negative.   Musculoskeletal: Negative.   Skin: Negative.   Neurological: Negative.   Hematological: Negative.   Psychiatric/Behavioral: Negative.   All other systems reviewed and are negative.    BP 94/57  Pulse 65  Ht 5\' 9"  (1.753 m)  Wt 178 lb (80.74 kg)  BMI 26.29 kg/m2  Physical Exam  Nursing note and vitals reviewed. Constitutional: He is oriented to person, place, and time. He appears well-developed and well-nourished.  HENT:  Head: Normocephalic.  Nose: Nose normal.  Mouth/Throat: Oropharynx is clear and moist.  Eyes: Conjunctivae are normal. Pupils are equal, round, and reactive  to light.  Neck: Normal range of motion. Neck supple. No JVD present.  Cardiovascular: Normal rate, S1 normal, S2 normal and intact distal pulses.  An irregularly irregular rhythm present. Exam reveals no gallop and no friction rub.   Murmur heard.  Crescendo systolic murmur is present with a grade of 2/6  Pulmonary/Chest: Effort normal and breath sounds normal. No respiratory distress. He has no wheezes. He has no rales. He exhibits no tenderness.  Abdominal: Soft. Bowel sounds are normal. He exhibits no distension. There is no tenderness.  Musculoskeletal: Normal range of motion. He exhibits no edema and no tenderness.  Lymphadenopathy:    He has no cervical adenopathy.  Neurological: He is alert and oriented to person, place, and time. Coordination normal.  Skin: Skin is warm and dry. No rash noted. No erythema.  Psychiatric: He has a normal mood and affect. His behavior is normal. Judgment and thought content normal.           Assessment and Plan

## 2011-10-09 NOTE — Assessment & Plan Note (Signed)
Cholesterol is at goal on the current lipid regimen. No changes to the medications were made.  

## 2011-10-09 NOTE — Assessment & Plan Note (Signed)
Currently with no symptoms of angina. No further workup at this time. Continue current medication regimen. 

## 2011-10-09 NOTE — Patient Instructions (Addendum)
Please start amiodarone 200 mg twice a day We will  schedule a cardioversion for 1 to 2 weeks  Please call us if you have new issues that need to be adressed before your next appt.  Your physician wants you to follow-up in: 1 months.  You will receive a reminder letter in the mail two months in advance. If you don't receive a letter, please call our office to schedule the follow-up appointment.  You are scheduled for a Cardioversion for October 20, 2011 arrive at 6:30 am at [facility].  You should take all your medications the morning of the procedure.  Please allow someone to come with you to the procedure and drive you home.

## 2011-10-09 NOTE — Assessment & Plan Note (Signed)
At his request, we have scheduled [name] for a cardioversion next week. We will start him on amiodarone 200 mg twice a day today and continue this through the procedure with a slow wean after the cardioversion.

## 2011-10-09 NOTE — Assessment & Plan Note (Signed)
We have encouraged continued exercise, careful diet management   

## 2011-10-10 ENCOUNTER — Ambulatory Visit (INDEPENDENT_AMBULATORY_CARE_PROVIDER_SITE_OTHER): Payer: Medicare Other

## 2011-10-10 DIAGNOSIS — I4891 Unspecified atrial fibrillation: Secondary | ICD-10-CM

## 2011-10-10 DIAGNOSIS — Z7901 Long term (current) use of anticoagulants: Secondary | ICD-10-CM

## 2011-10-10 DIAGNOSIS — G459 Transient cerebral ischemic attack, unspecified: Secondary | ICD-10-CM

## 2011-10-10 LAB — BASIC METABOLIC PANEL
BUN: 22 mg/dL (ref 8–27)
CO2: 26 mmol/L (ref 20–32)
Calcium: 9.3 mg/dL (ref 8.6–10.2)
Chloride: 98 mmol/L (ref 97–108)
Creatinine, Ser: 1.02 mg/dL (ref 0.76–1.27)
Glucose: 74 mg/dL (ref 65–99)

## 2011-10-10 LAB — CBC
MCH: 30.6 pg (ref 26.6–33.0)
Platelets: 143 10*3/uL (ref 140–415)
RBC: 4.94 x10E6/uL (ref 4.14–5.80)
WBC: 5.8 10*3/uL (ref 4.0–10.5)

## 2011-10-10 LAB — POCT INR: INR: 3

## 2011-10-15 ENCOUNTER — Ambulatory Visit (INDEPENDENT_AMBULATORY_CARE_PROVIDER_SITE_OTHER): Payer: Medicare Other

## 2011-10-15 DIAGNOSIS — G459 Transient cerebral ischemic attack, unspecified: Secondary | ICD-10-CM

## 2011-10-15 DIAGNOSIS — I4891 Unspecified atrial fibrillation: Secondary | ICD-10-CM

## 2011-10-15 DIAGNOSIS — Z7901 Long term (current) use of anticoagulants: Secondary | ICD-10-CM

## 2011-10-15 LAB — POCT INR: INR: 3.8

## 2011-10-20 ENCOUNTER — Ambulatory Visit: Payer: Self-pay | Admitting: Cardiovascular Disease

## 2011-10-20 DIAGNOSIS — I4891 Unspecified atrial fibrillation: Secondary | ICD-10-CM

## 2011-10-20 LAB — PROTIME-INR: Prothrombin Time: 29.7 secs — ABNORMAL HIGH (ref 11.5–14.7)

## 2011-10-21 ENCOUNTER — Ambulatory Visit (INDEPENDENT_AMBULATORY_CARE_PROVIDER_SITE_OTHER): Payer: Medicare Other

## 2011-10-21 ENCOUNTER — Telehealth: Payer: Self-pay | Admitting: Cardiovascular Disease

## 2011-10-21 VITALS — BP 98/62 | HR 54 | Ht 69.0 in | Wt 178.0 lb

## 2011-10-21 DIAGNOSIS — G459 Transient cerebral ischemic attack, unspecified: Secondary | ICD-10-CM

## 2011-10-21 DIAGNOSIS — I4891 Unspecified atrial fibrillation: Secondary | ICD-10-CM

## 2011-10-21 DIAGNOSIS — Z7901 Long term (current) use of anticoagulants: Secondary | ICD-10-CM

## 2011-10-21 LAB — POCT INR: INR: 4.7

## 2011-10-21 NOTE — Patient Instructions (Addendum)
Hold the Isosorbide mono 30 mg tonight.  Start back the Isosorbide mono 30 mg tomorrow night.  Take an extra fluid pill (Lasix) tonight. If you notice swelling tomorrow, take another extra fluid pill (Lasix). Eat a banana when taking the extra fluid pill (Lasix). Be careful with your fluid intake.  Call the office if you notice any other changes in your shortness of breath or swelling in legs, feet and ankles. Also monitor your blood pressure and heart rate at home.

## 2011-10-21 NOTE — Telephone Encounter (Signed)
Pt states that since his cardioversion yesterday at [facility] he has become so short of breath that he cannot lay down with out feeling like he is suffocating.  Pt is audibly short of breath while talking to me on the telephone.  Asked Asher Muir at the [place] office to make contact with Dr. Mariah Milling for advice.  Pt has refused to go to the ER, states it is too expensive and he would die at home before going there and being charged too much money.  Will forward this note to Dr. Mariah Milling.

## 2011-10-21 NOTE — Telephone Encounter (Signed)
Pt had a cardioversion yesterday at [facility]. Pt states that he is having severe SOB.

## 2011-10-21 NOTE — Telephone Encounter (Signed)
The patient gets more shortness of breath when lying down for the first 5-10 min then seems to get a little better but still short of breath.  He does not know if heart irregular and does not have a blood pressure cuff to tell what his blood pressure or heart rate is. He does not know how to check his pulse and I did try to explain how to check it.  Told the patient need to have someone bring him to the office for an EKG since he did refuse the ER and did not want the EMS to come to house for evaluation.

## 2011-10-21 NOTE — Telephone Encounter (Signed)
Seen today Still in NSR rate 55 bpm Suggested he take extra lasix bid for several days Hold imdur tonight, restart tomorrow for low BP today (he took isosorbide twice yesterday)

## 2011-10-24 ENCOUNTER — Telehealth: Payer: Self-pay | Admitting: Cardiovascular Disease

## 2011-10-24 NOTE — Telephone Encounter (Signed)
Pt denies dizziness, had SOB last night with a little wheezing but better today. Told pt Dr Mariah Milling needs more bp/ pulse readings to accurately evaluate and adjust medications. Pt was asked to get bp/p morning and evening and call Monday with results. Pt agreed to plan.

## 2011-10-24 NOTE — Telephone Encounter (Signed)
Pulse 51 and BP 111/43 wants to know if this is to low for him?

## 2011-10-27 ENCOUNTER — Telehealth: Payer: Self-pay | Admitting: Cardiovascular Disease

## 2011-10-27 NOTE — Telephone Encounter (Signed)
Patient called back and wanted to let you know he has gained 12 pounds since the last time he saw you in clinic.

## 2011-10-27 NOTE — Telephone Encounter (Signed)
Patient called to give report to Dr. Mariah Milling that his heart rate has been at 45 for the past week, his blood pressure he said was doing good.  He also reports continued swelling in his legs, feet, and lower abdomen.  Do you have any recommendations for him?

## 2011-10-28 NOTE — Telephone Encounter (Signed)
Spoke with patient, Instructions given to decreased Amiodarone to 200 mg once a day , also pt to cut back on his fluid intake. Patient is taken lasix daily and an extra dose in the AM for LE AND LOWER ABDOMEN swelling. PATIENT IS NOT TAKEN  POTASSIUM. PATIENT STATES IS HAVING sob WHEN LYING IN BED. THE SWELLING IS THE SAME. JAMIE CALLED AT [facility] OFFICE FOR APPOINTMENT. At 1:50 PM pt was called for appointment status. According to Patient he has an appointment tomorrow in [place] at 1:30 PM patient aware.

## 2011-10-28 NOTE — Telephone Encounter (Signed)
Would decrease amiodarone to 200 mg daily Make sure that he is taking Lasix daily with potassium. Cutback on his fluid intake. If fluid persists especially if he has shortness of breath, he will need followup in clinic

## 2011-10-29 ENCOUNTER — Ambulatory Visit (INDEPENDENT_AMBULATORY_CARE_PROVIDER_SITE_OTHER): Payer: Medicare Other | Admitting: Cardiovascular Disease

## 2011-10-29 ENCOUNTER — Encounter: Payer: Self-pay | Admitting: Cardiovascular Disease

## 2011-10-29 ENCOUNTER — Ambulatory Visit (INDEPENDENT_AMBULATORY_CARE_PROVIDER_SITE_OTHER): Payer: Medicare Other

## 2011-10-29 VITALS — BP 125/69 | HR 51 | Resp 16 | Ht 69.0 in | Wt 192.0 lb

## 2011-10-29 DIAGNOSIS — Z7901 Long term (current) use of anticoagulants: Secondary | ICD-10-CM

## 2011-10-29 DIAGNOSIS — I4891 Unspecified atrial fibrillation: Secondary | ICD-10-CM

## 2011-10-29 DIAGNOSIS — E119 Type 2 diabetes mellitus without complications: Secondary | ICD-10-CM

## 2011-10-29 DIAGNOSIS — I2510 Atherosclerotic heart disease of native coronary artery without angina pectoris: Secondary | ICD-10-CM

## 2011-10-29 DIAGNOSIS — G459 Transient cerebral ischemic attack, unspecified: Secondary | ICD-10-CM

## 2011-10-29 DIAGNOSIS — R609 Edema, unspecified: Secondary | ICD-10-CM

## 2011-10-29 LAB — POCT INR: INR: 4

## 2011-10-29 MED ORDER — POTASSIUM CHLORIDE ER 10 MEQ PO TBCR: 10.0000 meq | EXTENDED_RELEASE_TABLET | Freq: Two times a day (BID) | ORAL | Status: DC

## 2011-10-29 MED ORDER — CARVEDILOL 6.25 MG PO TABS: 6.2500 mg | ORAL_TABLET | Freq: Two times a day (BID) | ORAL | Status: DC

## 2011-10-29 NOTE — Assessment & Plan Note (Signed)
Currently with no symptoms of angina. No further workup at this time. Continue current medication regimen. 

## 2011-10-29 NOTE — Assessment & Plan Note (Signed)
We'll continue warfarin for now given recent cardioversion.

## 2011-10-29 NOTE — Progress Notes (Signed)
Patient ID: [name], male    DOB: [birth year], 71 y.o.   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 Inferior wall defect was seen on stress test in 2011.  Hx of nightmares which wake him up. He is also quite concerned about a feeling of being cold on half of his body depending on which way he turns in bed.   in early 2011 he was in normal sinus rhythm. On a visit to Dr. Alphonsus [name] in 2012 he was noted to be in atrial fibrillation. He has increased shortness of breath. He has had some chronic SOB with previous workup in 2011 echocardiogram and stress test . He has been on anticoagulation with INR around 3. cardioversion done 3/18 that was successful. He has maintained sinus rhythm since that time but has been having bradycardia.  Since the cardioversion, he has had worsening edema, weight gain, abdominal swelling now with mild shortness of breath. He reports Heart rates in the 40s at home with some dizziness.  EKG shows sinus bradycardia with rate 51 beats per minute with nonspecific ST abnormality, interventricular conduction delay   Outpatient Encounter Prescriptions as of 10/29/2011  Medication Sig Dispense Refill  . amiodarone (PACERONE) 200 MG tablet Take 200 mg by mouth daily.      Marland Kitchen aspirin 81 MG tablet Take 81 mg by mouth daily.        Marland Kitchen atorvastatin (LIPITOR) 20 MG tablet Take 1 tablet (20 mg total) by mouth daily.  30 tablet  11  . B Complex-C (B-COMPLEX WITH VITAMIN C) tablet Take 1 tablet by mouth daily.        . carvedilol (COREG) 6.25 MG tablet Take 1 tablet (6.25 mg total) by mouth 2 (two) times daily with a meal.  60 tablet  11  . digoxin  (LANOXIN) 0.25 MG tablet Take 1 tablet (250 mcg total) by mouth daily.  30 tablet  11  . furosemide (LASIX) 40 MG tablet Take 1 tablet (40 mg total) by mouth daily.  30 tablet  11  . glipiZIDE (GLUCOTROL) 10 MG 24 hr tablet Take 1 tablet (10 mg total) by mouth daily.  30 tablet  11  . glucose blood (RELION PRIME TEST) test strip Test blood sugar 3-5 times daily, dx: 250.00  200 each  11  . insulin glargine (LANTUS) 100 UNIT/ML injection Inject 25 Units into the skin at bedtime.  10 mL  11  . insulin regular (HUMULIN R) 100 UNIT/ML injection Inject 2-6 Units into the skin 3 (three) times daily before meals. Before meals---2 units for 140-180, 4 units for 181-220, 6 units if over 220      . Insulin Syringe-Needle U-100 (BD INSULIN SYRINGE ULTRAFINE) 31G X 5/16" 1 ML MISC USE AS DIRECTED  100 each  11  . isosorbide mononitrate (IMDUR) 30 MG 24 hr tablet Take 1 tablet (30 mg total) by mouth daily.  30 tablet  6  . LORazepam (ATIVAN) 0.5 MG tablet Take one half to one tablet by mouth every night at bedtime as needed to help sleep.  30 tablet  1  . Multiple Vitamin (MULTIVITAMIN) tablet Take 1 tablet by mouth daily.      . Omega-3 Fatty Acids (  FISH OIL) 1000 MG CAPS Take 1,000 mg by mouth daily.      . quinapril (ACCUPRIL) 10 MG tablet Take 1 tablet (10 mg total) by mouth daily.  90 tablet  3  . triamcinolone (KENALOG) 0.1 % cream Apply topically 2 (two) times daily.        Marland Kitchen warfarin (COUMADIN) 5 MG tablet Take as directed by anticoagulation clinic  50 tablet  3  . : amiodarone (PACERONE) 200 MG tablet Take 1 tablet (200 mg total) by mouth 2 (two) times daily.  60 tablet  3  .  carvedilol (COREG) 12.5 MG tablet Take 1 tablet (12.5 mg total) by mouth 2 (two) times daily with a meal.  60 tablet  11    Review of Systems  HENT: Negative.   Eyes: Negative.   Respiratory: Positive for shortness of breath.   Cardiovascular: Positive for leg swelling.  Gastrointestinal: Positive for abdominal distention.    Musculoskeletal: Negative.   Skin: Negative.   Neurological: Negative.   Hematological: Negative.   Psychiatric/Behavioral: Negative.   All other systems reviewed and are negative.    Pulse 51  Ht 5\' 9"  (1.753 m)  Wt 192 lb (87.091 kg)  BMI 28.35 kg/m2  Physical Exam  Nursing note and vitals reviewed. Constitutional: He is oriented to person, place, and time. He appears well-developed and well-nourished.  HENT:  Head: Normocephalic.  Nose: Nose normal.  Mouth/Throat: Oropharynx is clear and moist.  Eyes: Conjunctivae are normal. Pupils are equal, round, and reactive to light.  Neck: Normal range of motion. Neck supple. No JVD present.  Cardiovascular: Normal rate, S1 normal, S2 normal and intact distal pulses.  An irregularly irregular rhythm present. Exam reveals no gallop and no friction rub.   Murmur heard.  Crescendo systolic murmur is present with a grade of 2/6  Pulmonary/Chest: Effort normal and breath sounds normal. No respiratory distress. He has no wheezes. He has no rales. He exhibits no tenderness.  Abdominal: Soft. Bowel sounds are normal. He exhibits no distension. There is no tenderness.  Musculoskeletal: Normal range of motion. He exhibits no edema and no tenderness.  Lymphadenopathy:    He has no cervical adenopathy.  Neurological: He is alert and oriented to person, place, and time. Coordination normal.  Skin: Skin is warm and dry. No rash noted. No erythema.  Psychiatric: He has a normal mood and affect. His behavior is normal. Judgment and thought content normal.           Assessment and Plan

## 2011-10-29 NOTE — Assessment & Plan Note (Signed)
He is maintaining normal sinus rhythm, now sinus bradycardia. Will decrease the amiodarone to 200 mg daily, hold his digoxin, decrease the Coreg to 6.25 mg twice a day.

## 2011-10-29 NOTE — Assessment & Plan Note (Addendum)
Worsening edema, abdominal swelling, shortness of breath is likely secondary to profound bradycardia and diastolic dysfunction. We have made medication changes as above. We have also suggested he change the Lasix to twice a day. We'll add potassium twice a day.  If his symptoms do not start to improve, we will repeat an echocardiogram.

## 2011-10-29 NOTE — Patient Instructions (Addendum)
Please increase the lasix to twice a day Take potassium twice a day with lasix  Decrease the amiodarone to one a day Stop the digoxin Cut the coreg in 1/2 and take 6.25 twice a day Monitor your heart rate at home  Please call us if you have new issues that need to be addressed before your next appt.  Your physician wants you to follow-up in: 1 months.  You will receive a reminder letter in the mail two months in advance. If you don't receive a letter, please call our office to schedule the follow-up appointment.

## 2011-10-29 NOTE — Assessment & Plan Note (Signed)
We have encouraged continued exercise, careful diet management in an effort to lose weight. 

## 2011-11-03 ENCOUNTER — Other Ambulatory Visit: Payer: Self-pay | Admitting: *Deleted

## 2011-11-03 MED ORDER — LORAZEPAM 0.5 MG PO TABS: ORAL_TABLET | ORAL | Status: DC

## 2011-11-03 NOTE — Telephone Encounter (Signed)
Okay #30 x 1 

## 2011-11-03 NOTE — Telephone Encounter (Signed)
rx called into pharmacy

## 2011-11-05 ENCOUNTER — Ambulatory Visit (INDEPENDENT_AMBULATORY_CARE_PROVIDER_SITE_OTHER): Payer: Medicare Other

## 2011-11-05 DIAGNOSIS — Z7901 Long term (current) use of anticoagulants: Secondary | ICD-10-CM

## 2011-11-05 DIAGNOSIS — I4891 Unspecified atrial fibrillation: Secondary | ICD-10-CM

## 2011-11-05 DIAGNOSIS — G459 Transient cerebral ischemic attack, unspecified: Secondary | ICD-10-CM

## 2011-11-05 LAB — POCT INR: INR: 3.7

## 2011-11-05 NOTE — Patient Instructions (Signed)
Per Dr Windell Hummingbird instructions from office visit on 10/29/11:  Please increase the lasix to twice a day  Take potassium twice a day with lasix  Decrease the amiodarone to one a day  Stop the digoxin  Cut the coreg in 1/2 and take 6.25 twice a day  Monitor your heart rate at home

## 2011-11-11 ENCOUNTER — Ambulatory Visit: Payer: Medicare Other | Admitting: Cardiovascular Disease

## 2011-11-12 ENCOUNTER — Ambulatory Visit (INDEPENDENT_AMBULATORY_CARE_PROVIDER_SITE_OTHER): Payer: Medicare Other

## 2011-11-12 DIAGNOSIS — I4891 Unspecified atrial fibrillation: Secondary | ICD-10-CM

## 2011-11-12 DIAGNOSIS — G459 Transient cerebral ischemic attack, unspecified: Secondary | ICD-10-CM

## 2011-11-12 DIAGNOSIS — Z7901 Long term (current) use of anticoagulants: Secondary | ICD-10-CM

## 2011-11-12 LAB — POCT INR: INR: 2.3

## 2011-11-26 ENCOUNTER — Ambulatory Visit (INDEPENDENT_AMBULATORY_CARE_PROVIDER_SITE_OTHER): Payer: Medicare Other

## 2011-11-26 DIAGNOSIS — G459 Transient cerebral ischemic attack, unspecified: Secondary | ICD-10-CM

## 2011-11-26 DIAGNOSIS — Z7901 Long term (current) use of anticoagulants: Secondary | ICD-10-CM

## 2011-11-26 DIAGNOSIS — I4891 Unspecified atrial fibrillation: Secondary | ICD-10-CM

## 2011-12-02 ENCOUNTER — Ambulatory Visit (INDEPENDENT_AMBULATORY_CARE_PROVIDER_SITE_OTHER): Payer: Medicare Other | Admitting: Cardiovascular Disease

## 2011-12-02 ENCOUNTER — Encounter: Payer: Self-pay | Admitting: Cardiovascular Disease

## 2011-12-02 VITALS — BP 102/54 | HR 58 | Ht 69.0 in | Wt 173.5 lb

## 2011-12-02 DIAGNOSIS — E119 Type 2 diabetes mellitus without complications: Secondary | ICD-10-CM

## 2011-12-02 DIAGNOSIS — I4891 Unspecified atrial fibrillation: Secondary | ICD-10-CM

## 2011-12-02 DIAGNOSIS — I5031 Acute diastolic (congestive) heart failure: Secondary | ICD-10-CM

## 2011-12-02 DIAGNOSIS — E785 Hyperlipidemia, unspecified: Secondary | ICD-10-CM

## 2011-12-02 DIAGNOSIS — I10 Essential (primary) hypertension: Secondary | ICD-10-CM

## 2011-12-02 NOTE — Assessment & Plan Note (Signed)
Recent 20 pound weight loss with diuresis. We have suggested he decrease the Lasix to every other day with potassium every other day. He will monitor his blood pressure and weight. He will hold the Lasix for weight less than 170 pounds.

## 2011-12-02 NOTE — Patient Instructions (Signed)
Continue to monitor your weight Please take lasix every other day If you get below 170 lbs, stop the diuretic  If blood pressure drops, you can cut the isosorbide in 1/2 or stop the isosorbide  Please call us if you have new issues that need to be addressed before your next appt.  Your physician wants you to follow-up in: 6 months.  You will receive a reminder letter in the mail two months in advance. If you don't receive a letter, please call our office to schedule the follow-up appointment.

## 2011-12-02 NOTE — Assessment & Plan Note (Signed)
He is maintaining normal sinus rhythm on clinical exam today. No changes made to his medications.

## 2011-12-02 NOTE — Assessment & Plan Note (Signed)
We have encouraged continued exercise, careful diet management in an effort to lose weight. 

## 2011-12-02 NOTE — Progress Notes (Signed)
Patient ID: [name], male    DOB: 10-11-1940, 71 y.o.   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 Inferior wall defect was seen on stress test in 2011.  Hx of nightmares which wake him up. He is also quite concerned about a feeling of being cold on half of his body depending on which way he turns in bed.   in early 2011 he was in normal sinus rhythm. On a visit to Dr. Alphonsus Sias in 2012 he was noted to be in atrial fibrillation. He has increased shortness of breath. He has had some chronic SOB with previous workup in 2011 echocardiogram and stress test . He has been on anticoagulation with INR around 3. cardioversion done 3/18 that was successful.  He has maintained sinus rhythm since that time   Following the cardioversion, he had significant weight gain, lower extremity edema. He was started on Lasix twice a day with potassium. Since then he has had a 20 pound weight loss with improvement of his symptoms. He is now only taking Lasix daily. Shortness of breath has resolved. Overall he feels well   Outpatient Encounter Prescriptions as of 12/02/2011  Medication Sig Dispense Refill  . amiodarone (PACERONE) 200 MG tablet Take 200 mg by mouth daily.      Marland Kitchen aspirin 81 MG tablet Take 81 mg by mouth daily.        Marland Kitchen atorvastatin (LIPITOR) 20 MG tablet Take 1 tablet (20 mg total) by mouth daily.  30 tablet  11  . B Complex-C (B-COMPLEX WITH VITAMIN C) tablet Take 1 tablet by mouth daily.        . carvedilol (COREG) 6.25 MG tablet Take 1 tablet (6.25 mg total) by mouth 2 (two) times daily with a meal.  60 tablet  11  . furosemide (LASIX) 40 MG tablet Take 1 tablet  (40 mg total) by mouth daily.  30 tablet  11  . glipiZIDE (GLUCOTROL) 10 MG 24 hr tablet Take 1 tablet (10 mg total) by mouth daily.  30 tablet  11  . glucose blood (RELION PRIME TEST) test strip Test blood sugar 3-5 times daily, dx: 250.00  200 each  11  . insulin glargine (LANTUS) 100 UNIT/ML injection Inject 25 Units into the skin at bedtime.  10 mL  11  . insulin regular (HUMULIN R) 100 UNIT/ML injection Inject 2-6 Units into the skin 3 (three) times daily before meals. Before meals---2 units for 140-180, 4 units for 181-220, 6 units if over 220      . Insulin Syringe-Needle U-100 (BD INSULIN SYRINGE ULTRAFINE) 31G X 5/16" 1 ML MISC USE AS DIRECTED  100 each  11  . isosorbide mononitrate (IMDUR) 30 MG 24 hr tablet Take 1 tablet (30 mg total) by mouth daily.  30 tablet  6  . LORazepam (ATIVAN) 0.5 MG tablet Take one half to one tablet by mouth every night at bedtime as needed to help sleep.  30 tablet  1  . Multiple Vitamin (MULTIVITAMIN) tablet Take 1 tablet by mouth daily.      . Omega-3 Fatty Acids (FISH OIL) 1000 MG CAPS Take 1,000 mg by mouth daily.      . potassium chloride (K-DUR)  10 MEQ tablet Take 1 tablet (10 mEq total) by mouth 2 (two) times daily.  60 tablet  6  . quinapril (ACCUPRIL) 10 MG tablet Take 1 tablet (10 mg total) by mouth daily.  90 tablet  3  . triamcinolone (KENALOG) 0.1 % cream Apply topically 2 (two) times daily.        Marland Kitchen warfarin (COUMADIN) 5 MG tablet Take as directed by anticoagulation clinic  50 tablet  3   Review of Systems  Constitutional: Negative.   HENT: Negative.   Eyes: Negative.   Musculoskeletal: Negative.   Skin: Negative.   Neurological: Negative.   Hematological: Negative.   Psychiatric/Behavioral: Negative.   All other systems reviewed and are negative.    BP 102/54  Pulse 58  Ht 5\' 9"  (1.753 m)  Wt 173 lb 8 oz (78.699 kg)  BMI 25.62 kg/m2  Physical Exam  Nursing note and vitals reviewed. Constitutional: He is oriented to person,  place, and time. He appears well-developed and well-nourished.  HENT:  Head: Normocephalic.  Nose: Nose normal.  Mouth/Throat: Oropharynx is clear and moist.  Eyes: Conjunctivae are normal. Pupils are equal, round, and reactive to light.  Neck: Normal range of motion. Neck supple. No JVD present.  Cardiovascular: Normal rate, regular rhythm, S1 normal, S2 normal and intact distal pulses.  Exam reveals no gallop and no friction rub.   Murmur heard.  Crescendo systolic murmur is present with a grade of 2/6  Pulmonary/Chest: Effort normal and breath sounds normal. No respiratory distress. He has no wheezes. He has no rales. He exhibits no tenderness.  Abdominal: Soft. Bowel sounds are normal. He exhibits no distension. There is no tenderness.  Musculoskeletal: Normal range of motion. He exhibits no edema and no tenderness.  Lymphadenopathy:    He has no cervical adenopathy.  Neurological: He is alert and oriented to person, place, and time. Coordination normal.  Skin: Skin is warm and dry. No rash noted. No erythema.  Psychiatric: He has a normal mood and affect. His behavior is normal. Judgment and thought content normal.           Assessment and Plan

## 2011-12-02 NOTE — Assessment & Plan Note (Signed)
Blood pressure is running somewhat low on today's visit. We have suggested he cut his isosorbide in half. If it continues to run low, he could hold the isosorbide.

## 2011-12-02 NOTE — Assessment & Plan Note (Signed)
We have suggested he stay on his Lipitor 

## 2011-12-10 ENCOUNTER — Ambulatory Visit (INDEPENDENT_AMBULATORY_CARE_PROVIDER_SITE_OTHER): Payer: Medicare Other

## 2011-12-10 DIAGNOSIS — G459 Transient cerebral ischemic attack, unspecified: Secondary | ICD-10-CM

## 2011-12-10 DIAGNOSIS — Z7901 Long term (current) use of anticoagulants: Secondary | ICD-10-CM

## 2011-12-10 DIAGNOSIS — I4891 Unspecified atrial fibrillation: Secondary | ICD-10-CM

## 2011-12-10 LAB — POCT INR: INR: 3

## 2011-12-11 ENCOUNTER — Encounter: Payer: Self-pay | Admitting: Cardiovascular Disease

## 2011-12-16 ENCOUNTER — Telehealth: Payer: Self-pay | Admitting: Internal Medicine

## 2011-12-16 NOTE — Telephone Encounter (Signed)
Pt calling on 12/16/11/is c/o leg/groin/pain around to his back/no apparent injury. Pt was asking about taking medication for pain/advised he should be seen per Hip-Non Injury Protocol/appt scheduled for 1115 (pt was not able to have earlier appt due to family obligations)

## 2011-12-17 ENCOUNTER — Encounter: Payer: Self-pay | Admitting: Internal Medicine

## 2011-12-17 ENCOUNTER — Ambulatory Visit (INDEPENDENT_AMBULATORY_CARE_PROVIDER_SITE_OTHER): Payer: Medicare Other | Admitting: Internal Medicine

## 2011-12-17 VITALS — BP 106/60 | HR 60 | Temp 97.8°F | Wt 178.0 lb

## 2011-12-17 DIAGNOSIS — N419 Inflammatory disease of prostate, unspecified: Secondary | ICD-10-CM | POA: Insufficient documentation

## 2011-12-17 MED ORDER — DOXYCYCLINE HYCLATE 100 MG PO TABS: 100.0000 mg | ORAL_TABLET | Freq: Two times a day (BID) | ORAL | Status: AC

## 2011-12-17 NOTE — Telephone Encounter (Signed)
Okay Will evaluate then 

## 2011-12-17 NOTE — Patient Instructions (Signed)
Please set up protime here on Monday (and we will continue to check them here from now on) Just started antibiotic so needs check soon

## 2011-12-17 NOTE — Assessment & Plan Note (Addendum)
Seems to have acute infection as well as mild orchitis as well Will treat with doxy for 3 weeks Check protime next Monday

## 2011-12-17 NOTE — Progress Notes (Signed)
Subjective:    Patient ID: [name], male    DOB: 1941/01/31, 71 y.o.   MRN: [id number]  HPI Developed some groin pain--started 3-4 days ago Trouble moving and even bending to tie shoe Groin pain on right and around to back Tylenol yesterday did help Better this am  Pain in right testicle and back No scrotal swelling Some pain with sitting No urinary freq or dysuria or hematuria No fever  Current Outpatient Prescriptions on File Prior to Visit  Medication Sig Dispense Refill  . amiodarone (PACERONE) 200 MG tablet Take 200 mg by mouth daily.      Marland Kitchen aspirin 81 MG tablet Take 81 mg by mouth daily.        Marland Kitchen atorvastatin (LIPITOR) 20 MG tablet Take 1 tablet (20 mg total) by mouth daily.  30 tablet  11  . B Complex-C (B-COMPLEX WITH VITAMIN C) tablet Take 1 tablet by mouth daily.        . carvedilol (COREG) 6.25 MG tablet Take 1 tablet (6.25 mg total) by mouth 2 (two) times daily with a meal.  60 tablet  11  . furosemide (LASIX) 40 MG tablet Take 1 tablet (40 mg total) by mouth daily.  30 tablet  11  . glipiZIDE (GLUCOTROL) 10 MG 24 hr tablet Take 1 tablet (10 mg total) by mouth daily.  30 tablet  11  . glucose blood (RELION PRIME TEST) test strip Test blood sugar 3-5 times daily, dx: 250.00  200 each  11  . insulin glargine (LANTUS) 100 UNIT/ML injection Inject 25 Units into the skin at bedtime.  10 mL  11  . insulin regular (HUMULIN R) 100 UNIT/ML injection Inject 2-6 Units into the skin 3 (three) times daily before meals. Before meals---2 units for 140-180, 4 units for 181-220, 6 units if over 220      . Insulin Syringe-Needle U-100 (BD INSULIN SYRINGE ULTRAFINE) 31G X 5/16" 1 ML MISC USE AS DIRECTED  100 each  11  . LORazepam (ATIVAN) 0.5 MG tablet Take one half to one tablet by mouth every night at bedtime as needed to help sleep.  30 tablet  1  . Multiple Vitamin (MULTIVITAMIN) tablet Take 1 tablet by mouth daily.      . Omega-3 Fatty Acids (FISH OIL) 1000 MG CAPS Take 1,000 mg  by mouth daily.      . potassium chloride (K-DUR) 10 MEQ tablet Take 1 tablet (10 mEq total) by mouth 2 (two) times daily.  60 tablet  6  . quinapril (ACCUPRIL) 10 MG tablet Take 1 tablet (10 mg total) by mouth daily.  90 tablet  3  . triamcinolone (KENALOG) 0.1 % cream Apply topically 2 (two) times daily.        Marland Kitchen warfarin (COUMADIN) 5 MG tablet Take as directed by anticoagulation clinic  50 tablet  3  . DISCONTD: isosorbide mononitrate (IMDUR) 30 MG 24 hr tablet Take 1 tablet (30 mg total) by mouth daily.  30 tablet  6    No Known Allergies  Past Medical History  Diagnosis Date  . Atrial fibrillation      S/P ablation  . CAD (coronary artery disease)     S/P MI 1980's----------------------Dr McDowell  . Hyperlipidemia   . CHF (congestive heart failure)   . 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 BPH (benign prostatic hypertrophy)   . Stroke   . Sleep disturbance     Past Surgical  History  Procedure Date  . Coronary artery bypass graft   . Appendectomy   . Tonsillectomy   . Upper gastrointestinal endoscopy     Gastritis , ? H. pylori 08/28/1999  . Atrial ablation surgery     No family history on file.  History   Social History  . Marital Status: Single    Spouse Name: N/A    Number of Children: N/A  . Years of Education: N/A   Occupational History  . disabled due to heart disease    Social History Main Topics  . Smoking status: Former Smoker -- 1.0 packs/day    Types: Cigarettes    Quit date: 07/15/1973  . Smokeless tobacco: Never Used  . Alcohol Use: No  . Drug Use: No  . Sexually Active: Not on file   Other Topics Concern  . Not on file   Social History Narrative   Single--lives aloneStays with sister with Altzheimer's frequentlyHas living willBrother, [name] has health care POAWould accept resuscitation but no prolonged artificial ventilationNo feeding tube if cognitively unaware   Review of Systems No sexual changes  or hematospermia    Objective:   Physical Exam  Constitutional: He appears well-developed and well-nourished. No distress.  Genitourinary:       Mild left testicular tenderness Slight redness Prostate is normal size but moderately tender          Assessment & Plan:

## 2011-12-22 ENCOUNTER — Ambulatory Visit (INDEPENDENT_AMBULATORY_CARE_PROVIDER_SITE_OTHER): Payer: Medicare Other | Admitting: Internal Medicine

## 2011-12-22 DIAGNOSIS — Z7901 Long term (current) use of anticoagulants: Secondary | ICD-10-CM

## 2011-12-22 DIAGNOSIS — G459 Transient cerebral ischemic attack, unspecified: Secondary | ICD-10-CM

## 2011-12-22 DIAGNOSIS — I4891 Unspecified atrial fibrillation: Secondary | ICD-10-CM

## 2011-12-22 LAB — POCT INR: INR: 3

## 2011-12-22 NOTE — Patient Instructions (Addendum)
Continue current dose, check Friday

## 2011-12-26 ENCOUNTER — Ambulatory Visit (INDEPENDENT_AMBULATORY_CARE_PROVIDER_SITE_OTHER): Payer: Medicare Other | Admitting: Family Medicine

## 2011-12-26 DIAGNOSIS — G459 Transient cerebral ischemic attack, unspecified: Secondary | ICD-10-CM

## 2011-12-26 DIAGNOSIS — I4891 Unspecified atrial fibrillation: Secondary | ICD-10-CM

## 2011-12-26 DIAGNOSIS — Z7901 Long term (current) use of anticoagulants: Secondary | ICD-10-CM

## 2011-12-26 NOTE — Patient Instructions (Signed)
(   while taking the antibiotic, cut dose in half, 2.5 mg daily and 5 mg on SUN, WED. Check next WED

## 2011-12-31 ENCOUNTER — Ambulatory Visit (INDEPENDENT_AMBULATORY_CARE_PROVIDER_SITE_OTHER): Payer: Medicare Other | Admitting: Family Medicine

## 2011-12-31 DIAGNOSIS — Z5181 Encounter for therapeutic drug level monitoring: Secondary | ICD-10-CM

## 2011-12-31 DIAGNOSIS — I4891 Unspecified atrial fibrillation: Secondary | ICD-10-CM

## 2011-12-31 DIAGNOSIS — G459 Transient cerebral ischemic attack, unspecified: Secondary | ICD-10-CM

## 2011-12-31 DIAGNOSIS — Z7901 Long term (current) use of anticoagulants: Secondary | ICD-10-CM

## 2011-12-31 NOTE — Patient Instructions (Addendum)
while taking the antibiotic, cut dose in half, 2.5 mg daily and 5 mg on SUN, WED. Check next WED half doses until antibiotic finished, check in 1 week

## 2012-01-07 ENCOUNTER — Ambulatory Visit (INDEPENDENT_AMBULATORY_CARE_PROVIDER_SITE_OTHER): Payer: Medicare Other | Admitting: Family Medicine

## 2012-01-07 DIAGNOSIS — I4891 Unspecified atrial fibrillation: Secondary | ICD-10-CM

## 2012-01-07 DIAGNOSIS — Z7901 Long term (current) use of anticoagulants: Secondary | ICD-10-CM

## 2012-01-07 DIAGNOSIS — Z5181 Encounter for therapeutic drug level monitoring: Secondary | ICD-10-CM

## 2012-01-07 DIAGNOSIS — G459 Transient cerebral ischemic attack, unspecified: Secondary | ICD-10-CM

## 2012-01-07 LAB — POCT INR: INR: 1.6

## 2012-01-07 NOTE — Patient Instructions (Signed)
Resume regular dose of 5 mg daily, 7.5 mg Sun,Wed( finished antibiotics today 2 weeks

## 2012-01-09 ENCOUNTER — Other Ambulatory Visit: Payer: Self-pay | Admitting: *Deleted

## 2012-01-09 MED ORDER — LORAZEPAM 0.5 MG PO TABS: 0.2500 mg | ORAL_TABLET | Freq: Every evening | ORAL | Status: DC | PRN

## 2012-01-09 NOTE — Telephone Encounter (Signed)
Okay #30 x 1 

## 2012-01-09 NOTE — Telephone Encounter (Signed)
rx called into pharmacy

## 2012-01-19 ENCOUNTER — Other Ambulatory Visit: Payer: Self-pay | Admitting: *Deleted

## 2012-01-19 ENCOUNTER — Encounter: Payer: Self-pay | Admitting: Internal Medicine

## 2012-01-19 ENCOUNTER — Ambulatory Visit (INDEPENDENT_AMBULATORY_CARE_PROVIDER_SITE_OTHER): Payer: Medicare Other | Admitting: Internal Medicine

## 2012-01-19 VITALS — BP 112/68 | HR 52 | Temp 98.2°F | Wt 171.0 lb

## 2012-01-19 DIAGNOSIS — G459 Transient cerebral ischemic attack, unspecified: Secondary | ICD-10-CM

## 2012-01-19 DIAGNOSIS — Z7901 Long term (current) use of anticoagulants: Secondary | ICD-10-CM

## 2012-01-19 DIAGNOSIS — I4891 Unspecified atrial fibrillation: Secondary | ICD-10-CM

## 2012-01-19 DIAGNOSIS — I2510 Atherosclerotic heart disease of native coronary artery without angina pectoris: Secondary | ICD-10-CM

## 2012-01-19 DIAGNOSIS — Z5181 Encounter for therapeutic drug level monitoring: Secondary | ICD-10-CM

## 2012-01-19 DIAGNOSIS — I5032 Chronic diastolic (congestive) heart failure: Secondary | ICD-10-CM

## 2012-01-19 DIAGNOSIS — E119 Type 2 diabetes mellitus without complications: Secondary | ICD-10-CM

## 2012-01-19 DIAGNOSIS — I5022 Chronic systolic (congestive) heart failure: Secondary | ICD-10-CM

## 2012-01-19 LAB — POCT INR: INR: 2.7

## 2012-01-19 MED ORDER — GLIPIZIDE ER 10 MG PO TB24: 10.0000 mg | ORAL_TABLET | Freq: Every day | ORAL | Status: DC

## 2012-01-19 NOTE — Assessment & Plan Note (Signed)
Hasn't been regular with his premeal insulin---even when over 140 Cut his lantus down to 22 from 25 due to some low numbers and mild hypoglycemic symptoms Will check a1c

## 2012-01-19 NOTE — Assessment & Plan Note (Signed)
Seems to be regular On coumadin

## 2012-01-19 NOTE — Assessment & Plan Note (Deleted)
Has kept the fluid off with daily furosemide

## 2012-01-19 NOTE — Assessment & Plan Note (Signed)
No angina on decreased isosorbide

## 2012-01-19 NOTE — Patient Instructions (Signed)
Continue current dose, check in 4 weeks  

## 2012-01-19 NOTE — Progress Notes (Signed)
Subjective:    Patient ID: [name], male    DOB: 05-03-1941, 71 y.o.   MRN: [id number]  HPI Prostate symptoms did resolve  Has spot between right 4th and 5th toes that doesn't heal  Present for several months No known trauma Feet hurt all the time---plantar surface with cramps, burning, etc  Checks sugars 4-5 times per day Checks before meals to adjust insulin and then as needed "A lot better" Hasn't needed that much insulin--starts at 140 Appetite and eating are about the same--does try to be careful though No sig exercise  No set chest pain but knows "sometimes something is wrong" When he lies on left side for a while---feels fluid accumulates then. Turns over but doesn't have to sit up No edema No palpitations  Current Outpatient Prescriptions on File Prior to Visit  Medication Sig Dispense Refill  . amiodarone (PACERONE) 200 MG tablet Take 200 mg by mouth daily.      Marland Kitchen aspirin 81 MG tablet Take 81 mg by mouth daily.        Marland Kitchen atorvastatin (LIPITOR) 20 MG tablet Take 1 tablet (20 mg total) by mouth daily.  30 tablet  11  . B Complex-C (B-COMPLEX WITH VITAMIN C) tablet Take 1 tablet by mouth daily.        . carvedilol (COREG) 6.25 MG tablet Take 1 tablet (6.25 mg total) by mouth 2 (two) times daily with a meal.  60 tablet  11  . furosemide (LASIX) 40 MG tablet Take 1 tablet (40 mg total) by mouth daily.  30 tablet  11  . glucose blood (RELION PRIME TEST) test strip Test blood sugar 3-5 times daily, dx: 250.00  200 each  11  . insulin glargine (LANTUS) 100 UNIT/ML injection Inject 25 Units into the skin at bedtime.  10 mL  11  . insulin regular (HUMULIN R) 100 UNIT/ML injection Inject 2-6 Units into the skin 3 (three) times daily before meals. Before meals---2 units for 140-180, 4 units for 181-220, 6 units if over 220      . Insulin Syringe-Needle U-100 (BD INSULIN SYRINGE ULTRAFINE) 31G X 5/16" 1 ML MISC USE AS DIRECTED  100 each  11  . isosorbide mononitrate (IMDUR) 30  MG 24 hr tablet Take 15 mg by mouth daily.      Marland Kitchen LORazepam (ATIVAN) 0.5 MG tablet Take 0.5-1 tablets (0.25-0.5 mg total) by mouth at bedtime as needed (sleep).  30 tablet  1  . Multiple Vitamin (MULTIVITAMIN) tablet Take 1 tablet by mouth daily.      . Omega-3 Fatty Acids (FISH OIL) 1000 MG CAPS Take 1,000 mg by mouth daily.      . potassium chloride (K-DUR) 10 MEQ tablet Take 1 tablet (10 mEq total) by mouth 2 (two) times daily.  60 tablet  6  . quinapril (ACCUPRIL) 10 MG tablet Take 1 tablet (10 mg total) by mouth daily.  90 tablet  3  . triamcinolone (KENALOG) 0.1 % cream Apply topically 2 (two) times daily.        Marland Kitchen warfarin (COUMADIN) 5 MG tablet Take as directed by anticoagulation clinic  50 tablet  3  . DISCONTD: glipiZIDE (GLUCOTROL) 10 MG 24 hr tablet Take 1 tablet (10 mg total) by mouth daily.  30 tablet  11    No Known Allergies  Past Medical History  Diagnosis Date  . Atrial fibrillation      S/P ablation  . CAD (coronary artery disease)  S/P MI 1980's----------------------Dr McDowell  . Hyperlipidemia   . CHF (congestive heart failure)   . 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 BPH (benign prostatic hypertrophy)   . Stroke   . Sleep disturbance     Past Surgical History  Procedure Date  . Coronary artery bypass graft   . Appendectomy   . Tonsillectomy   . Upper gastrointestinal endoscopy     Gastritis , ? H. pylori 08/28/1999  . Atrial ablation surgery     No family history on file.  History   Social History  . Marital Status: Single    Spouse Name: N/A    Number of Children: N/A  . Years of Education: N/A   Occupational History  . disabled due to heart disease    Social History Main Topics  . Smoking status: Former Smoker -- 1.0 packs/day    Types: Cigarettes    Quit date: 07/15/1973  . Smokeless tobacco: Never Used  . Alcohol Use: No  . Drug Use: No  . Sexually Active: Not on file   Other Topics Concern  .  Not on file   Social History Narrative   Single--lives aloneStays with sister with Altzheimer's frequentlyHas living willBrother, Gerlene Burdock has health care POAWould accept resuscitation but no prolonged artificial ventilationNo feeding tube if cognitively unaware   Review of Systems Weight fairly stable now Bowels are okay Voiding okay     Objective:   Physical Exam  Constitutional: He appears well-developed and well-nourished. No distress.  Neck: Normal range of motion. Neck supple.  Cardiovascular: Normal rate, regular rhythm and intact distal pulses.  Exam reveals no gallop.   Murmur heard.      ?soft systolic murmur along left sternal border  Pulmonary/Chest: Effort normal and breath sounds normal. No respiratory distress. He has no wheezes. He has no rales.  Abdominal: Soft. There is no tenderness.  Musculoskeletal: He exhibits no edema and no tenderness.  Lymphadenopathy:    He has no cervical adenopathy.  Psychiatric: He has a normal mood and affect. His behavior is normal. Thought content normal.          Assessment & Plan:

## 2012-01-19 NOTE — Assessment & Plan Note (Signed)
Has kept the fluid off with daily furosemide 

## 2012-01-20 ENCOUNTER — Encounter: Payer: Self-pay | Admitting: *Deleted

## 2012-01-20 LAB — HEMOGLOBIN A1C: Hgb A1c MFr Bld: 7.7 % — ABNORMAL HIGH (ref 4.6–6.5)

## 2012-01-21 ENCOUNTER — Telehealth: Payer: Self-pay

## 2012-01-21 NOTE — Telephone Encounter (Signed)
Spoke with patient and he understands and will call if he see blood again.

## 2012-01-21 NOTE — Telephone Encounter (Signed)
If just clearly a spot of red blood--it is probably from small injury with the hard stool My records show his last colonoscopy over 10 years ago Even on coumadin, if he sees blood again, we should set up another colonoscopy to be sure there is nothing worrisome---esp if the stool is black or the blood is mixed in the stool

## 2012-01-21 NOTE — Telephone Encounter (Signed)
On 01/20/12 saw one streak of bright red blood in formed hard stool. Pt was constipated and strained to have BM. Pt had normal BM this AM with no blood but pt on coumadin and wanted Dr Alphonsus Sias to know. [facility] [employer].

## 2012-02-03 ENCOUNTER — Other Ambulatory Visit: Payer: Self-pay | Admitting: *Deleted

## 2012-02-03 MED ORDER — INSULIN REGULAR HUMAN 100 UNIT/ML IJ SOLN: 2.0000 [IU] | Freq: Three times a day (TID) | INTRAMUSCULAR | Status: DC

## 2012-02-14 ENCOUNTER — Other Ambulatory Visit: Payer: Self-pay | Admitting: Cardiovascular Disease

## 2012-02-16 ENCOUNTER — Ambulatory Visit (INDEPENDENT_AMBULATORY_CARE_PROVIDER_SITE_OTHER): Payer: Medicare Other | Admitting: Internal Medicine

## 2012-02-16 ENCOUNTER — Other Ambulatory Visit: Payer: Self-pay | Admitting: *Deleted

## 2012-02-16 ENCOUNTER — Encounter: Payer: Self-pay | Admitting: Family Medicine

## 2012-02-16 ENCOUNTER — Ambulatory Visit: Payer: Medicare Other

## 2012-02-16 ENCOUNTER — Ambulatory Visit (INDEPENDENT_AMBULATORY_CARE_PROVIDER_SITE_OTHER): Payer: Medicare Other | Admitting: Family Medicine

## 2012-02-16 VITALS — BP 120/68 | HR 60 | Temp 98.4°F | Ht 69.0 in | Wt 179.1 lb

## 2012-02-16 DIAGNOSIS — I5031 Acute diastolic (congestive) heart failure: Secondary | ICD-10-CM

## 2012-02-16 DIAGNOSIS — R0602 Shortness of breath: Secondary | ICD-10-CM

## 2012-02-16 DIAGNOSIS — Z7901 Long term (current) use of anticoagulants: Secondary | ICD-10-CM

## 2012-02-16 DIAGNOSIS — Z5181 Encounter for therapeutic drug level monitoring: Secondary | ICD-10-CM

## 2012-02-16 DIAGNOSIS — I5020 Unspecified systolic (congestive) heart failure: Secondary | ICD-10-CM

## 2012-02-16 DIAGNOSIS — I4891 Unspecified atrial fibrillation: Secondary | ICD-10-CM

## 2012-02-16 DIAGNOSIS — I509 Heart failure, unspecified: Secondary | ICD-10-CM

## 2012-02-16 DIAGNOSIS — G459 Transient cerebral ischemic attack, unspecified: Secondary | ICD-10-CM

## 2012-02-16 LAB — POCT INR: INR: 1.7

## 2012-02-16 MED ORDER — ISOSORBIDE MONONITRATE ER 30 MG PO TB24: 15.0000 mg | ORAL_TABLET | Freq: Every day | ORAL | Status: DC

## 2012-02-16 NOTE — Telephone Encounter (Signed)
Refilled Isosorbide. 

## 2012-02-16 NOTE — Progress Notes (Signed)
Nature conservation officer at [facility] [address] [place] [address] Phone: [phone] Fax: [phone]  Date:  02/16/2012   Name:  [name]   DOB:  10/24/1940   MRN:  [id number]  PCP:  Tillman Abide, MD    Chief Complaint: Shortness of Breath   History of Present Illness:  [name] is a 71 y.o. very pleasant male patient who presents with the following:  With laying down, was having a lot of wheezes, and sounds, now has cleared.   I was asked to see the patient urgently. He had some worsening shortness of breath yesterday. Yesterday today he has had a lot of urine production, and his breathing has improved. His pulse ox is 96% here in the office. His respiration rate is less than 20.  He is currently not wheezing. He does not use any regular inhalers.  He has not been febrile. He denies any current chest pain.  Patient Active Problem List  Diagnosis  . HYPERLIPIDEMIA  . NEUROPATHY  . CORONARY ARTERY DISEASE  . ATRIAL FIBRILLATION  . TIA  . EMPHYSEMA  . BENIGN PROSTATIC HYPERTROPHY  . SEBORRHEA  . SLEEP DISORDER  . ELECTROCARDIOGRAM, ABNORMAL  . Type 2 diabetes mellitus  . Routine general medical examination at a health care facility  . Chest pain  . Encounter for long-term (current) use of anticoagulants  . Edema  . Acute diastolic CHF (congestive heart failure)  . HTN (hypertension)  . Chronic diastolic heart failure    Past Medical History  Diagnosis Date  . Atrial fibrillation      S/P ablation  . CAD (coronary artery disease)     S/P MI 1980's----------------------Dr McDowell  . Hyperlipidemia   . CHF (congestive heart failure)   . 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 BPH (benign prostatic hypertrophy)   . Stroke   . Sleep disturbance     Past Surgical History  Procedure Date  . Coronary artery bypass graft   . Appendectomy   . Tonsillectomy   . Upper gastrointestinal endoscopy    Gastritis , ? H. pylori 08/28/1999  . Atrial ablation surgery     History  Substance Use Topics  . Smoking status: Former Smoker -- 1.0 packs/day    Types: Cigarettes    Quit date: 07/15/1973  . Smokeless tobacco: Never Used  . Alcohol Use: No    No family history on file.  No Known Allergies  Medication list has been reviewed and updated.  Current Outpatient Prescriptions on File Prior to Visit  Medication Sig Dispense Refill  . amiodarone (PACERONE) 200 MG tablet Take 200 mg by mouth daily.      Marland Kitchen aspirin 81 MG tablet Take 81 mg by mouth daily.        Marland Kitchen atorvastatin (LIPITOR) 20 MG tablet Take 1 tablet (20 mg total) by mouth daily.  30 tablet  11  . B Complex-C (B-COMPLEX WITH VITAMIN C) tablet Take 1 tablet by mouth daily.        . carvedilol (COREG) 6.25 MG tablet Take 1 tablet (6.25 mg total) by mouth 2 (two) times daily with a meal.  60 tablet  11  . furosemide (LASIX) 40 MG tablet Take 1 tablet (40 mg total) by mouth daily.  30 tablet  11  . glipiZIDE (GLUCOTROL XL) 10 MG 24 hr tablet Take 1 tablet (10 mg total) by mouth daily.  30 tablet  11  .  glucose blood (RELION PRIME TEST) test strip Test blood sugar 3-5 times daily, dx: 250.00  200 each  11  . insulin glargine (LANTUS) 100 UNIT/ML injection Inject 25 Units into the skin at bedtime.  10 mL  11  . insulin regular (HUMULIN R) 100 units/mL injection Inject 0.02-0.06 mLs (2-6 Units total) into the skin 3 (three) times daily before meals. Before meals---2 units for 140-180, 4 units for 181-220, 6 units if over 220  10 mL  11  . Insulin Syringe-Needle U-100 (BD INSULIN SYRINGE ULTRAFINE) 31G X 5/16" 1 ML MISC USE AS DIRECTED  100 each  11  . isosorbide mononitrate (IMDUR) 30 MG 24 hr tablet TAKE ONE (1) TABLET EACH DAY  30 tablet  3  . isosorbide mononitrate (IMDUR) 30 MG 24 hr tablet Take 0.5 tablets (15 mg total) by mouth daily.  30 tablet  3  . LORazepam (ATIVAN) 0.5 MG tablet Take 0.5-1 tablets (0.25-0.5 mg total) by mouth  at bedtime as needed (sleep).  30 tablet  1  . Multiple Vitamin (MULTIVITAMIN) tablet Take 1 tablet by mouth daily.      . Omega-3 Fatty Acids (FISH OIL) 1000 MG CAPS Take 1,000 mg by mouth daily.      . potassium chloride (K-DUR) 10 MEQ tablet Take 1 tablet (10 mEq total) by mouth 2 (two) times daily.  60 tablet  6  . quinapril (ACCUPRIL) 10 MG tablet Take 1 tablet (10 mg total) by mouth daily.  90 tablet  3  . triamcinolone (KENALOG) 0.1 % cream Apply topically 2 (two) times daily.        Marland Kitchen warfarin (COUMADIN) 5 MG tablet Take as directed by anticoagulation clinic  50 tablet  3  . DISCONTD: isosorbide mononitrate (IMDUR) 30 MG 24 hr tablet Take 15 mg by mouth daily.        Review of Systems:  Shortness of breath. No lower extremity edema. Afebrile. He also has what appears to be a subungual hematoma on 1 of his toes  Physical Examination: Filed Vitals:   02/16/12 1358  BP: 120/68  Pulse: 60  Temp: 98.4 F (36.9 C)   Filed Vitals:   02/16/12 1358  Height: 5\' 9"  (1.753 m)  Weight: 179 lb 1.9 oz (81.248 kg)   Body mass index is 26.45 kg/(m^2). Ideal Body Weight: Weight in (lb) to have BMI = 25: 168.9    GEN: WDWN, NAD, Non-toxic, A & O x 3 HEENT: Atraumatic, Normocephalic. Neck supple. No masses, No LAD. Ears and Nose: No external deformity. CV: RRR, No M/G/R. No JVD PULM: Mild crackles are present at the bilateral lower lobes. No significant wheezing. EXTR: No c/c/e NEURO Normal gait.  PSYCH: Normally interactive. Conversant. Not depressed or anxious appearing.  Calm demeanor.    Assessment and Plan: 1. Acute diastolic CHF (congestive heart failure)   2. Systolic heart failure   3. Shortness of breath     Patient seems to have some acutely worsened heart failure. His weight is up. I am going to give him some extra Lasix for the next 2 days, taking half a tablet at noon additional. He does feel improved today compared to yesterday  Wt Readings from Last 3 Encounters:    02/16/12 179 lb 1.9 oz (81.248 kg)  01/19/12 171 lb (77.565 kg)  12/17/11 178 lb (80.74 kg)     Hannah Beat, MD

## 2012-02-16 NOTE — Patient Instructions (Signed)
5 mg daily, 7.5 mg Sun,Wed, check in 1 week

## 2012-02-16 NOTE — Patient Instructions (Addendum)
Take an extra 1/2 a tablet of lasix this afternoon.  Tomorrow, take 1 tablet in the morning and take an extra 1/2 tab at noon tomorrow.  Then, keep watching your weight, and monitor weight gain and lung sounds.

## 2012-02-23 ENCOUNTER — Ambulatory Visit (INDEPENDENT_AMBULATORY_CARE_PROVIDER_SITE_OTHER): Payer: Medicare Other | Admitting: Family Medicine

## 2012-02-23 DIAGNOSIS — Z7901 Long term (current) use of anticoagulants: Secondary | ICD-10-CM

## 2012-02-23 DIAGNOSIS — Z5181 Encounter for therapeutic drug level monitoring: Secondary | ICD-10-CM

## 2012-02-23 DIAGNOSIS — I4891 Unspecified atrial fibrillation: Secondary | ICD-10-CM

## 2012-02-23 DIAGNOSIS — G459 Transient cerebral ischemic attack, unspecified: Secondary | ICD-10-CM

## 2012-02-23 NOTE — Patient Instructions (Addendum)
5 mg daily, 7.5 mg MWF, increased dose 2.5 mg weekly check in 1 week

## 2012-03-01 ENCOUNTER — Ambulatory Visit (INDEPENDENT_AMBULATORY_CARE_PROVIDER_SITE_OTHER): Payer: Medicare Other | Admitting: Internal Medicine

## 2012-03-01 DIAGNOSIS — I4891 Unspecified atrial fibrillation: Secondary | ICD-10-CM

## 2012-03-01 DIAGNOSIS — Z5181 Encounter for therapeutic drug level monitoring: Secondary | ICD-10-CM

## 2012-03-01 DIAGNOSIS — G459 Transient cerebral ischemic attack, unspecified: Secondary | ICD-10-CM

## 2012-03-01 DIAGNOSIS — Z7901 Long term (current) use of anticoagulants: Secondary | ICD-10-CM

## 2012-03-01 NOTE — Patient Instructions (Signed)
Continue current dose, check in 4 weeks  

## 2012-03-06 ENCOUNTER — Other Ambulatory Visit: Payer: Self-pay | Admitting: Cardiovascular Disease

## 2012-03-08 ENCOUNTER — Other Ambulatory Visit: Payer: Self-pay | Admitting: *Deleted

## 2012-03-08 MED ORDER — INSULIN GLARGINE 100 UNIT/ML ~~LOC~~ SOLN: 25.0000 [IU] | Freq: Every day | SUBCUTANEOUS | Status: DC

## 2012-03-08 NOTE — Telephone Encounter (Signed)
Please refill.

## 2012-03-11 ENCOUNTER — Other Ambulatory Visit: Payer: Self-pay | Admitting: *Deleted

## 2012-03-11 MED ORDER — FUROSEMIDE 40 MG PO TABS: 40.0000 mg | ORAL_TABLET | Freq: Every day | ORAL | Status: DC

## 2012-03-12 ENCOUNTER — Telehealth: Payer: Self-pay

## 2012-03-12 MED ORDER — FUROSEMIDE 40 MG PO TABS: 40.0000 mg | ORAL_TABLET | Freq: Two times a day (BID) | ORAL | Status: DC

## 2012-03-12 NOTE — Telephone Encounter (Signed)
rx sent to pharmacy by e-script  

## 2012-03-12 NOTE — Telephone Encounter (Signed)
Okay to change to bid for the Rx Send Rx #[id number] x 11

## 2012-03-12 NOTE — Telephone Encounter (Signed)
Pt request change instructions for Lasix to one twice a day as needed so insurance will pay. Pt said Dr Mariah Milling told pt if feet swelling could take Lasix twice a day; recently pt taking 1 tab twice a day.Medical [employer].Please advise.

## 2012-03-18 ENCOUNTER — Other Ambulatory Visit: Payer: Self-pay | Admitting: Internal Medicine

## 2012-03-29 ENCOUNTER — Ambulatory Visit (INDEPENDENT_AMBULATORY_CARE_PROVIDER_SITE_OTHER): Payer: Medicare Other | Admitting: Family Medicine

## 2012-03-29 ENCOUNTER — Other Ambulatory Visit: Payer: Self-pay

## 2012-03-29 ENCOUNTER — Telehealth: Payer: Self-pay | Admitting: *Deleted

## 2012-03-29 ENCOUNTER — Telehealth: Payer: Self-pay

## 2012-03-29 DIAGNOSIS — Z5181 Encounter for therapeutic drug level monitoring: Secondary | ICD-10-CM

## 2012-03-29 DIAGNOSIS — R609 Edema, unspecified: Secondary | ICD-10-CM

## 2012-03-29 DIAGNOSIS — G459 Transient cerebral ischemic attack, unspecified: Secondary | ICD-10-CM

## 2012-03-29 DIAGNOSIS — R0602 Shortness of breath: Secondary | ICD-10-CM

## 2012-03-29 DIAGNOSIS — I4891 Unspecified atrial fibrillation: Secondary | ICD-10-CM

## 2012-03-29 DIAGNOSIS — Z7901 Long term (current) use of anticoagulants: Secondary | ICD-10-CM

## 2012-03-29 DIAGNOSIS — Z0000 Encounter for general adult medical examination without abnormal findings: Secondary | ICD-10-CM

## 2012-03-29 LAB — BASIC METABOLIC PANEL
Calcium: 8.9 mg/dL (ref 8.4–10.5)
GFR: 57.45 mL/min — ABNORMAL LOW (ref >60.00)
Potassium: 4.4 mEq/L (ref 3.5–5.1)
Sodium: 140 mEq/L (ref 135–145)

## 2012-03-29 NOTE — Telephone Encounter (Signed)
Pt would like referral to endocrinologist for uncontrolled blood sugar.  He says his blood sugar dropped again and his feet are swelling.  He wants to see either Casimiro Needle Altheimer or Talmage Coin, in [place].

## 2012-03-29 NOTE — Telephone Encounter (Signed)
Pt called to schedule his 6 month f/u appt.  He is due in October. He states, "I need to be seen as soon as possible". After further questioning, pt tells me he has been having worsening sob, edema, orthopnea and "lung sounds".  He says he used to take lasix 40 mg BID but PCP switched him to qd.  He is

## 2012-03-29 NOTE — Telephone Encounter (Signed)
(  cont'd) He is unsure of why this was decreased.  BMP was normal several weeks ago. We do not have a recent BMP.  Pt is going to PCP today for INR. I advised him to have labs drawn while there (BMP and BNP). I explained we need to know kidney function to see if ok to increase lasix back to 40 mg BID.  He verb. Understanding and will have labs drawn today at PCP.  Orders put in. I offered appt with Dr. Mariah Milling when he returns from vacation next week but pt says he needs to be seen this week d/t "lung noise" and sob. I worked him in with Dr. Kirke Corin tomm afternoon and advised him to go ahead and take an extra 1/2 tablet of lasix today (20 mg).  Understanding verb.

## 2012-03-29 NOTE — Addendum Note (Signed)
Addended by: Baldomero Lamy on: 03/29/2012 01:28 PM   Modules accepted: Orders

## 2012-03-29 NOTE — Patient Instructions (Signed)
Take 5 mg  Today, then resume 5 mg daily, 7.5 mg MWF recheck 4 weeks

## 2012-03-30 ENCOUNTER — Ambulatory Visit (INDEPENDENT_AMBULATORY_CARE_PROVIDER_SITE_OTHER): Payer: Medicare Other | Admitting: Cardiovascular Disease

## 2012-03-30 ENCOUNTER — Encounter: Payer: Self-pay | Admitting: Cardiovascular Disease

## 2012-03-30 VITALS — BP 110/64 | HR 62 | Ht 69.0 in | Wt 182.0 lb

## 2012-03-30 DIAGNOSIS — I5031 Acute diastolic (congestive) heart failure: Secondary | ICD-10-CM

## 2012-03-30 DIAGNOSIS — R0602 Shortness of breath: Secondary | ICD-10-CM

## 2012-03-30 DIAGNOSIS — I509 Heart failure, unspecified: Secondary | ICD-10-CM

## 2012-03-30 DIAGNOSIS — I2510 Atherosclerotic heart disease of native coronary artery without angina pectoris: Secondary | ICD-10-CM

## 2012-03-30 DIAGNOSIS — I10 Essential (primary) hypertension: Secondary | ICD-10-CM

## 2012-03-30 DIAGNOSIS — I4891 Unspecified atrial fibrillation: Secondary | ICD-10-CM

## 2012-03-30 MED ORDER — FUROSEMIDE 40 MG PO TABS: ORAL_TABLET | ORAL | Status: DC

## 2012-03-30 NOTE — Patient Instructions (Addendum)
Change Furosemide (Lasix ) to 40 mg in the morning and 20 mg in afternoon.   Your physician has requested that you have an echocardiogram. Echocardiography is a painless test that uses sound waves to create images of your heart. It provides your doctor with information about the size and shape of your heart and how well your heart's chambers and valves are working. This procedure takes approximately one hour. There are no restrictions for this procedure.  Follow up with Dr. Mariah Milling after echo.

## 2012-03-30 NOTE — Progress Notes (Signed)
HPI  [name] is a pleasant 71 year old gentleman with past medical history of diabetes, coronary artery disease, bypass 1996, Atrial fibrillation with history of ablation, diastolic heart failure, hyperlipidemia, obstructive sleep apnea, hypertension and COPD who presents for urgent evaluation regarding increased dyspnea and lower extremity edema. The dose of Lasix was recently decreased to 40 mg once daily from 40 mg twice daily. He noticed recent increase in dyspnea, orthopnea and lower extremity edema. He reports feeling very congested. He contacted our office and was instructed to change Lasix to 40 mg in a.m. and 20 mg in the afternoon. Since then, the patient reports improved symptoms. He denies chest pain.  No Known Allergies   Current Outpatient Prescriptions on File Prior to Visit  Medication Sig Dispense Refill  . amiodarone (PACERONE) 200 MG tablet Take 200 mg by mouth daily.      Marland Kitchen aspirin 81 MG tablet Take 81 mg by mouth daily.        Marland Kitchen atorvastatin (LIPITOR) 20 MG tablet TAKE ONE (1) TABLET EACH DAY  30 tablet  11  . B Complex-C (B-COMPLEX WITH VITAMIN C) tablet Take 1 tablet by mouth daily.        . carvedilol (COREG) 6.25 MG tablet Take 1 tablet (6.25 mg total) by mouth 2 (two) times daily with a meal.  60 tablet  11  . glipiZIDE (GLUCOTROL XL) 10 MG 24 hr tablet Take 1 tablet (10 mg total) by mouth daily.  30 tablet  11  . glucose blood (RELION PRIME TEST) test strip Test blood sugar 3-5 times daily, dx: 250.00  200 each  11  . insulin glargine (LANTUS) 100 UNIT/ML injection Inject 25 Units into the skin at bedtime.  10 mL  11  . insulin regular (HUMULIN R) 100 units/mL injection Inject 0.02-0.06 mLs (2-6 Units total) into the skin 3 (three) times daily before meals. Before meals---2 units for 140-180, 4 units for 181-220, 6 units if over 220  10 mL  11  . Insulin Syringe-Needle U-100 (BD INSULIN SYRINGE ULTRAFINE) 31G X 5/16" 1 ML MISC USE AS DIRECTED  100 each  11   . isosorbide mononitrate (IMDUR) 30 MG 24 hr tablet TAKE ONE (1) TABLET EACH DAY  30 tablet  3  . LORazepam (ATIVAN) 0.5 MG tablet Take 0.5-1 tablets (0.25-0.5 mg total) by mouth at bedtime as needed (sleep).  30 tablet  1  . Multiple Vitamin (MULTIVITAMIN) tablet Take 1 tablet by mouth daily.      . Omega-3 Fatty Acids (FISH OIL) 1000 MG CAPS Take 1,000 mg by mouth daily.      . potassium chloride (K-DUR) 10 MEQ tablet Take 1 tablet (10 mEq total) by mouth 2 (two) times daily.  60 tablet  6  . quinapril (ACCUPRIL) 10 MG tablet Take 1 tablet (10 mg total) by mouth daily.  90 tablet  3  . triamcinolone (KENALOG) 0.1 % cream Apply topically 2 (two) times daily.        Marland Kitchen warfarin (COUMADIN) 5 MG tablet TAKE AS DIRECTED BY ANTICOAGULATION     CLINIC  50 tablet  3  . DISCONTD: furosemide (LASIX) 40 MG tablet Take 1 tablet (40 mg total) by mouth 2 (two) times daily.  60 tablet  11  . DISCONTD: isosorbide mononitrate (IMDUR) 30 MG 24 hr tablet Take 0.5 tablets (15 mg total) by mouth daily.  30 tablet  3     Past Medical History  Diagnosis Date  .  Atrial fibrillation      S/P ablation  . CAD (coronary artery disease)     S/P MI 1980's----------------------Dr McDowell  . Hyperlipidemia   . CHF (congestive heart failure)   . 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 BPH (benign prostatic hypertrophy)   . Stroke   . Sleep disturbance      Past Surgical History  Procedure Date  . Coronary artery bypass graft   . Appendectomy   . Tonsillectomy   . Upper gastrointestinal endoscopy     Gastritis , ? H. pylori 08/28/1999  . Atrial ablation surgery      History reviewed. No pertinent family history.   History   Social History  . Marital Status: Single    Spouse Name: N/A    Number of Children: N/A  . Years of Education: N/A   Occupational History  . disabled due to heart disease    Social History Main Topics  . Smoking status: Former Smoker -- 1.0  packs/day    Types: Cigarettes    Quit date: 07/15/1973  . Smokeless tobacco: Never Used  . Alcohol Use: No  . Drug Use: No  . Sexually Active: Not on file   Other Topics Concern  . Not on file   Social History Narrative   Single--lives aloneStays with sister with Altzheimer's frequentlyHas living willBrother, Gerlene Burdock has health care POAWould accept resuscitation but no prolonged artificial ventilationNo feeding tube if cognitively unaware     PHYSICAL EXAM   BP 110/64  Pulse 62  Ht 5\' 9"  (1.753 m)  Wt 182 lb (82.555 kg)  BMI 26.88 kg/m2 Constitutional: He is oriented to person, place, and time. He appears well-developed and well-nourished. No distress.  HENT: No nasal discharge.  Head: Normocephalic and atraumatic.  Eyes: Pupils are equal and round. Right eye exhibits no discharge. Left eye exhibits no discharge.  Neck: Normal range of motion. Neck supple. Mild JVD present. No thyromegaly present.  Cardiovascular: Normal rate, regular rhythm, normal heart sounds and. Exam reveals no gallop and no friction rub. No murmur heard.  Pulmonary/Chest: Effort normal and breath sounds normal. No stridor. No respiratory distress. He has no wheezes. He has few bibasilar rales. He exhibits no tenderness.  Abdominal: Soft. Bowel sounds are normal. He exhibits no distension. There is no tenderness. There is no rebound and no guarding.  Musculoskeletal: Normal range of motion. He exhibits +1 edema and no tenderness.  Neurological: He is alert and oriented to person, place, and time. Coordination normal.  Skin: Skin is warm and dry. No rash noted. He is not diaphoretic. No erythema. No pallor.  Psychiatric: He has a normal mood and affect. His behavior is normal. Judgment and thought content normal.       [id number] rhythm Left bundle branch block  ABNORMAL    ASSESSMENT AND PLAN

## 2012-03-30 NOTE — Assessment & Plan Note (Signed)
No symptoms of chest pain. He might need ischemic evaluation based on the results of echocardiogram.

## 2012-03-30 NOTE — Assessment & Plan Note (Signed)
The patient had significant worsening and fluid overload recently. He still appears slightly volume overloaded today. He has no symptoms of chest pain. He had labs checked yesterday but the results are still pending. It's not clear why he decompensated. I recommend continuing Lasix 40 mg in the morning and 20 in the afternoon. It seems that he improved with this change. I will request an echocardiogram to see if there is any change from before. I will have him followup with Dr. Mariah Milling after the echocardiogram.

## 2012-03-30 NOTE — Assessment & Plan Note (Addendum)
He is maintaining in sinus rhythm on amiodarone. He might need pulmonary function testing, thyroid and liver function tests if not recently done.

## 2012-03-30 NOTE — Telephone Encounter (Signed)
Referral faxed to [facility] patient aware.

## 2012-03-31 ENCOUNTER — Other Ambulatory Visit: Payer: Medicare Other

## 2012-03-31 LAB — BRAIN NATRIURETIC PEPTIDE: Pro B Natriuretic peptide (BNP): 322 pg/mL — ABNORMAL HIGH (ref 0.0–100.0)

## 2012-04-07 ENCOUNTER — Ambulatory Visit: Payer: Medicare Other | Admitting: Internal Medicine

## 2012-04-09 ENCOUNTER — Ambulatory Visit (INDEPENDENT_AMBULATORY_CARE_PROVIDER_SITE_OTHER): Payer: Medicare Other | Admitting: Internal Medicine

## 2012-04-09 ENCOUNTER — Encounter: Payer: Self-pay | Admitting: Internal Medicine

## 2012-04-09 VITALS — BP 110/60 | HR 63 | Temp 98.3°F | Ht 69.0 in | Wt 177.0 lb

## 2012-04-09 DIAGNOSIS — E161 Other hypoglycemia: Secondary | ICD-10-CM

## 2012-04-09 DIAGNOSIS — S8010XA Contusion of unspecified lower leg, initial encounter: Secondary | ICD-10-CM

## 2012-04-09 DIAGNOSIS — E162 Hypoglycemia, unspecified: Secondary | ICD-10-CM

## 2012-04-09 DIAGNOSIS — S8011XA Contusion of right lower leg, initial encounter: Secondary | ICD-10-CM | POA: Insufficient documentation

## 2012-04-09 NOTE — Patient Instructions (Signed)
Please stop all the regular insulin. Cut back on the long acting insulin (lantus) to 20 units daily

## 2012-04-09 NOTE — Assessment & Plan Note (Signed)
Dangerous reaction Will stop all regular insulin and decrease the lantus to 20 units daily

## 2012-04-09 NOTE — Progress Notes (Signed)
Subjective:    Patient ID: [name], male    DOB: 1940/08/26, 71 y.o.   MRN: [id number]  HPI Sugar dropped on him 2 weeks ago--thinks his insulin was too high Sister called EMS and he was combative Has to call police to hold him down Policemen had to sit on his legs to control him Noted pain in right leg and then policeman "ground his knee into my leg" Severe immediate pain Very hard to walk on it Didn't go to ER-afraid of the expense  Worsened for several days then started improving Some of pain is persisting--soreness and at times, sharp pains occ so bad it "would make me sick"  Has been hobbling but can walk Using cane for support Leg will "jump" at times also  Current Outpatient Prescriptions on File Prior to Visit  Medication Sig Dispense Refill  . amiodarone (PACERONE) 200 MG tablet Take 200 mg by mouth daily.      Marland Kitchen aspirin 81 MG tablet Take 81 mg by mouth daily.        Marland Kitchen atorvastatin (LIPITOR) 20 MG tablet TAKE ONE (1) TABLET EACH DAY  30 tablet  11  . B Complex-C (B-COMPLEX WITH VITAMIN C) tablet Take 1 tablet by mouth daily.        . carvedilol (COREG) 6.25 MG tablet Take 1 tablet (6.25 mg total) by mouth 2 (two) times daily with a meal.  60 tablet  11  . furosemide (LASIX) 40 MG tablet Take 1 tablet in morning and 1/2 tablet in afternoon.  60 tablet  6  . glipiZIDE (GLUCOTROL XL) 10 MG 24 hr tablet Take 1 tablet (10 mg total) by mouth daily.  30 tablet  11  . glucose blood (RELION PRIME TEST) test strip Test blood sugar 3-5 times daily, dx: 250.00  200 each  11  . insulin glargine (LANTUS) 100 UNIT/ML injection Inject 25 Units into the skin at bedtime.  10 mL  11  . insulin regular (HUMULIN R) 100 units/mL injection Inject 0.02-0.06 mLs (2-6 Units total) into the skin 3 (three) times daily before meals. Before meals---2 units for 140-180, 4 units for 181-220, 6 units if over 220  10 mL  11  . Insulin Syringe-Needle U-100 (BD INSULIN SYRINGE ULTRAFINE) 31G X 5/16" 1  ML MISC USE AS DIRECTED  100 each  11  . isosorbide mononitrate (IMDUR) 30 MG 24 hr tablet TAKE ONE (1) TABLET EACH DAY  30 tablet  3  . LORazepam (ATIVAN) 0.5 MG tablet Take 0.5-1 tablets (0.25-0.5 mg total) by mouth at bedtime as needed (sleep).  30 tablet  1  . Multiple Vitamin (MULTIVITAMIN) tablet Take 1 tablet by mouth daily.      . Omega-3 Fatty Acids (FISH OIL) 1000 MG CAPS Take 1,000 mg by mouth daily.      . potassium chloride (K-DUR) 10 MEQ tablet Take 1 tablet (10 mEq total) by mouth 2 (two) times daily.  60 tablet  6  . quinapril (ACCUPRIL) 10 MG tablet Take 1 tablet (10 mg total) by mouth daily.  90 tablet  3  . triamcinolone (KENALOG) 0.1 % cream Apply topically 2 (two) times daily.        Marland Kitchen warfarin (COUMADIN) 5 MG tablet TAKE AS DIRECTED BY ANTICOAGULATION     CLINIC  50 tablet  3    No Known Allergies  Past Medical History  Diagnosis Date  . Atrial fibrillation      S/P ablation  . CAD (  coronary artery disease)     S/P MI 1980's----------------------Dr McDowell  . Hyperlipidemia   . CHF (congestive heart failure)   . 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 BPH (benign prostatic hypertrophy)   . Stroke   . Sleep disturbance     Past Surgical History  Procedure Date  . Coronary artery bypass graft   . Appendectomy   . Tonsillectomy   . Upper gastrointestinal endoscopy     Gastritis , ? H. pylori 08/28/1999  . Atrial ablation surgery     No family history on file.  History   Social History  . Marital Status: Single    Spouse Name: N/A    Number of Children: N/A  . Years of Education: N/A   Occupational History  . disabled due to heart disease    Social History Main Topics  . Smoking status: Former Smoker -- 1.0 packs/day    Types: Cigarettes    Quit date: 07/15/1973  . Smokeless tobacco: Never Used  . Alcohol Use: No  . Drug Use: No  . Sexually Active: Not on file   Other Topics Concern  . Not on file    Social History Narrative   Single--lives aloneStays with sister with Altzheimer's frequentlyHas living willBrother, Gerlene Burdock has health care POAWould accept resuscitation but no prolonged artificial ventilationNo feeding tube if cognitively unaware   Review of Systems Was bruised No migration of the bruise No sig calf swelling    Objective:   Physical Exam  Constitutional: He appears well-developed and well-nourished.  Musculoskeletal:       Extensive yellowed bruise along medial right thigh from knee and up about 1/3rd Exquisitely tender No sig warmth No calf tenderness or sig swelling  Neurological:       Full weight bearing but still antalgic gait          Assessment & Plan:

## 2012-04-09 NOTE — Assessment & Plan Note (Signed)
Fairly severe but should resolve on its own Discussed that this could take a month or more still

## 2012-04-13 ENCOUNTER — Other Ambulatory Visit (INDEPENDENT_AMBULATORY_CARE_PROVIDER_SITE_OTHER): Payer: Medicare Other

## 2012-04-13 ENCOUNTER — Other Ambulatory Visit: Payer: Self-pay

## 2012-04-13 DIAGNOSIS — R0602 Shortness of breath: Secondary | ICD-10-CM

## 2012-04-13 DIAGNOSIS — R609 Edema, unspecified: Secondary | ICD-10-CM

## 2012-04-16 ENCOUNTER — Encounter: Payer: Self-pay | Admitting: Internal Medicine

## 2012-04-16 ENCOUNTER — Telehealth: Payer: Self-pay | Admitting: Internal Medicine

## 2012-04-16 ENCOUNTER — Telehealth: Payer: Self-pay | Admitting: Cardiovascular Disease

## 2012-04-16 NOTE — Telephone Encounter (Signed)
Please call It would be fine to take the lantus in the morning if he prefers.  Sometimes that is easier Can change the directions on his med list if he wishes to do this

## 2012-04-16 NOTE — Telephone Encounter (Signed)
Patient advised.  Med list adjusted.

## 2012-04-16 NOTE — Telephone Encounter (Signed)
Caller: [name]/Patient; Patient Name: [name], [name] Noa; PCP: Tillman Abide [facility]); Best Callback Phone Number: [phone];  Call regarding taking Lantus twice on 9-12. Patient took Lantus 20 units before going to bed, he normally takes Lantus 20 units in the AM.  FSBS 95 at 815 am after breakfast and candy.  Patient asymptomatic.  Per Patient, Diabetic specialist at hospital advised me to take Lantus in the am instead of pm due to my sugar dropping approximately 1 year ago.  During triage, explained Lantus was written by MD to take before bedtime, Patient remembered MD mentioning taking Lantus at bedtime. Patient's FSBS dropped to 55 x3 during the night on 9-12.  All emergent symptoms ruled out per Diabetes Control Problems protocol, call provider in 24 hours due to two or more hypoglycemic episodes.  Patient needs clarification when to take Lantus 20 units, AM or PM.  PLEASE CALL PATIENT BACK.

## 2012-04-16 NOTE — Telephone Encounter (Signed)
Pt states he accidentally took his morning pill for this morning last night instead.  Pt would like to know if he should take the pill again this morning.  Please call to advise.

## 2012-04-16 NOTE — Telephone Encounter (Signed)
Pt says he accidentally took his amiodarone and KCL last PM instead of this morning. These are once a day meds. He asks if he should take them again this am as he usually dose. I advised against this, to juts resume as he usually takes tomm am.  Understanding verb.  He has also called PCP b/c he took glipizide and insulin last PM instead of this am. He has been monitoring his blood sugar q2 hours throughout night and this am. He says it did drop to 55, but has now come up slightly. He is asymptomatic and has been eating. He will await their call to give him more instruction.  I did advise him to not take glipizide again this am but to wait on instruction from PCP re: insulin. Understanding verb.

## 2012-04-22 ENCOUNTER — Ambulatory Visit (INDEPENDENT_AMBULATORY_CARE_PROVIDER_SITE_OTHER): Payer: Medicare Other | Admitting: Cardiovascular Disease

## 2012-04-22 ENCOUNTER — Ambulatory Visit (INDEPENDENT_AMBULATORY_CARE_PROVIDER_SITE_OTHER): Payer: Medicare Other | Admitting: Internal Medicine

## 2012-04-22 ENCOUNTER — Encounter: Payer: Self-pay | Admitting: Internal Medicine

## 2012-04-22 ENCOUNTER — Telehealth: Payer: Self-pay | Admitting: Internal Medicine

## 2012-04-22 ENCOUNTER — Encounter: Payer: Self-pay | Admitting: Cardiovascular Disease

## 2012-04-22 VITALS — BP 110/60 | HR 61 | Temp 98.3°F | Wt 179.0 lb

## 2012-04-22 VITALS — BP 110/58 | HR 59 | Ht 69.0 in | Wt 180.5 lb

## 2012-04-22 DIAGNOSIS — I4891 Unspecified atrial fibrillation: Secondary | ICD-10-CM

## 2012-04-22 DIAGNOSIS — I5022 Chronic systolic (congestive) heart failure: Secondary | ICD-10-CM

## 2012-04-22 DIAGNOSIS — I10 Essential (primary) hypertension: Secondary | ICD-10-CM

## 2012-04-22 DIAGNOSIS — E162 Hypoglycemia, unspecified: Secondary | ICD-10-CM | POA: Insufficient documentation

## 2012-04-22 DIAGNOSIS — S8010XA Contusion of unspecified lower leg, initial encounter: Secondary | ICD-10-CM | POA: Insufficient documentation

## 2012-04-22 DIAGNOSIS — R609 Edema, unspecified: Secondary | ICD-10-CM

## 2012-04-22 DIAGNOSIS — I509 Heart failure, unspecified: Secondary | ICD-10-CM

## 2012-04-22 DIAGNOSIS — E785 Hyperlipidemia, unspecified: Secondary | ICD-10-CM

## 2012-04-22 MED ORDER — GLIPIZIDE 5 MG PO TABS: 5.0000 mg | ORAL_TABLET | Freq: Every day | ORAL | Status: DC

## 2012-04-22 MED ORDER — TORSEMIDE 20 MG PO TABS: 40.0000 mg | ORAL_TABLET | Freq: Two times a day (BID) | ORAL | Status: DC

## 2012-04-22 NOTE — Patient Instructions (Signed)
Please stop the 10mg  glipizide Start the 5mg  (new prescription). If you continue to have low sugar reactions after a week, stop this med altogether

## 2012-04-22 NOTE — Progress Notes (Signed)
Patient ID: [name], male    DOB: January 11, 1941, 71 y.o.   MRN: [id number]  HPI Comments: [name] is a pleasant 71 year old gentleman with past medical history of diabetes, coronary artery disease, bypass 1996, Atrial fibrillation with history of ablation, hyperlipidemia, obstructive sleep apnea, hypertension and COPD who presents for routine followup. 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 Inferior wall defect was seen on stress test in 2011.  Hx of nightmares which wake him up.    in early 2011 he was in normal sinus rhythm. On a visit to Dr. Alphonsus Sias in 2012 he was noted to be in atrial fibrillation. He has increased shortness of breath. He has had some chronic SOB with previous workup in 2011 echocardiogram and stress test . He has been on anticoagulation ,   cardioversion done 3/18 that was successful.  He has maintained sinus rhythm since that time   Following the cardioversion, he had significant weight gain, lower extremity edema. He was started on Lasix twice a day with potassium. Since then he has had a 20 pound weight loss with improvement of his symptoms. He is now only taking Lasix daily. Shortness of breath has resolved. Overall he feels well  [facility] [place] [facility]  and suggested to increase Lasix for lower extremity edema . His weight has increased to 180 pounds up from 170 pounds and he is more short of breath today .   He reports that recently his sugar dropped low. He had mental status changes and the police were called I seemed to his house. The police and pinned him down and put their knee into the inner aspect of his right leg into his calf.  Subsequent to this, he had severe leg pain on the right with swelling. It is now 3 weeks later and the swelling has decreased to a mild extent, pain is still severe but improved slightly.    EKG shows normal sinus rhythm with rate 59 beats per minute, intraventricular  conduction delay    Outpatient Encounter Prescriptions as of 04/22/2012  Medication Sig Dispense Refill  . amiodarone (PACERONE) 200 MG tablet Take 200 mg by mouth daily.      Marland Kitchen aspirin 81 MG tablet Take 81 mg by mouth daily.        Marland Kitchen atorvastatin (LIPITOR) 20 MG tablet TAKE ONE (1) TABLET EACH DAY  30 tablet  11  . B Complex-C (B-COMPLEX WITH VITAMIN C) tablet Take 1 tablet by mouth daily.        . carvedilol (COREG) 6.25 MG tablet Take 1 tablet (6.25 mg total) by mouth 2 (two) times daily with a meal.  60 tablet  11  . furosemide (LASIX) 40 MG tablet Take 1 tablet in morning and 1/2 tablet in afternoon.  60 tablet  6  . glipiZIDE (GLUCOTROL XL) 10 MG 24 hr tablet Take 1 tablet (10 mg total) by mouth daily.  30 tablet  11  . glucose blood (RELION PRIME TEST) test strip Test blood sugar 3-5 times daily, dx: 250.00  200 each  11  . insulin glargine (LANTUS) 100 UNIT/ML injection Inject 20 Units into the skin every morning.       . Insulin Syringe-Needle U-100 (BD INSULIN SYRINGE ULTRAFINE) 31G X 5/16" 1 ML MISC USE AS DIRECTED  100 each  11  . isosorbide mononitrate (IMDUR) 30 MG 24 hr tablet TAKE ONE (1) TABLET EACH DAY  30 tablet  3  . LORazepam (ATIVAN) 0.5 MG tablet Take 0.5-1 tablets (0.25-0.5 mg total) by mouth at bedtime as needed (sleep).  30 tablet  1  . Multiple Vitamin (MULTIVITAMIN) tablet Take 1 tablet by mouth daily.      . Omega-3 Fatty Acids (FISH OIL) 1000 MG CAPS Take 1,000 mg by mouth daily.      . potassium chloride (K-DUR) 10 MEQ tablet Take 1 tablet (10 mEq total) by mouth 2 (two) times daily.  60 tablet  6  . quinapril (ACCUPRIL) 10 MG tablet Take 1 tablet (10 mg total) by mouth daily.  90 tablet  3  . triamcinolone (KENALOG) 0.1 % cream Apply topically 2 (two) times daily.        Marland Kitchen warfarin (COUMADIN) 5 MG tablet TAKE AS DIRECTED BY ANTICOAGULATION     CLINIC  50 tablet  3    Review of Systems  Constitutional: Negative.   HENT: Negative.   Eyes: Negative.     Respiratory: Positive for shortness of breath.   Cardiovascular: Positive for leg swelling.  Musculoskeletal: Positive for myalgias.       Leg swelling on the right with bruising  Skin: Negative.   Neurological: Negative.   Hematological: Negative.   Psychiatric/Behavioral: Negative.   All other systems reviewed and are negative.    BP 110/58  Pulse 59  Ht 5\' 9"  (1.753 m)  Wt 180 lb 8 oz (81.874 kg)  BMI 26.66 kg/m2  Physical Exam  Nursing note and vitals reviewed. Constitutional: He is oriented to person, place, and time. He appears well-developed and well-nourished.  HENT:  Head: Normocephalic.  Nose: Nose normal.  Mouth/Throat: Oropharynx is clear and moist.  Eyes: Conjunctivae normal are normal. Pupils are equal, round, and reactive to light.  Neck: Normal range of motion. Neck supple. No JVD present.  Cardiovascular: Normal rate, regular rhythm, S1 normal, S2 normal and intact distal pulses.  Exam reveals no gallop and no friction rub.   Murmur heard.  Crescendo systolic murmur is present with a grade of 2/6       Region of swelling in the right thigh that is firm consistent with hematoma. Bruising and hematoma extending into the posterior calf on the right  Pulmonary/Chest: Effort normal and breath sounds normal. No respiratory distress. He has no wheezes. He has no rales. He exhibits no tenderness.  Abdominal: Soft. Bowel sounds are normal. He exhibits no distension. There is no tenderness.  Musculoskeletal: Normal range of motion. He exhibits no edema and no tenderness.  Lymphadenopathy:    He has no cervical adenopathy.  Neurological: He is alert and oriented to person, place, and time. Coordination normal.  Skin: Skin is warm and dry. No rash noted. No erythema.  Psychiatric: He has a normal mood and affect. His behavior is normal. Judgment and thought content normal.           Assessment and Plan

## 2012-04-22 NOTE — Assessment & Plan Note (Signed)
Edema is predominantly on the right, likely exacerbated by recent trauma to the right leg. Diuretic changes as above.

## 2012-04-22 NOTE — Telephone Encounter (Signed)
Caller: Daschel/Patient; Patient Name: [name], [name]; PCP: [facility]); Best Callback Phone Number: [phone]. Call regarding blood sugar levels. Woke up this morning and blood sugar was 54. Patient ate 4 pieces of candy and also had an egg, cheese, and tomato sandwich. Blood sugar after eating was 194. Patient wants to know if he is supposed to take his medicine now. He also takes 20 units of Lantus insulin. Patient wants to be sure that he is supposed to take this now. Reports he has been waking up during the middle of the night and having to check blood sugar levels and eat a snack so he will not wake up in the morning with a very low reading. Emergent symptoms ruled out per Diabetes: Control Problems guideline. with exception to "All other situations." Disposition: See provider within 24 hours. Appointment scheduled 04/22/12 with Dr. Alphonsus Sias at 4:00pm. Instructed patient to call back before appointment if he has any questions or any symptoms worsen. Care advice given per guideline.

## 2012-04-22 NOTE — Assessment & Plan Note (Signed)
We have suggested he stay on his Lipitor 

## 2012-04-22 NOTE — Progress Notes (Signed)
Subjective:    Patient ID: [name], male    DOB: 02-05-1941, 71 y.o.   MRN: [id number]  HPI Had another hypoglycemic spell Dropped to 54 in AM Eyesight is "messed up". No nausea or dizziness Took food and sugar went high (see phone note)  Occ can has also had reactions at dinnertime Did stop the short acting insulin Decreased lantus to 20 units a day  Checking 5-6 times per day because of the problems (he is paying or the strips himself) During the day, okay. Low in AM and occ in afternoon (into 60's at times) Occ gets high sugars even without eating much  Current Outpatient Prescriptions on File Prior to Visit  Medication Sig Dispense Refill  . amiodarone (PACERONE) 200 MG tablet Take 200 mg by mouth daily.      Marland Kitchen aspirin 81 MG tablet Take 81 mg by mouth daily.        Marland Kitchen atorvastatin (LIPITOR) 20 MG tablet TAKE ONE (1) TABLET EACH DAY  30 tablet  11  . B Complex-C (B-COMPLEX WITH VITAMIN C) tablet Take 1 tablet by mouth daily.        . carvedilol (COREG) 6.25 MG tablet Take 1 tablet (6.25 mg total) by mouth 2 (two) times daily with a meal.  60 tablet  11  . glipiZIDE (GLUCOTROL XL) 10 MG 24 hr tablet Take 1 tablet (10 mg total) by mouth daily.  30 tablet  11  . glucose blood (RELION PRIME TEST) test strip Test blood sugar 3-5 times daily, dx: 250.00  200 each  11  . insulin glargine (LANTUS) 100 UNIT/ML injection Inject 20 Units into the skin every morning.       . Insulin Syringe-Needle U-100 (BD INSULIN SYRINGE ULTRAFINE) 31G X 5/16" 1 ML MISC USE AS DIRECTED  100 each  11  . isosorbide mononitrate (IMDUR) 30 MG 24 hr tablet TAKE ONE (1) TABLET EACH DAY  30 tablet  3  . LORazepam (ATIVAN) 0.5 MG tablet Take 0.5-1 tablets (0.25-0.5 mg total) by mouth at bedtime as needed (sleep).  30 tablet  1  . Multiple Vitamin (MULTIVITAMIN) tablet Take 1 tablet by mouth daily.      . Omega-3 Fatty Acids (FISH OIL) 1000 MG CAPS Take 1,000 mg by mouth daily.      . potassium chloride  (K-DUR) 10 MEQ tablet Take 1 tablet (10 mEq total) by mouth 2 (two) times daily.  60 tablet  6  . quinapril (ACCUPRIL) 10 MG tablet Take 1 tablet (10 mg total) by mouth daily.  90 tablet  3  . torsemide (DEMADEX) 20 MG tablet Take 2 tablets (40 mg total) by mouth 2 (two) times daily.  120 tablet  6  . triamcinolone (KENALOG) 0.1 % cream Apply topically 2 (two) times daily.          No Known Allergies  Past Medical History  Diagnosis Date  . Atrial fibrillation      S/P ablation  . CAD (coronary artery disease)     S/P MI 1980's----------------------Dr McDowell  . Hyperlipidemia   . CHF (congestive heart failure)   . 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 BPH (benign prostatic hypertrophy)   . Stroke   . Sleep disturbance     Past Surgical History  Procedure Date  . Coronary artery bypass graft   . Appendectomy   . Tonsillectomy   . Upper gastrointestinal endoscopy     Gastritis , ?  H. pylori 08/28/1999  . Atrial ablation surgery     No family history on file.  History   Social History  . Marital Status: Single    Spouse Name: N/A    Number of Children: N/A  . Years of Education: N/A   Occupational History  . disabled due to heart disease    Social History Main Topics  . Smoking status: Former Smoker -- 1.0 packs/day    Types: Cigarettes    Quit date: 07/15/1973  . Smokeless tobacco: Never Used  . Alcohol Use: No  . Drug Use: No  . Sexually Active: Not on file   Other Topics Concern  . Not on file   Social History Narrative   Single--lives aloneStays with sister with Altzheimer's frequentlyHas living willBrother, Gerlene Burdock has health care POAWould accept resuscitation but no prolonged artificial ventilationNo feeding tube if cognitively unaware   Review of Systems Dr Mariah Milling just changed his diuretics LV dysfunction noted on recent echo and has been having some increased edema    Objective:   Physical Exam  Psychiatric: He  has a normal mood and affect. His behavior is normal.          Assessment & Plan:

## 2012-04-22 NOTE — Assessment & Plan Note (Signed)
Ejection fraction is depressed on recent echocardiogram, moderate to severe pulmonary hypertension. He has worsening edema, 10 pound weight gain, worsening shortness of breath. We have suggested he stop Lasix and start torsemide 40 mg twice a day with close monitoring of his weight. Goal weight in the low 170 range.

## 2012-04-22 NOTE — Assessment & Plan Note (Signed)
Large hematoma in the right thigh, some bruising and hematoma in the right calf. He does report it is slowly improving though significantly painful. He does not look anemic and we will not check a CBC today. We have suggested light range of motion, warm compress.

## 2012-04-22 NOTE — Telephone Encounter (Signed)
May need to cut back on his regimen Will discuss at visit

## 2012-04-22 NOTE — Assessment & Plan Note (Signed)
Ongoing low sugars and some reactions despite cut in lantus and stopping short acting insulin Clearly must be related to the glipizide Will decrease this to 5 and not XL If persistent hypoglycemia, will stop this altogether

## 2012-04-22 NOTE — Assessment & Plan Note (Signed)
Blood pressure is well controlled on today's visit. No changes made to the medications. 

## 2012-04-22 NOTE — Patient Instructions (Addendum)
Please stop lasix Start torsemide 40 mg in the am and PM (two pills at 8 Am and two pills at 2 PM)  Please call us if you have new issues that need to be addressed before your next appt.  Your physician wants you to follow-up in: 1 months.

## 2012-04-26 ENCOUNTER — Ambulatory Visit (INDEPENDENT_AMBULATORY_CARE_PROVIDER_SITE_OTHER): Payer: Medicare Other | Admitting: Internal Medicine

## 2012-04-26 DIAGNOSIS — I4891 Unspecified atrial fibrillation: Secondary | ICD-10-CM

## 2012-04-26 DIAGNOSIS — Z7901 Long term (current) use of anticoagulants: Secondary | ICD-10-CM

## 2012-04-26 DIAGNOSIS — Z5181 Encounter for therapeutic drug level monitoring: Secondary | ICD-10-CM

## 2012-04-26 NOTE — Patient Instructions (Addendum)
5 mg daily, 7.5 mg M/F recheck 2 weeks

## 2012-05-07 ENCOUNTER — Ambulatory Visit: Payer: Medicare Other | Admitting: Internal Medicine

## 2012-05-10 ENCOUNTER — Other Ambulatory Visit: Payer: Self-pay | Admitting: *Deleted

## 2012-05-10 ENCOUNTER — Ambulatory Visit: Payer: Medicare Other

## 2012-05-10 MED ORDER — AMIODARONE HCL 200 MG PO TABS: 200.0000 mg | ORAL_TABLET | Freq: Every day | ORAL | Status: DC

## 2012-05-10 NOTE — Telephone Encounter (Signed)
Refilled Amiodarone. Sent to [facility] pharmacy in [place].

## 2012-05-13 ENCOUNTER — Ambulatory Visit (INDEPENDENT_AMBULATORY_CARE_PROVIDER_SITE_OTHER): Payer: Medicare Other | Admitting: Internal Medicine

## 2012-05-13 DIAGNOSIS — Z5181 Encounter for therapeutic drug level monitoring: Secondary | ICD-10-CM

## 2012-05-13 DIAGNOSIS — Z7901 Long term (current) use of anticoagulants: Secondary | ICD-10-CM

## 2012-05-13 DIAGNOSIS — I4891 Unspecified atrial fibrillation: Secondary | ICD-10-CM

## 2012-05-13 NOTE — Patient Instructions (Signed)
Continue current dose, check in 4 weeks  

## 2012-05-14 ENCOUNTER — Telehealth: Payer: Self-pay

## 2012-05-14 ENCOUNTER — Other Ambulatory Visit: Payer: Self-pay | Admitting: Cardiovascular Disease

## 2012-05-14 MED ORDER — CARVEDILOL 6.25 MG PO TABS: 6.2500 mg | ORAL_TABLET | Freq: Two times a day (BID) | ORAL | Status: DC

## 2012-05-14 NOTE — Telephone Encounter (Signed)
Refilled Carvedilol. 

## 2012-05-14 NOTE — Telephone Encounter (Signed)
Pt calling about a RX refill we sent to pharmacy. He says it is for carvedilol but pharmacy tells him insurance wont pay b/c he is filling it "too soon".  He was able to read off medicine bottles to me and realized he DOES have his carvedilol and all other cardiac meds.  He is now unsure as to which med pharmacy is talking about and will call to get clarification. He now thinks it is one of his DM meds.

## 2012-05-24 ENCOUNTER — Encounter: Payer: Self-pay | Admitting: Cardiovascular Disease

## 2012-05-24 ENCOUNTER — Ambulatory Visit (INDEPENDENT_AMBULATORY_CARE_PROVIDER_SITE_OTHER): Payer: Medicare Other | Admitting: Cardiovascular Disease

## 2012-05-24 VITALS — BP 98/58 | HR 60 | Ht 69.0 in | Wt 167.5 lb

## 2012-05-24 DIAGNOSIS — I10 Essential (primary) hypertension: Secondary | ICD-10-CM

## 2012-05-24 DIAGNOSIS — G459 Transient cerebral ischemic attack, unspecified: Secondary | ICD-10-CM

## 2012-05-24 DIAGNOSIS — I5022 Chronic systolic (congestive) heart failure: Secondary | ICD-10-CM

## 2012-05-24 DIAGNOSIS — I509 Heart failure, unspecified: Secondary | ICD-10-CM

## 2012-05-24 DIAGNOSIS — I4891 Unspecified atrial fibrillation: Secondary | ICD-10-CM

## 2012-05-24 NOTE — Progress Notes (Signed)
Patient ID: [name], male    DOB: 02-18-1941, 71 y.o.   MRN: [id number]  HPI Comments: [name] is a pleasant 71 year old gentleman with past medical history of diabetes, coronary artery disease, bypass 1996, Atrial fibrillation with history of ablation, hyperlipidemia, obstructive sleep apnea, hypertension and COPD who presents for routine followup. 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 Inferior wall defect was seen on stress test in 2011.  Hx of nightmares which wake him up.   Episodes of paroxysmal atrial fibrillation.   He had significant weight loss since his last clinic visit one month ago. He is down 13 pounds on torsemide. We have suggested he closely follow his weight with goal weight 170 pounds. Today he is 167 pounds down from 180 pounds. He was taking torsemide 40 mg twice a day, now takes torsemide 20 mg daily. He feels much better, lower extremity edema has resolved.   He reports that his Sugars have been running up, and at night it is low. Sees  endocrine in [place].  Chronic right thigh hematoma, much less tender now.  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 amiodarone (PACERONE) 200 MG tablet Take 1 tablet (200 mg total) by mouth daily.  60 tablet  1  . aspirin 81 MG tablet Take 81 mg by mouth daily.        Marland Kitchen atorvastatin (LIPITOR) 20 MG tablet TAKE ONE (1) TABLET EACH DAY  30 tablet  11  . B Complex-C (B-COMPLEX WITH VITAMIN C) tablet Take 1 tablet by mouth daily.        . carvedilol (COREG) 6.25 MG tablet Take 1 tablet (6.25 mg total) by mouth 2 (two) times daily with a meal.  60 tablet  5  . cholecalciferol (VITAMIN D) 1000 UNITS tablet Take 1,000 Units by mouth daily.      Marland Kitchen glucose blood (RELION PRIME TEST) test strip Test  blood sugar 3-5 times daily, dx: 250.00  200 each  11  . insulin glargine (LANTUS) 100 UNIT/ML injection Inject 20 Units into the skin every morning.       . Insulin Syringe-Needle U-100 (BD INSULIN SYRINGE ULTRAFINE) 31G X 5/16" 1 ML MISC USE AS DIRECTED  100 each  11  . isosorbide mononitrate (IMDUR) 30 MG 24 hr tablet TAKE ONE (1) TABLET EACH DAY  30 tablet  3  . LORazepam (ATIVAN) 0.5 MG tablet Take 0.5-1 tablets (0.25-0.5 mg total) by mouth at bedtime as needed (sleep).  30 tablet  1  . Multiple Vitamin (MULTIVITAMIN) tablet Take 1 tablet by mouth daily.      . Omega-3 Fatty Acids (FISH OIL) 1000 MG CAPS Take 1,000 mg by mouth daily.      . potassium chloride (K-DUR) 10 MEQ tablet Take 1 tablet (10 mEq total) by mouth 2 (two) times daily.  60 tablet  6  . quinapril (ACCUPRIL) 10 MG tablet Take 1 tablet (10 mg total) by mouth daily.  90 tablet  3  . sitaGLIPtin (JANUVIA) 100 MG tablet Take 100 mg by mouth daily.      Marland Kitchen torsemide (DEMADEX) 20 MG tablet Take 2 tablets (40 mg total) by mouth 2 (two) times daily.  120 tablet  6  . triamcinolone (KENALOG) 0.1 % cream Apply topically 2 (two)  times daily.        Marland Kitchen warfarin (COUMADIN) 5 MG tablet As directed.      Marland Kitchen DISCONTD: glipiZIDE (GLUCOTROL) 5 MG tablet Take 1 tablet (5 mg total) by mouth daily before breakfast.  30 tablet  11    Review of Systems  Constitutional: Negative.   HENT: Negative.   Eyes: Negative.   Musculoskeletal: Positive for myalgias.       Leg swelling on the right with bruising  Skin: Negative.   Neurological: Negative.   Hematological: Negative.   Psychiatric/Behavioral: Negative.   All other systems reviewed and are negative.    BP 98/58  Pulse 60  Ht 5\' 9"  (1.753 m)  Wt 167 lb 8 oz (75.978 kg)  BMI 24.74 kg/m2  Physical Exam  Nursing note and vitals reviewed. Constitutional: He is oriented to person, place, and time. He appears well-developed and well-nourished.  HENT:  Head: Normocephalic.  Nose: Nose  normal.  Mouth/Throat: Oropharynx is clear and moist.  Eyes: Conjunctivae normal are normal. Pupils are equal, round, and reactive to light.  Neck: Normal range of motion. Neck supple. No JVD present.  Cardiovascular: Normal rate, regular rhythm, S1 normal, S2 normal and intact distal pulses.  Exam reveals no gallop and no friction rub.   Murmur heard.  Crescendo systolic murmur is present with a grade of 2/6       Region of swelling in the right thigh that is firm consistent with hematoma. Chronic  Pulmonary/Chest: Effort normal and breath sounds normal. No respiratory distress. He has no wheezes. He has no rales. He exhibits no tenderness.  Abdominal: Soft. Bowel sounds are normal. He exhibits no distension. There is no tenderness.  Musculoskeletal: Normal range of motion. He exhibits no edema and no tenderness.  Lymphadenopathy:    He has no cervical adenopathy.  Neurological: He is alert and oriented to person, place, and time. Coordination normal.  Skin: Skin is warm and dry. No rash noted. No erythema.  Psychiatric: He has a normal mood and affect. His behavior is normal. Judgment and thought content normal.           Assessment and Plan

## 2012-05-24 NOTE — Assessment & Plan Note (Signed)
Appears relatively euvolemic with excellent weight, down 13 pounds from his last clinic visit. We have suggested he use extra torsemide as needed for any weight gain. Goal weight 170 or less, holding diuretics for weight less than 165 pounds. He did not want lab work done today, prefers to have this done with Dr. Alphonsus Sias (" it is cheaper").

## 2012-05-24 NOTE — Assessment & Plan Note (Signed)
Blood pressure is well controlled on today's visit. No changes made to the medications. 

## 2012-05-24 NOTE — Patient Instructions (Addendum)
You are doing well. No medication changes were made.  If your blood pressure runs very low, hold the isosorbide (imdur) that day   Please call us if you have new issues that need to be addressed before your next appt.  Your physician wants you to follow-up in: 6 months.  You will receive a reminder letter in the mail two months in advance. If you don't receive a letter, please call our office to schedule the follow-up appointment.

## 2012-05-24 NOTE — Assessment & Plan Note (Signed)
Maintaining normal sinus rhythm. No changes to his medications. 

## 2012-06-10 ENCOUNTER — Encounter: Payer: Self-pay | Admitting: Internal Medicine

## 2012-06-10 ENCOUNTER — Ambulatory Visit (INDEPENDENT_AMBULATORY_CARE_PROVIDER_SITE_OTHER): Payer: Medicare Other | Admitting: Internal Medicine

## 2012-06-10 ENCOUNTER — Ambulatory Visit: Payer: Medicare Other

## 2012-06-10 VITALS — BP 110/60 | HR 60 | Temp 97.8°F | Wt 161.0 lb

## 2012-06-10 DIAGNOSIS — L603 Nail dystrophy: Secondary | ICD-10-CM

## 2012-06-10 DIAGNOSIS — Z5181 Encounter for therapeutic drug level monitoring: Secondary | ICD-10-CM

## 2012-06-10 DIAGNOSIS — G459 Transient cerebral ischemic attack, unspecified: Secondary | ICD-10-CM

## 2012-06-10 DIAGNOSIS — Z23 Encounter for immunization: Secondary | ICD-10-CM

## 2012-06-10 DIAGNOSIS — L608 Other nail disorders: Secondary | ICD-10-CM

## 2012-06-10 DIAGNOSIS — Z7901 Long term (current) use of anticoagulants: Secondary | ICD-10-CM

## 2012-06-10 DIAGNOSIS — I4891 Unspecified atrial fibrillation: Secondary | ICD-10-CM

## 2012-06-10 LAB — POCT INR: INR: 4.3

## 2012-06-10 NOTE — Assessment & Plan Note (Signed)
Probably mycotic but only great toenails involved and not really thickened No infection or pain Discussed Rx---will just observe. He will trim himself for now

## 2012-06-10 NOTE — Progress Notes (Signed)
Subjective:    Patient ID: [name], male    DOB: 04/30/1941, 71 y.o.   MRN: [id number]  HPI Having trouble with his right great toenail Goes back a long time Was able to trim it after a shower  No pain No discharge  Doesn't remember any trauma  Current Outpatient Prescriptions on File Prior to Visit  Medication Sig Dispense Refill  . amiodarone (PACERONE) 200 MG tablet Take 1 tablet (200 mg total) by mouth daily.  60 tablet  1  . aspirin 81 MG tablet Take 81 mg by mouth daily.        Marland Kitchen atorvastatin (LIPITOR) 20 MG tablet TAKE ONE (1) TABLET EACH DAY  30 tablet  11  . B Complex-C (B-COMPLEX WITH VITAMIN C) tablet Take 1 tablet by mouth daily.        . carvedilol (COREG) 6.25 MG tablet Take 1 tablet (6.25 mg total) by mouth 2 (two) times daily with a meal.  60 tablet  5  . cholecalciferol (VITAMIN D) 1000 UNITS tablet Take 1,000 Units by mouth daily.      Marland Kitchen glucose blood (RELION PRIME TEST) test strip Test blood sugar 3-5 times daily, dx: 250.00  200 each  11  . insulin glargine (LANTUS) 100 UNIT/ML injection Inject 20 Units into the skin every morning.       . Insulin Syringe-Needle U-100 (BD INSULIN SYRINGE ULTRAFINE) 31G X 5/16" 1 ML MISC USE AS DIRECTED  100 each  11  . isosorbide mononitrate (IMDUR) 30 MG 24 hr tablet TAKE ONE (1) TABLET EACH DAY  30 tablet  3  . LORazepam (ATIVAN) 0.5 MG tablet Take 0.5-1 tablets (0.25-0.5 mg total) by mouth at bedtime as needed (sleep).  30 tablet  1  . Multiple Vitamin (MULTIVITAMIN) tablet Take 1 tablet by mouth daily.      . Omega-3 Fatty Acids (FISH OIL) 1000 MG CAPS Take 1,000 mg by mouth daily.      . potassium chloride (K-DUR) 10 MEQ tablet Take 1 tablet (10 mEq total) by mouth 2 (two) times daily.  60 tablet  6  . quinapril (ACCUPRIL) 10 MG tablet Take 1 tablet (10 mg total) by mouth daily.  90 tablet  3  . sitaGLIPtin (JANUVIA) 100 MG tablet Take 100 mg by mouth daily.      Marland Kitchen torsemide (DEMADEX) 20 MG tablet Take 2 tablets (40 mg  total) by mouth 2 (two) times daily.  120 tablet  6  . triamcinolone (KENALOG) 0.1 % cream Apply topically 2 (two) times daily.        Marland Kitchen warfarin (COUMADIN) 5 MG tablet As directed.        No Known Allergies  Past Medical History  Diagnosis Date  . Atrial fibrillation      S/P ablation  . CAD (coronary artery disease)     S/P MI 1980's----------------------Dr McDowell  . Hyperlipidemia   . CHF (congestive heart failure)   . 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 BPH (benign prostatic hypertrophy)   . Stroke   . Sleep disturbance     Past Surgical History  Procedure Date  . Coronary artery bypass graft   . Appendectomy   . Tonsillectomy   . Upper gastrointestinal endoscopy     Gastritis , ? H. pylori 08/28/1999  . Atrial ablation surgery     No family history on file.  History   Social History  . Marital Status: Single  Spouse Name: N/A    Number of Children: N/A  . Years of Education: N/A   Occupational History  . disabled due to heart disease    Social History Main Topics  . Smoking status: Former Smoker -- 1.0 packs/day    Types: Cigarettes    Quit date: 07/15/1973  . Smokeless tobacco: Never Used  . Alcohol Use: No  . Drug Use: No  . Sexually Active: Not on file   Other Topics Concern  . Not on file   Social History Narrative   Single--lives aloneStays with sister with Altzheimer's frequentlyHas living willBrother, Gerlene Burdock has health care POAWould accept resuscitation but no prolonged artificial ventilationNo feeding tube if cognitively unaware   Review of Systems No fever No sig edema     Objective:   Physical Exam  Constitutional: He appears well-developed and well-nourished. No distress.  Skin:       Right great toenail is dark and thickened No discharge, redness or tenderness Very slight distal dystrophy on lateral left great toenail          Assessment & Plan:

## 2012-06-10 NOTE — Patient Instructions (Signed)
Hold x 2 days then  5 mg daily recheck in 4 days

## 2012-06-11 ENCOUNTER — Ambulatory Visit: Payer: Medicare Other

## 2012-06-14 ENCOUNTER — Ambulatory Visit (INDEPENDENT_AMBULATORY_CARE_PROVIDER_SITE_OTHER): Payer: Medicare Other | Admitting: Internal Medicine

## 2012-06-14 ENCOUNTER — Ambulatory Visit: Payer: Medicare Other | Admitting: Internal Medicine

## 2012-06-14 ENCOUNTER — Ambulatory Visit: Payer: Medicare Other

## 2012-06-14 DIAGNOSIS — Z5181 Encounter for therapeutic drug level monitoring: Secondary | ICD-10-CM

## 2012-06-14 DIAGNOSIS — I4891 Unspecified atrial fibrillation: Secondary | ICD-10-CM

## 2012-06-14 DIAGNOSIS — Z7901 Long term (current) use of anticoagulants: Secondary | ICD-10-CM

## 2012-06-14 NOTE — Patient Instructions (Signed)
5 mg daily ( reduced dose by 5 mg weekly) check in 2 weeks

## 2012-06-28 ENCOUNTER — Ambulatory Visit (INDEPENDENT_AMBULATORY_CARE_PROVIDER_SITE_OTHER): Payer: Medicare Other | Admitting: Internal Medicine

## 2012-06-28 ENCOUNTER — Telehealth: Payer: Self-pay | Admitting: Radiology

## 2012-06-28 DIAGNOSIS — Z5181 Encounter for therapeutic drug level monitoring: Secondary | ICD-10-CM

## 2012-06-28 DIAGNOSIS — G459 Transient cerebral ischemic attack, unspecified: Secondary | ICD-10-CM

## 2012-06-28 DIAGNOSIS — Z7901 Long term (current) use of anticoagulants: Secondary | ICD-10-CM

## 2012-06-28 DIAGNOSIS — I4891 Unspecified atrial fibrillation: Secondary | ICD-10-CM

## 2012-06-28 NOTE — Telephone Encounter (Signed)
Please let him know that I can take over managing his diabetes and review that at his regular visits as long as he has no acute issues now

## 2012-06-28 NOTE — Patient Instructions (Signed)
Continue current dose, check in 4 weeks  

## 2012-06-28 NOTE — Telephone Encounter (Signed)
Patient wants to know if you would manage his Diabetes  here, he has been going to an endocrinologist and due to higher co pays he would like for you to manage his care.

## 2012-06-29 NOTE — Telephone Encounter (Signed)
Spoke with patient and advised results   

## 2012-07-13 ENCOUNTER — Other Ambulatory Visit: Payer: Self-pay | Admitting: Cardiovascular Disease

## 2012-07-20 ENCOUNTER — Ambulatory Visit (INDEPENDENT_AMBULATORY_CARE_PROVIDER_SITE_OTHER): Payer: Medicare Other | Admitting: General Practice

## 2012-07-20 DIAGNOSIS — I4891 Unspecified atrial fibrillation: Secondary | ICD-10-CM

## 2012-07-20 DIAGNOSIS — G459 Transient cerebral ischemic attack, unspecified: Secondary | ICD-10-CM

## 2012-07-20 DIAGNOSIS — Z7901 Long term (current) use of anticoagulants: Secondary | ICD-10-CM

## 2012-07-20 LAB — POCT INR: INR: 2.4

## 2012-07-20 NOTE — Patient Instructions (Signed)
Continue to take 5 mg daily recheck in 3 weeks.

## 2012-08-02 ENCOUNTER — Other Ambulatory Visit: Payer: Self-pay

## 2012-08-02 MED ORDER — ISOSORBIDE MONONITRATE ER 30 MG PO TB24: 30.0000 mg | ORAL_TABLET | Freq: Every day | ORAL | Status: DC

## 2012-08-02 NOTE — Telephone Encounter (Signed)
Refill sent for isosorbide 30 mg one tablet daily.

## 2012-08-09 ENCOUNTER — Ambulatory Visit (INDEPENDENT_AMBULATORY_CARE_PROVIDER_SITE_OTHER): Payer: Medicare Other | Admitting: General Practice

## 2012-08-09 DIAGNOSIS — Z7901 Long term (current) use of anticoagulants: Secondary | ICD-10-CM

## 2012-08-09 DIAGNOSIS — G459 Transient cerebral ischemic attack, unspecified: Secondary | ICD-10-CM

## 2012-08-09 DIAGNOSIS — I4891 Unspecified atrial fibrillation: Secondary | ICD-10-CM

## 2012-08-09 NOTE — Patient Instructions (Signed)
Take 1 1/2 tablet today and then continue to take 5 mg daily recheck in 3 weeks.

## 2012-08-19 ENCOUNTER — Ambulatory Visit (INDEPENDENT_AMBULATORY_CARE_PROVIDER_SITE_OTHER): Payer: Medicare Other | Admitting: Internal Medicine

## 2012-08-19 ENCOUNTER — Encounter: Payer: Self-pay | Admitting: Internal Medicine

## 2012-08-19 VITALS — BP 108/60 | HR 64 | Temp 97.7°F | Ht 69.0 in | Wt 157.0 lb

## 2012-08-19 DIAGNOSIS — I4891 Unspecified atrial fibrillation: Secondary | ICD-10-CM

## 2012-08-19 DIAGNOSIS — I509 Heart failure, unspecified: Secondary | ICD-10-CM

## 2012-08-19 DIAGNOSIS — Z0000 Encounter for general adult medical examination without abnormal findings: Secondary | ICD-10-CM

## 2012-08-19 DIAGNOSIS — Z1211 Encounter for screening for malignant neoplasm of colon: Secondary | ICD-10-CM

## 2012-08-19 DIAGNOSIS — I10 Essential (primary) hypertension: Secondary | ICD-10-CM

## 2012-08-19 DIAGNOSIS — N400 Enlarged prostate without lower urinary tract symptoms: Secondary | ICD-10-CM

## 2012-08-19 DIAGNOSIS — E119 Type 2 diabetes mellitus without complications: Secondary | ICD-10-CM

## 2012-08-19 DIAGNOSIS — E785 Hyperlipidemia, unspecified: Secondary | ICD-10-CM

## 2012-08-19 DIAGNOSIS — I5022 Chronic systolic (congestive) heart failure: Secondary | ICD-10-CM

## 2012-08-19 LAB — HM DIABETES FOOT EXAM

## 2012-08-19 MED ORDER — GLUCOSE BLOOD VI STRP: ORAL_STRIP | Status: DC

## 2012-08-19 NOTE — Patient Instructions (Signed)
Please call Dr Marlyne Beards to set up your diabetic eye exam for this year

## 2012-08-19 NOTE — Assessment & Plan Note (Signed)
BP Readings from Last 3 Encounters:  08/19/12 108/60  06/10/12 110/60  05/24/12 98/58   Good control

## 2012-08-19 NOTE — Assessment & Plan Note (Signed)
Regular still On amiodarone Coumadin still

## 2012-08-19 NOTE — Assessment & Plan Note (Signed)
Due for labs No side effects
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
## 2012-08-19 NOTE — Assessment & Plan Note (Signed)
Stable fluid status No symptoms Same meds

## 2012-08-19 NOTE — Assessment & Plan Note (Signed)
Lab Results  Component Value Date   HGBA1C 7.7* 01/19/2012   Will recheck He has decided to get his care here rather than with Dr Altheimer

## 2012-08-19 NOTE — Assessment & Plan Note (Signed)
Symptoms have improved No Rx needed

## 2012-08-19 NOTE — Assessment & Plan Note (Signed)
Doing fairly well Will do stool immunoassay after discussion----and no PSA Rx for zostavax

## 2012-08-20 ENCOUNTER — Other Ambulatory Visit: Payer: Self-pay | Admitting: *Deleted

## 2012-08-20 ENCOUNTER — Other Ambulatory Visit (INDEPENDENT_AMBULATORY_CARE_PROVIDER_SITE_OTHER): Payer: Medicare Other

## 2012-08-20 LAB — BASIC METABOLIC PANEL
BUN: 28 mg/dL — ABNORMAL HIGH (ref 6–23)
Calcium: 9.2 mg/dL (ref 8.4–10.5)
GFR: 60.57 mL/min (ref >60.00)
Glucose, Bld: 152 mg/dL — ABNORMAL HIGH (ref 70–99)
Sodium: 137 mEq/L (ref 135–145)

## 2012-08-20 LAB — LIPID PANEL
Total CHOL/HDL Ratio: 4
Triglycerides: 95 mg/dL (ref 0.0–149.0)

## 2012-08-20 LAB — CBC WITH DIFFERENTIAL/PLATELET
Basophils Absolute: 0 10*3/uL (ref 0.0–0.1)
Lymphocytes Relative: 15 % (ref 12.0–46.0)
Monocytes Relative: 11.9 % (ref 3.0–12.0)
Platelets: 146 10*3/uL — ABNORMAL LOW (ref 150.0–400.0)
RDW: 16.3 % — ABNORMAL HIGH (ref 11.5–14.6)

## 2012-08-20 LAB — HEPATIC FUNCTION PANEL
Alkaline Phosphatase: 232 U/L — ABNORMAL HIGH (ref 39–117)
Bilirubin, Direct: 0.3 mg/dL (ref 0.0–0.3)
Total Bilirubin: 1.5 mg/dL — ABNORMAL HIGH (ref 0.3–1.2)

## 2012-08-20 LAB — TSH: TSH: 4.3 u[IU]/mL (ref 0.35–5.50)

## 2012-08-20 MED ORDER — QUINAPRIL HCL 10 MG PO TABS: 10.0000 mg | ORAL_TABLET | Freq: Every day | ORAL | Status: DC

## 2012-08-20 NOTE — Telephone Encounter (Signed)
Refilled Quinapril 10 mg quantity#90 refill 3.

## 2012-08-26 ENCOUNTER — Encounter: Payer: Self-pay | Admitting: *Deleted

## 2012-08-30 ENCOUNTER — Ambulatory Visit (INDEPENDENT_AMBULATORY_CARE_PROVIDER_SITE_OTHER): Payer: Medicare Other | Admitting: General Practice

## 2012-08-30 DIAGNOSIS — G459 Transient cerebral ischemic attack, unspecified: Secondary | ICD-10-CM

## 2012-08-30 DIAGNOSIS — I4891 Unspecified atrial fibrillation: Secondary | ICD-10-CM

## 2012-08-30 DIAGNOSIS — Z7901 Long term (current) use of anticoagulants: Secondary | ICD-10-CM

## 2012-08-30 NOTE — Patient Instructions (Signed)
Take 1 1/2 tablet today and then change dosage to 1 tablet every day except take 1 1/2 tablets on Monday.  Re-check in 4 weeks.

## 2012-09-02 ENCOUNTER — Other Ambulatory Visit: Payer: Self-pay | Admitting: Internal Medicine

## 2012-09-23 ENCOUNTER — Other Ambulatory Visit: Payer: Self-pay | Admitting: *Deleted

## 2012-09-23 MED ORDER — POTASSIUM CHLORIDE ER 10 MEQ PO TBCR: 10.0000 meq | EXTENDED_RELEASE_TABLET | Freq: Two times a day (BID) | ORAL | Status: DC

## 2012-09-23 NOTE — Telephone Encounter (Signed)
Refilled Klor Con 10 meq sent to [facility] pharmacy.

## 2012-09-27 ENCOUNTER — Ambulatory Visit (INDEPENDENT_AMBULATORY_CARE_PROVIDER_SITE_OTHER): Payer: Medicare Other | Admitting: General Practice

## 2012-09-27 DIAGNOSIS — Z5181 Encounter for therapeutic drug level monitoring: Secondary | ICD-10-CM

## 2012-09-27 DIAGNOSIS — G459 Transient cerebral ischemic attack, unspecified: Secondary | ICD-10-CM

## 2012-09-27 DIAGNOSIS — I4891 Unspecified atrial fibrillation: Secondary | ICD-10-CM

## 2012-09-27 DIAGNOSIS — Z7901 Long term (current) use of anticoagulants: Secondary | ICD-10-CM

## 2012-09-27 NOTE — Patient Instructions (Signed)
Do not take coumadin today and then continue to take 1 tablet every day except take 1 1/2 tablets on Monday.  Re-check in 4 weeks.

## 2012-10-25 ENCOUNTER — Ambulatory Visit (INDEPENDENT_AMBULATORY_CARE_PROVIDER_SITE_OTHER): Payer: Medicare Other | Admitting: General Practice

## 2012-10-25 DIAGNOSIS — G459 Transient cerebral ischemic attack, unspecified: Secondary | ICD-10-CM

## 2012-10-25 DIAGNOSIS — Z5181 Encounter for therapeutic drug level monitoring: Secondary | ICD-10-CM

## 2012-10-25 DIAGNOSIS — Z7901 Long term (current) use of anticoagulants: Secondary | ICD-10-CM

## 2012-10-25 DIAGNOSIS — I4891 Unspecified atrial fibrillation: Secondary | ICD-10-CM

## 2012-10-25 NOTE — Patient Instructions (Signed)
Continue to take 1 tablet every day except take 1 1/2 tablets on Monday.  Re-check in 4 weeks.

## 2012-10-29 ENCOUNTER — Telehealth: Payer: Self-pay

## 2012-10-29 NOTE — Telephone Encounter (Signed)
Pt states he is having swelling in his feet and a "little SOB" . Please call

## 2012-10-29 NOTE — Telephone Encounter (Signed)
Pt reports LE edema and some worsening sob Also c/o nausea and vomiting associated with hiccups Denies CP He is unsure what his weight is at home He no longer monitors BP since machine is "broken" He is taking torsemide 20 mg PO qd I advised ok to take an extra torsemide today and tomm and see if this helps I will reassess Monday 3/31 In the meantime, he will call PCP re: nausea and vomiting He will monitor symptoms of hypotension (dizziness or lightheadedness) and will decrease torsemide dose back to 1/day if these occur Understanding verb

## 2012-11-01 ENCOUNTER — Ambulatory Visit (INDEPENDENT_AMBULATORY_CARE_PROVIDER_SITE_OTHER): Payer: Medicare Other | Admitting: Internal Medicine

## 2012-11-01 ENCOUNTER — Telehealth: Payer: Self-pay

## 2012-11-01 ENCOUNTER — Encounter: Payer: Self-pay | Admitting: Internal Medicine

## 2012-11-01 VITALS — BP 100/56 | HR 62 | Temp 97.8°F | Wt 161.0 lb

## 2012-11-01 DIAGNOSIS — R160 Hepatomegaly, not elsewhere classified: Secondary | ICD-10-CM | POA: Insufficient documentation

## 2012-11-01 NOTE — Telephone Encounter (Signed)
Symptoms:

## 2012-11-01 NOTE — Telephone Encounter (Signed)
Pt says edema and sob were helped "a little bit" by taking extra diuretics over w/e He continues to take 2 torsemide per day I advised ok to do this x 2 more days then resume 1 tablet daily He verb understanding  He went to PCP today re:abd distension and nausea Was told he has hepatomegaly and is scheduled for abdominal u/s Thursday 4/4 I will call him Thursday to reassess edema and sob

## 2012-11-01 NOTE — Progress Notes (Signed)
Subjective:    Patient ID: [name], male    DOB: November 19, 1940, 72 y.o.   MRN: [id number]  HPI Having trouble lately with nausea Better today but has had some problems lately Sick after having [place] hotdogs----actually vomited after eating them 3 days ago Bad 2 days ago and slowly improving yesterday  Intermittent constipation---uses stool softener Did go well today but may have caused the problems 3 days ago  No abdominal pain Appetite has worsened---but weight up a few pounds No fever Does note that the nausea tends to be postprandial  Current Outpatient Prescriptions on File Prior to Visit  Medication Sig Dispense Refill  . amiodarone (PACERONE) 200 MG tablet TAKE 1 TABLET BY MOUTH ONCE A DAY  30 tablet  4  . aspirin 81 MG tablet Take 81 mg by mouth daily.        Marland Kitchen atorvastatin (LIPITOR) 20 MG tablet TAKE ONE (1) TABLET EACH DAY  30 tablet  11  . B Complex-C (B-COMPLEX WITH VITAMIN C) tablet Take 1 tablet by mouth daily.        . carvedilol (COREG) 6.25 MG tablet Take 1 tablet (6.25 mg total) by mouth 2 (two) times daily with a meal.  60 tablet  5  . cholecalciferol (VITAMIN D) 1000 UNITS tablet Take 1,000 Units by mouth daily.      Marland Kitchen glucose blood (RELION PRIME TEST) test strip Test blood sugar 3-5 times daily, dx: 250.00  200 each  11  . Insulin Syringe-Needle U-100 (BD INSULIN SYRINGE ULTRAFINE) 31G X 5/16" 1 ML MISC USE AS DIRECTED  100 each  11  . isosorbide mononitrate (IMDUR) 30 MG 24 hr tablet Take 1 tablet (30 mg total) by mouth daily.  30 tablet  6  . LORazepam (ATIVAN) 0.5 MG tablet Take 0.5-1 tablets (0.25-0.5 mg total) by mouth at bedtime as needed (sleep).  30 tablet  1  . Multiple Vitamin (MULTIVITAMIN) tablet Take 1 tablet by mouth daily.      . Omega-3 Fatty Acids (FISH OIL) 1000 MG CAPS Take 1,000 mg by mouth daily.      . potassium chloride (K-DUR) 10 MEQ tablet Take 1 tablet (10 mEq total) by mouth 2 (two) times daily.  60 tablet  6  . quinapril  (ACCUPRIL) 10 MG tablet Take 1 tablet (10 mg total) by mouth daily.  90 tablet  3  . sitaGLIPtin (JANUVIA) 100 MG tablet Take 100 mg by mouth daily.      Marland Kitchen torsemide (DEMADEX) 20 MG tablet Take 2 tablets (40 mg total) by mouth 2 (two) times daily.  120 tablet  6  . triamcinolone (KENALOG) 0.1 % cream Apply topically 2 (two) times daily.        Marland Kitchen warfarin (COUMADIN) 5 MG tablet TAKE AS DIRECTED BY ANTI-COAGULATION CLINIC  50 tablet  2   No current facility-administered medications on file prior to visit.    No Known Allergies  Past Medical History  Diagnosis Date  . Atrial fibrillation      S/P ablation  . CAD (coronary artery disease)     S/P MI 1980's----------------------Dr McDowell  . Hyperlipidemia   . CHF (congestive heart failure)   . 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 BPH (benign prostatic hypertrophy)   . Stroke   . Sleep disturbance     Past Surgical History  Procedure Laterality Date  . Coronary artery bypass graft    . Appendectomy    .  Tonsillectomy    . Upper gastrointestinal endoscopy      Gastritis , ? H. pylori 08/28/1999  . Atrial ablation surgery      No family history on file.  History   Social History  . Marital Status: Single    Spouse Name: N/A    Number of Children: N/A  . Years of Education: N/A   Occupational History  . disabled due to heart disease    Social History Main Topics  . Smoking status: Former Smoker -- 1.00 packs/day    Types: Cigarettes    Quit date: 07/15/1973  . Smokeless tobacco: Never Used  . Alcohol Use: No  . Drug Use: No  . Sexually Active: Not on file   Other Topics Concern  . Not on file   Social History Narrative   Single--lives alone   Stays with sister with Altzheimer's frequently   Has living will   Brother, Gerlene Burdock has health care POA   Would accept resuscitation but no prolonged artificial ventilation   No feeding tube if cognitively unaware             Review of Systems Gets some urinary urgency---then he may fart (Iike voiding released the pressure) May have some clear discharge then No blood in stool and normal color No cough but breathing seems worse (ongoing DOE) Larey Seat right after last visit---right leg seems weak    Objective:   Physical Exam  Constitutional: He appears well-developed. No distress.  Neck: Normal range of motion. Neck supple.  Cardiovascular: Normal rate and regular rhythm.  Exam reveals no gallop.   Murmur heard. Soft systolic murmur  Pulmonary/Chest: Effort normal and breath sounds normal. No respiratory distress. He has no wheezes.  Scant basilar crackles  Abdominal: Soft. Bowel sounds are normal. He exhibits no distension. There is no tenderness. There is no rebound and no guarding.  Liver enlarged---down 4 cm from [facility] Non tender Spleen not enlarged  Musculoskeletal: He exhibits edema.  Slight ankle and pedal edema  Lymphadenopathy:    He has no cervical adenopathy.  Psychiatric: He has a normal mood and affect. His behavior is normal.          Assessment & Plan:

## 2012-11-01 NOTE — Assessment & Plan Note (Signed)
Had mild transaminase and alk phos elevations last time Now with nausea and appetite poor Likely related to whatever is causing liver enlargement Will start with ultrasound and proceed from there

## 2012-11-04 ENCOUNTER — Ambulatory Visit
Admission: RE | Admit: 2012-11-04 | Discharge: 2012-11-04 | Disposition: A | Discharge status: Home / Self Care | Payer: Medicare Other | Source: Ambulatory Visit | Attending: Internal Medicine | Admitting: Internal Medicine

## 2012-11-04 ENCOUNTER — Telehealth: Payer: Self-pay

## 2012-11-04 ENCOUNTER — Encounter: Payer: Self-pay | Admitting: *Deleted

## 2012-11-04 DIAGNOSIS — R160 Hepatomegaly, not elsewhere classified: Secondary | ICD-10-CM

## 2012-11-04 NOTE — Telephone Encounter (Signed)
Assess edema and sob

## 2012-11-04 NOTE — Telephone Encounter (Signed)
Pt reports he "feels like a new man!" Says his edema and sob have improved on the extra torsemide I advised ok to go back to usual dosing of 1 tablet daily He will do this and will call us should symptoms return He had abd. U/s today as scheduled; Dr. Demaris Callander to call pt with results

## 2012-11-10 ENCOUNTER — Telehealth: Payer: Self-pay

## 2012-11-10 ENCOUNTER — Other Ambulatory Visit: Payer: Self-pay | Admitting: *Deleted

## 2012-11-10 MED ORDER — CARVEDILOL 6.25 MG PO TABS: 6.2500 mg | ORAL_TABLET | Freq: Two times a day (BID) | ORAL | Status: DC

## 2012-11-10 NOTE — Telephone Encounter (Signed)
Okay to try torsemide 2 tabs for a short period until edema improves  then back to 1 tab Would take additional tab as needed for worsening edema

## 2012-11-10 NOTE — Telephone Encounter (Signed)
Pt says he is having LE edema again, as he did a week or so ago He is taking torsemide 1 tablet daily He says he used to take 2 tablets daily but decreased that "becuase of my kidneys" He asks if ok to take torsemide 2 tablets daily to see if this helps his edema I advised ok but I will have Dr. Mariah Milling review as well  Denies worsening sob or orthopnea Has appt with [place] 4/23 Will keep this appt I offered sooner appt 4/11 but he declined

## 2012-11-10 NOTE — Telephone Encounter (Signed)
Pt  Called and states feet are swelling again. Please call.

## 2012-11-10 NOTE — Telephone Encounter (Signed)
Refilled Carvedilol sent to [facility].

## 2012-11-11 NOTE — Telephone Encounter (Signed)
Pt informed Understanding verb 

## 2012-11-24 ENCOUNTER — Ambulatory Visit (INDEPENDENT_AMBULATORY_CARE_PROVIDER_SITE_OTHER): Payer: Medicare Other | Admitting: Cardiovascular Disease

## 2012-11-24 ENCOUNTER — Other Ambulatory Visit: Payer: Self-pay

## 2012-11-24 ENCOUNTER — Encounter: Payer: Self-pay | Admitting: Cardiovascular Disease

## 2012-11-24 VITALS — BP 112/50 | HR 66 | Ht 69.0 in | Wt 163.8 lb

## 2012-11-24 DIAGNOSIS — I5022 Chronic systolic (congestive) heart failure: Secondary | ICD-10-CM

## 2012-11-24 DIAGNOSIS — E119 Type 2 diabetes mellitus without complications: Secondary | ICD-10-CM

## 2012-11-24 DIAGNOSIS — R0602 Shortness of breath: Secondary | ICD-10-CM

## 2012-11-24 DIAGNOSIS — I509 Heart failure, unspecified: Secondary | ICD-10-CM

## 2012-11-24 DIAGNOSIS — I4891 Unspecified atrial fibrillation: Secondary | ICD-10-CM

## 2012-11-24 DIAGNOSIS — I10 Essential (primary) hypertension: Secondary | ICD-10-CM

## 2012-11-24 DIAGNOSIS — R609 Edema, unspecified: Secondary | ICD-10-CM

## 2012-11-24 MED ORDER — ATORVASTATIN CALCIUM 20 MG PO TABS: ORAL_TABLET | ORAL | Status: DC

## 2012-11-24 MED ORDER — SITAGLIPTIN PHOSPHATE 100 MG PO TABS: 100.0000 mg | ORAL_TABLET | Freq: Every day | ORAL | Status: DC

## 2012-11-24 MED ORDER — INSULIN DETEMIR 100 UNIT/ML ~~LOC~~ SOLN: 20.0000 [IU] | Freq: Every day | SUBCUTANEOUS | Status: DC

## 2012-11-24 NOTE — Assessment & Plan Note (Signed)
Blood pressure is well controlled on today's visit. No changes made to the medications. 

## 2012-11-24 NOTE — Progress Notes (Signed)
Patient ID: [name], male    DOB: 24-Nov-1940, 72 y.o.   MRN: [id number]  HPI Comments: [name] is a pleasant 72 year old gentleman with past medical history of diabetes, coronary artery disease, bypass 1996, Atrial fibrillation with history of ablation, hyperlipidemia, obstructive sleep apnea, hypertension and COPD who presents for routine followup. 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 Inferior wall defect was seen on stress test in 2011.  Hx of nightmares which wake him up.   Episodes of paroxysmal atrial fibrillation.   significant weight loss since his clinic visit in 2013, down from 180 pounds at which time [name] had significant edema and abdominal swelling with shortness of breath.  [name] is down 17 pounds on torsemide. Previous goal weight was 170 pounds. [name] now weighs 163 pounds. Not eating as much. Thinning in the face. [name] continues to have mild edema, shortness of breath with exertion is mild. [name] has been taking torsemide 20 mg twice a day. Weight down 4 pounds from his last clinic visit.   [name] reports that his Sugars have been running up, and at night it is low. Sees  endocrine in [place].   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 amiodarone (PACERONE) 200 MG tablet TAKE 1 TABLET BY MOUTH ONCE A DAY  30 tablet  4  . aspirin 81 MG tablet Take 81 mg by mouth daily.        . B Complex-C (B-COMPLEX WITH VITAMIN C) tablet Take 1 tablet by mouth daily.        . carvedilol (COREG) 6.25 MG tablet Take 1 tablet (6.25 mg total) by mouth 2 (two) times daily with a meal.  60 tablet  5  . cholecalciferol (VITAMIN D) 1000 UNITS tablet Take 1,000 Units by mouth daily.      Marland Kitchen glucose blood (RELION PRIME TEST) test strip Test blood sugar 3-5 times daily, dx:  250.00  200 each  11  . Insulin Syringe-Needle U-100 (BD INSULIN SYRINGE ULTRAFINE) 31G X 5/16" 1 ML MISC USE AS DIRECTED  100 each  11  . isosorbide mononitrate (IMDUR) 30 MG 24 hr tablet Take 1 tablet (30 mg total) by mouth daily.  30 tablet  6  . LORazepam (ATIVAN) 0.5 MG tablet Take 0.5-1 tablets (0.25-0.5 mg total) by mouth at bedtime as needed (sleep).  30 tablet  1  . Multiple Vitamin (MULTIVITAMIN) tablet Take 1 tablet by mouth daily.      . Omega-3 Fatty Acids (FISH OIL) 1000 MG CAPS Take 1,000 mg by mouth daily.      . potassium chloride (K-DUR) 10 MEQ tablet Take 1 tablet (10 mEq total) by mouth 2 (two) times daily.  60 tablet  6  . quinapril (ACCUPRIL) 10 MG tablet Take 1 tablet (10 mg total) by mouth daily.  90 tablet  3  . torsemide (DEMADEX) 20 MG tablet Take 2 tablets (40 mg total) by mouth 2 (two) times daily.  120 tablet  6  . triamcinolone (KENALOG) 0.1 % cream Apply topically 2 (two) times daily.        . vitamin E 400 UNIT capsule Take 400 Units by mouth daily.      Marland Kitchen warfarin (COUMADIN) 5 MG tablet TAKE AS DIRECTED BY ANTI-COAGULATION CLINIC  50 tablet  2  . [DISCONTINUED] atorvastatin (LIPITOR) 20 MG tablet TAKE ONE (1) TABLET EACH DAY  30 tablet  11  . [DISCONTINUED] insulin detemir (LEVEMIR) 100 UNIT/ML injection Inject 20 Units into the skin daily before breakfast.      . [DISCONTINUED] sitaGLIPtin (JANUVIA) 100 MG tablet Take 100 mg by mouth daily.       No facility-administered encounter medications on file as of 11/24/2012.    Review of Systems  Constitutional: Negative.   HENT: Negative.   Eyes: Negative.   Respiratory: Positive for shortness of breath.   Cardiovascular: Positive for leg swelling.  Musculoskeletal: Positive for myalgias.       Leg swelling on the right with bruising  Skin: Negative.   Neurological: Negative.   Psychiatric/Behavioral: Negative.   All other systems reviewed and are negative.    BP 112/50  Pulse 66  Ht 5\' 9"  (1.753 m)   Wt 163 lb 12 oz (74.277 kg)  BMI 24.17 kg/m2  Physical Exam  Nursing note and vitals reviewed. Constitutional: [name] is oriented to person, place, and time. [name] appears well-developed and well-nourished.  HENT:  Head: Normocephalic.  Nose: Nose normal.  Mouth/Throat: Oropharynx is clear and moist.  Eyes: Conjunctivae are normal. Pupils are equal, round, and reactive to light.  Neck: Normal range of motion. Neck supple. No JVD present.  Cardiovascular: Normal rate, regular rhythm, S1 normal, S2 normal and intact distal pulses.  Exam reveals no gallop and no friction rub.   Murmur heard.  Crescendo systolic murmur is present with a grade of 2/6  Trace edema bilaterally below the knees  Pulmonary/Chest: Effort normal and breath sounds normal. No respiratory distress. [name] has no wheezes. [name] has no rales. [name] exhibits no tenderness.  Abdominal: Soft. Bowel sounds are normal. [name] exhibits no distension. There is no tenderness.  Musculoskeletal: Normal range of motion. [name] exhibits no edema and no tenderness.  Lymphadenopathy:    [name] has no cervical adenopathy.  Neurological: [name] is alert and oriented to person, place, and time. Coordination normal.  Skin: Skin is warm and dry. No rash noted. No erythema.  Psychiatric: [name] has a normal mood and affect. His behavior is normal. Judgment and thought content normal.      Assessment and Plan

## 2012-11-24 NOTE — Assessment & Plan Note (Signed)
Trace edema on today's visit

## 2012-11-24 NOTE — Telephone Encounter (Signed)
Pt request refill levemir, Januvia and Atorvastatin to [facility]; advised pt done.

## 2012-11-24 NOTE — Assessment & Plan Note (Signed)
Maintaining normal sinus rhythm. No changes made to his medications

## 2012-11-24 NOTE — Patient Instructions (Addendum)
You are doing well. No medication changes were made.  If you get dizzy getting out of bed, hold you diuretic for a day Keep your weight above 160 pounds  Please call us if you have new issues that need to be addressed before your next appt.  Your physician wants you to follow-up in: 6 months.  You will receive a reminder letter in the mail two months in advance. If you don't receive a letter, please call our office to schedule the follow-up appointment.

## 2012-11-24 NOTE — Assessment & Plan Note (Signed)
We have encouraged continued exercise, careful diet management. He reports his sugars have been running high.

## 2012-11-24 NOTE — Assessment & Plan Note (Signed)
Weight is stable, down several pounds from last clinic visit. I suspect he is eating less. Goal weight is now about 160 pounds. He can use extra torsemide as needed for edema or shortness of breath

## 2012-11-25 ENCOUNTER — Ambulatory Visit (INDEPENDENT_AMBULATORY_CARE_PROVIDER_SITE_OTHER): Payer: Medicare Other | Admitting: General Practice

## 2012-11-25 ENCOUNTER — Other Ambulatory Visit: Payer: Self-pay | Admitting: General Practice

## 2012-11-25 ENCOUNTER — Encounter: Payer: Self-pay | Admitting: Internal Medicine

## 2012-11-25 ENCOUNTER — Ambulatory Visit (INDEPENDENT_AMBULATORY_CARE_PROVIDER_SITE_OTHER): Payer: Medicare Other | Admitting: Internal Medicine

## 2012-11-25 ENCOUNTER — Telehealth: Payer: Self-pay | Admitting: Internal Medicine

## 2012-11-25 VITALS — BP 100/50 | HR 75 | Temp 98.9°F | Wt 164.0 lb

## 2012-11-25 DIAGNOSIS — R0982 Postnasal drip: Secondary | ICD-10-CM

## 2012-11-25 DIAGNOSIS — Z5181 Encounter for therapeutic drug level monitoring: Secondary | ICD-10-CM

## 2012-11-25 DIAGNOSIS — I4891 Unspecified atrial fibrillation: Secondary | ICD-10-CM

## 2012-11-25 DIAGNOSIS — G459 Transient cerebral ischemic attack, unspecified: Secondary | ICD-10-CM

## 2012-11-25 DIAGNOSIS — Z7901 Long term (current) use of anticoagulants: Secondary | ICD-10-CM

## 2012-11-25 LAB — POCT INR: INR: 2.5

## 2012-11-25 MED ORDER — WARFARIN SODIUM 5 MG PO TABS: 5.0000 mg | ORAL_TABLET | Freq: Every day | ORAL | Status: DC

## 2012-11-25 MED ORDER — PREDNISONE 20 MG PO TABS: 40.0000 mg | ORAL_TABLET | Freq: Every day | ORAL | Status: DC

## 2012-11-25 NOTE — Telephone Encounter (Signed)
I spoke with patient and he scheduled appointment at 4:45.

## 2012-11-25 NOTE — Assessment & Plan Note (Addendum)
With what sounds like a possible aspiration event last night Now with some wheezing and SOB No signs of secondary aspiration pneumonia Will treat with a few days of prednisone (short term shouldn't have time to affect protime too much and was fine today)  No history of allergies but nasal congestion Will have him try loratadine

## 2012-11-25 NOTE — Patient Instructions (Signed)
Continue to take 1 tablet every day except take 1 1/2 tablets on Monday.  Re-check in 4 weeks.  

## 2012-11-25 NOTE — Progress Notes (Signed)
Subjective:    Patient ID: [name], male    DOB: [birth year], 72 y.o.   MRN: [id number]  HPI Has had some drainage from nose---bad last night Eyes watering as well Feels he aspirated some of the post nasal drip and had trouble breathing Able to raise some mucus eventually---white  Bad sore throat this morning Doesn't have seasonal allergies  Some cough last night--improved now Some SOB--not clearly worsened No fever  Current Outpatient Prescriptions on File Prior to Visit  Medication Sig Dispense Refill  . amiodarone (PACERONE) 200 MG tablet TAKE 1 TABLET BY MOUTH ONCE A DAY  30 tablet  4  . aspirin 81 MG tablet Take 81 mg by mouth daily.        Marland Kitchen atorvastatin (LIPITOR) 20 MG tablet TAKE ONE (1) TABLET EACH DAY  90 tablet  1  . B Complex-C (B-COMPLEX WITH VITAMIN C) tablet Take 1 tablet by mouth daily.        . carvedilol (COREG) 6.25 MG tablet Take 1 tablet (6.25 mg total) by mouth 2 (two) times daily with a meal.  60 tablet  5  . cholecalciferol (VITAMIN D) 1000 UNITS tablet Take 1,000 Units by mouth daily.      Marland Kitchen glucose blood (RELION PRIME TEST) test strip Test blood sugar 3-5 times daily, dx: 250.00  200 each  11  . insulin detemir (LEVEMIR) 100 UNIT/ML injection Inject 0.2 mLs (20 Units total) into the skin daily before breakfast.  20 mL  1  . Insulin Syringe-Needle U-100 (BD INSULIN SYRINGE ULTRAFINE) 31G X 5/16" 1 ML MISC USE AS DIRECTED  100 each  11  . isosorbide mononitrate (IMDUR) 30 MG 24 hr tablet Take 1 tablet (30 mg total) by mouth daily.  30 tablet  6  . LORazepam (ATIVAN) 0.5 MG tablet Take 0.5-1 tablets (0.25-0.5 mg total) by mouth at bedtime as needed (sleep).  30 tablet  1  . Multiple Vitamin (MULTIVITAMIN) tablet Take 1 tablet by mouth daily.      . Omega-3 Fatty Acids (FISH OIL) 1000 MG CAPS Take 1,000 mg by mouth daily.      . potassium chloride (K-DUR) 10 MEQ tablet Take 1 tablet (10 mEq total) by mouth 2 (two) times daily.  60 tablet  6  . quinapril  (ACCUPRIL) 10 MG tablet Take 1 tablet (10 mg total) by mouth daily.  90 tablet  3  . sitaGLIPtin (JANUVIA) 100 MG tablet Take 1 tablet (100 mg total) by mouth daily.  90 tablet  1  . torsemide (DEMADEX) 20 MG tablet Take 2 tablets (40 mg total) by mouth 2 (two) times daily.  120 tablet  6  . triamcinolone (KENALOG) 0.1 % cream Apply topically 2 (two) times daily.        . vitamin E 400 UNIT capsule Take 400 Units by mouth daily.       No current facility-administered medications on file prior to visit.    No Known Allergies  Past Medical History  Diagnosis Date  . Atrial fibrillation      S/P ablation  . CAD (coronary artery disease)     S/P MI 1980's----------------------Dr McDowell  . Hyperlipidemia   . CHF (congestive heart failure)   . 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 BPH (benign prostatic hypertrophy)   . Stroke   . Sleep disturbance     Past Surgical History  Procedure Laterality Date  . Coronary artery bypass graft    .  Appendectomy    . Tonsillectomy    . Upper gastrointestinal endoscopy      Gastritis , ? H. pylori 08/28/1999  . Atrial ablation surgery      No family history on file.  History   Social History  . Marital Status: Single    Spouse Name: N/A    Number of Children: N/A  . Years of Education: N/A   Occupational History  . disabled due to heart disease    Social History Main Topics  . Smoking status: Former Smoker -- 1.00 packs/day    Types: Cigarettes    Quit date: 07/15/1973  . Smokeless tobacco: Never Used  . Alcohol Use: No  . Drug Use: No  . Sexually Active: Not on file   Other Topics Concern  . Not on file   Social History Narrative   Single--lives alone   Stays with sister with Altzheimer's frequently   Has living will   Brother, Gerlene Burdock has health care POA   Would accept resuscitation but no prolonged artificial ventilation   No feeding tube if cognitively unaware            Review  of Systems Able to eat some today after bringing up mucus--- seemed to help throat No vomiting or diarrhea    Objective:   Physical Exam  Constitutional: He appears well-developed and well-nourished. No distress.  HENT:  Mouth/Throat: Oropharynx is clear and moist. No oropharyngeal exudate.  Moderate pale nasal congestion --esp on left TMs normal  Neck: Normal range of motion. Neck supple.  Pulmonary/Chest: Effort normal. No respiratory distress. He has wheezes. He has rales.  Decreased breath sounds Mild exp prolongation and exp wheeze Bibasilar crackles (fine)  Lymphadenopathy:    He has no cervical adenopathy.          Assessment & Plan:

## 2012-11-25 NOTE — Patient Instructions (Signed)
Please take the prednisone as directed for 6 days (2 pills daily for 3 days, then 1 pill daily for 3 days). This should help your breathing. Try loratadine 10mg  daily or twice a day as needed for the nasal congestion and runny eyes.

## 2012-11-25 NOTE — Telephone Encounter (Signed)
Patient Information:  Caller Name: Laroy  Phone: [phone]  Patient: [name], [name]  Gender: Male  DOB: 12/22/1940  Age: 72 Years  PCP: Tillman Abide [facility])  Office Follow Up:  Does the office need to follow up with this patient?: Yes  Instructions For The Office: Appts unavailable in Epic for today; has coumadin clinic appt at 1600 11/25/12 and would like workin appt to be seen.  krs/can  RN Note:  c/o sore throat making it difficult to swallow.  States he has a coumadin clinic appt at 1600 11/25/12 but would like to be worked in.  States sore throat pain is severe and he is not able to take much in the way of fluids, but denies s/symptoms of dehydration.  Per sore throat protocol, advised appt today; no appts available in Epic.  Info to office for provider review/workin appt.  May reach patient at [phone].  krs/can  Symptoms  Reason For Call & Symptoms: sore throat  Reviewed Health History In EMR: Yes  Reviewed Medications In EMR: Yes  Reviewed Allergies In EMR: Yes  Reviewed Surgeries / Procedures: Yes  Date of Onset of Symptoms: 11/24/2012  Guideline(s) Used:  Sore Throat  Disposition Per Guideline:   See Today in Office  Reason For Disposition Reached:   Severe sore throat pain  Advice Given:  N/A  Patient Will Follow Care Advice:  YES

## 2012-11-25 NOTE — Telephone Encounter (Signed)
Okay to add on at 4:45PM 

## 2012-11-26 ENCOUNTER — Telehealth: Payer: Self-pay | Admitting: Internal Medicine

## 2012-11-26 NOTE — Telephone Encounter (Signed)
Patient Information:  Caller Name: Honorio  Phone: [phone]  Patient: [name], [name]  Gender: Male  DOB: July 21, 1941  Age: 72 Years  PCP: Tillman Abide [facility])  Office Follow Up:  Does the office need to follow up with this patient?: Yes  Instructions For The Office: Patient is concerned about increasing fluids to bring down GBS due to "fluid retention". Concerned about GBS #300 and weekend coming. Increase insulin? Guidance? Reviewed home care . Patient would like Guidance by physician. Pleaes follow up concerning elevated GBS and prednisone.  RN Note:  Patient is concerned about increasing fluids to bring down GBS due to "fluid retention". Concerned about GBS #300 and weekend coming. Increase insulin? Guidance? Reviewed home care . Patient would like Guidance by physician. Pleaes follow up concerning elevated GBS and prednisone.  Symptoms  Reason For Call & Symptoms: Patient is Diabetic. He began having issues last night- GBS 140.  This morning , GBS was #210 .He took his Januvia and Levamir as directed.  For breakfast , had 1 egg white sandwich with light mayo, glass of Diet [place] dry/ 2 cup of coffee., 1 glass of water. At dinner today, small amount of sphaghetti and Blood sugar was #310.  He has nothing else to eat .  Patient states he was given two different medications for recent congestion and wheezing- Prednisone.  Reviewed Health History In EMR: Yes  Reviewed Medications In EMR: Yes  Reviewed Allergies In EMR: Yes  Reviewed Surgeries / Procedures: Yes  Date of Onset of Symptoms: 11/25/2012  Treatments Tried: Patient is on Prednisone and loratadine  Treatments Tried Worked: No  Guideline(s) Used:  Diabetes - High Blood Sugar  Disposition Per Guideline:   Discuss with PCP and Callback by Nurse Today  Reason For Disposition Reached:   Caller has NON-URGENT medication question about med that PCP prescribed and triager unable to answer question  Advice  Given:  Treatment - Liquids  Drink at least one glass (8 oz or 240 ml) of water per hour for the next 4 hours. (Reason: adequate hydration will reduce hyperglycemia).  Generally, you should try to drink 6-8 glasses of water each day.  Treatment - Insulin  Continue to take your insulin, as prescribed by your doctor.  Sliding Scale Insulin: IF your doctor has given you instructions to take extra rapid-acting (e.g., lispro, aspart) or short acting (regular) insulin when your blood sugar is high, give yourself the insulin dose your doctor has recommended.  Call Back If:  Blood glucose more than 300 mg/dL (09.8 mmol/l), 2 or more times in a row.  Vomiting lasting more than 4 hours or unable to drink any liquids.  Rapid breathing occurs  You become worse.  Patient Will Follow Care Advice:  YES

## 2012-11-26 NOTE — Telephone Encounter (Signed)
Discussed that he should only take 20mg  of prednisone a day Needs to drink lots of water but stay away from salt  If goes over 400, stop the prednisone  No change in insulin now since on levemir

## 2012-11-27 ENCOUNTER — Emergency Department (HOSPITAL_COMMUNITY): Payer: Medicare Other

## 2012-11-27 ENCOUNTER — Encounter (HOSPITAL_COMMUNITY): Payer: Self-pay | Admitting: Emergency Medicine

## 2012-11-27 ENCOUNTER — Other Ambulatory Visit: Payer: Self-pay

## 2012-11-27 ENCOUNTER — Inpatient Hospital Stay (HOSPITAL_COMMUNITY)
Admission: EM | Admit: 2012-11-27 | Discharge: 2012-11-30 | DRG: 190 | Disposition: A | Discharge status: Home / Self Care | Payer: Medicare Other | Attending: Internal Medicine | Admitting: Internal Medicine

## 2012-11-27 DIAGNOSIS — Z0000 Encounter for general adult medical examination without abnormal findings: Secondary | ICD-10-CM

## 2012-11-27 DIAGNOSIS — Z87891 Personal history of nicotine dependence: Secondary | ICD-10-CM

## 2012-11-27 DIAGNOSIS — Z9229 Personal history of other drug therapy: Secondary | ICD-10-CM

## 2012-11-27 DIAGNOSIS — I480 Paroxysmal atrial fibrillation: Secondary | ICD-10-CM | POA: Diagnosis present

## 2012-11-27 DIAGNOSIS — E1129 Type 2 diabetes mellitus with other diabetic kidney complication: Secondary | ICD-10-CM | POA: Diagnosis present

## 2012-11-27 DIAGNOSIS — G459 Transient cerebral ischemic attack, unspecified: Secondary | ICD-10-CM

## 2012-11-27 DIAGNOSIS — E1159 Type 2 diabetes mellitus with other circulatory complications: Secondary | ICD-10-CM | POA: Diagnosis present

## 2012-11-27 DIAGNOSIS — I2510 Atherosclerotic heart disease of native coronary artery without angina pectoris: Secondary | ICD-10-CM | POA: Diagnosis present

## 2012-11-27 DIAGNOSIS — L219 Seborrheic dermatitis, unspecified: Secondary | ICD-10-CM

## 2012-11-27 DIAGNOSIS — I10 Essential (primary) hypertension: Secondary | ICD-10-CM | POA: Diagnosis present

## 2012-11-27 DIAGNOSIS — I428 Other cardiomyopathies: Secondary | ICD-10-CM | POA: Diagnosis present

## 2012-11-27 DIAGNOSIS — J40 Bronchitis, not specified as acute or chronic: Secondary | ICD-10-CM

## 2012-11-27 DIAGNOSIS — E1165 Type 2 diabetes mellitus with hyperglycemia: Secondary | ICD-10-CM | POA: Diagnosis present

## 2012-11-27 DIAGNOSIS — N058 Unspecified nephritic syndrome with other morphologic changes: Secondary | ICD-10-CM | POA: Diagnosis present

## 2012-11-27 DIAGNOSIS — R609 Edema, unspecified: Secondary | ICD-10-CM

## 2012-11-27 DIAGNOSIS — I872 Venous insufficiency (chronic) (peripheral): Secondary | ICD-10-CM | POA: Diagnosis present

## 2012-11-27 DIAGNOSIS — J209 Acute bronchitis, unspecified: Principal | ICD-10-CM | POA: Diagnosis present

## 2012-11-27 DIAGNOSIS — Z951 Presence of aortocoronary bypass graft: Secondary | ICD-10-CM

## 2012-11-27 DIAGNOSIS — I4891 Unspecified atrial fibrillation: Secondary | ICD-10-CM | POA: Diagnosis present

## 2012-11-27 DIAGNOSIS — I429 Cardiomyopathy, unspecified: Secondary | ICD-10-CM

## 2012-11-27 DIAGNOSIS — R9431 Abnormal electrocardiogram [ECG] [EKG]: Secondary | ICD-10-CM

## 2012-11-27 DIAGNOSIS — R0982 Postnasal drip: Secondary | ICD-10-CM | POA: Diagnosis present

## 2012-11-27 DIAGNOSIS — J438 Other emphysema: Secondary | ICD-10-CM

## 2012-11-27 DIAGNOSIS — J440 Chronic obstructive pulmonary disease with acute lower respiratory infection: Principal | ICD-10-CM | POA: Diagnosis present

## 2012-11-27 DIAGNOSIS — G479 Sleep disorder, unspecified: Secondary | ICD-10-CM

## 2012-11-27 DIAGNOSIS — I5023 Acute on chronic systolic (congestive) heart failure: Secondary | ICD-10-CM | POA: Diagnosis present

## 2012-11-27 DIAGNOSIS — E785 Hyperlipidemia, unspecified: Secondary | ICD-10-CM | POA: Diagnosis present

## 2012-11-27 DIAGNOSIS — Z8673 Personal history of transient ischemic attack (TIA), and cerebral infarction without residual deficits: Secondary | ICD-10-CM

## 2012-11-27 DIAGNOSIS — Z7901 Long term (current) use of anticoagulants: Secondary | ICD-10-CM

## 2012-11-27 DIAGNOSIS — R160 Hepatomegaly, not elsewhere classified: Secondary | ICD-10-CM

## 2012-11-27 DIAGNOSIS — Z79899 Other long term (current) drug therapy: Secondary | ICD-10-CM

## 2012-11-27 DIAGNOSIS — I5022 Chronic systolic (congestive) heart failure: Secondary | ICD-10-CM

## 2012-11-27 DIAGNOSIS — N400 Enlarged prostate without lower urinary tract symptoms: Secondary | ICD-10-CM | POA: Diagnosis present

## 2012-11-27 DIAGNOSIS — G589 Mononeuropathy, unspecified: Secondary | ICD-10-CM

## 2012-11-27 DIAGNOSIS — E119 Type 2 diabetes mellitus without complications: Secondary | ICD-10-CM

## 2012-11-27 DIAGNOSIS — I509 Heart failure, unspecified: Secondary | ICD-10-CM | POA: Diagnosis present

## 2012-11-27 DIAGNOSIS — I252 Old myocardial infarction: Secondary | ICD-10-CM

## 2012-11-27 LAB — BASIC METABOLIC PANEL
Calcium: 8.9 mg/dL (ref 8.4–10.5)
Chloride: 103 mEq/L (ref 96–112)
Creatinine, Ser: 1.27 mg/dL (ref 0.50–1.35)
GFR calc Af Amer: 64 mL/min — ABNORMAL LOW (ref >90)
Sodium: 138 mEq/L (ref 135–145)

## 2012-11-27 LAB — CBC WITH DIFFERENTIAL/PLATELET
Basophils Absolute: 0 10*3/uL (ref 0.0–0.1)
Basophils Relative: 1 % (ref 0–1)
HCT: 37.6 % — ABNORMAL LOW (ref 39.0–52.0)
MCHC: 33.8 g/dL (ref 30.0–36.0)
Monocytes Absolute: 0.6 10*3/uL (ref 0.1–1.0)
Neutro Abs: 3.4 10*3/uL (ref 1.7–7.7)
Platelets: 153 10*3/uL (ref 150–400)
RDW: 15.7 % — ABNORMAL HIGH (ref 11.5–15.5)
WBC: 4.7 10*3/uL (ref 4.0–10.5)

## 2012-11-27 LAB — PRO B NATRIURETIC PEPTIDE: Pro B Natriuretic peptide (BNP): 1210 pg/mL — ABNORMAL HIGH (ref 0–125)

## 2012-11-27 LAB — TROPONIN I
Troponin I: <0.3 ng/mL (ref <0.30)
Troponin I: <0.3 ng/mL (ref <0.30)

## 2012-11-27 LAB — PROTIME-INR: INR: 2.14 — ABNORMAL HIGH (ref 0.00–1.49)

## 2012-11-27 MED ORDER — ISOSORBIDE MONONITRATE ER 30 MG PO TB24
30.0000 mg | ORAL_TABLET | Freq: Every day | ORAL | Status: DC
  Administered 2012-11-27 – 2012-11-29 (×3): 30 mg via ORAL
  Filled 2012-11-27 (×4): qty 1

## 2012-11-27 MED ORDER — LORATADINE 10 MG PO TABS
10.0000 mg | ORAL_TABLET | Freq: Two times a day (BID) | ORAL | Status: DC | PRN
  Filled 2012-11-27: qty 1

## 2012-11-27 MED ORDER — SODIUM CHLORIDE 0.9 % IV SOLN: 250.0000 mL | INTRAVENOUS | Status: DC | PRN

## 2012-11-27 MED ORDER — BENZONATATE 100 MG PO CAPS
200.0000 mg | ORAL_CAPSULE | Freq: Three times a day (TID) | ORAL | Status: DC | PRN
  Filled 2012-11-27: qty 2

## 2012-11-27 MED ORDER — ALBUTEROL SULFATE (5 MG/ML) 0.5% IN NEBU
2.5000 mg | INHALATION_SOLUTION | Freq: Four times a day (QID) | RESPIRATORY_TRACT | Status: DC
  Administered 2012-11-27 – 2012-11-29 (×9): 2.5 mg via RESPIRATORY_TRACT
  Filled 2012-11-27 (×9): qty 0.5

## 2012-11-27 MED ORDER — GUAIFENESIN-CODEINE 100-10 MG/5ML PO SOLN: 5.0000 mL | ORAL | Status: DC | PRN

## 2012-11-27 MED ORDER — INSULIN DETEMIR 100 UNIT/ML ~~LOC~~ SOLN
20.0000 [IU] | Freq: Every day | SUBCUTANEOUS | Status: DC
  Administered 2012-11-28 – 2012-11-30 (×3): 20 [IU] via SUBCUTANEOUS
  Filled 2012-11-27 (×4): qty 0.2

## 2012-11-27 MED ORDER — SODIUM CHLORIDE 0.9 % IJ SOLN: 3.0000 mL | INTRAMUSCULAR | Status: DC | PRN

## 2012-11-27 MED ORDER — AMIODARONE HCL 200 MG PO TABS
200.0000 mg | ORAL_TABLET | Freq: Every day | ORAL | Status: DC
  Administered 2012-11-28 – 2012-11-30 (×3): 200 mg via ORAL
  Filled 2012-11-27 (×3): qty 1

## 2012-11-27 MED ORDER — ATORVASTATIN CALCIUM 20 MG PO TABS
20.0000 mg | ORAL_TABLET | Freq: Every day | ORAL | Status: DC
  Administered 2012-11-27 – 2012-11-29 (×3): 20 mg via ORAL
  Filled 2012-11-27 (×4): qty 1

## 2012-11-27 MED ORDER — LORAZEPAM 0.5 MG PO TABS: 0.5000 mg | ORAL_TABLET | Freq: Every evening | ORAL | Status: DC | PRN

## 2012-11-27 MED ORDER — CARVEDILOL 6.25 MG PO TABS
6.2500 mg | ORAL_TABLET | Freq: Two times a day (BID) | ORAL | Status: DC
  Administered 2012-11-28 – 2012-11-30 (×6): 6.25 mg via ORAL
  Filled 2012-11-27 (×7): qty 1

## 2012-11-27 MED ORDER — PREDNISONE 10 MG PO TABS
10.0000 mg | ORAL_TABLET | Freq: Every day | ORAL | Status: AC
  Administered 2012-11-28 – 2012-11-30 (×3): 10 mg via ORAL
  Filled 2012-11-27 (×3): qty 1

## 2012-11-27 MED ORDER — SODIUM CHLORIDE 0.9 % IJ SOLN: 3.0000 mL | Freq: Two times a day (BID) | INTRAMUSCULAR | Status: DC

## 2012-11-27 MED ORDER — ONDANSETRON HCL 4 MG/2ML IJ SOLN: 4.0000 mg | Freq: Four times a day (QID) | INTRAMUSCULAR | Status: DC | PRN

## 2012-11-27 MED ORDER — POTASSIUM CHLORIDE ER 10 MEQ PO TBCR
10.0000 meq | EXTENDED_RELEASE_TABLET | Freq: Two times a day (BID) | ORAL | Status: DC
  Administered 2012-11-27 – 2012-11-30 (×6): 10 meq via ORAL
  Filled 2012-11-27 (×7): qty 1

## 2012-11-27 MED ORDER — WARFARIN - PHARMACIST DOSING INPATIENT: Freq: Every day | Status: DC

## 2012-11-27 MED ORDER — ACETAMINOPHEN 325 MG PO TABS: 650.0000 mg | ORAL_TABLET | Freq: Four times a day (QID) | ORAL | Status: DC | PRN

## 2012-11-27 MED ORDER — LINAGLIPTIN 5 MG PO TABS
5.0000 mg | ORAL_TABLET | Freq: Every day | ORAL | Status: DC
  Administered 2012-11-28 – 2012-11-30 (×3): 5 mg via ORAL
  Filled 2012-11-27 (×3): qty 1

## 2012-11-27 MED ORDER — FUROSEMIDE 10 MG/ML IJ SOLN
40.0000 mg | Freq: Two times a day (BID) | INTRAMUSCULAR | Status: DC
  Administered 2012-11-27 – 2012-11-28 (×2): 40 mg via INTRAVENOUS
  Filled 2012-11-27 (×3): qty 4

## 2012-11-27 MED ORDER — SENNA 8.6 MG PO TABS
1.0000 | ORAL_TABLET | Freq: Two times a day (BID) | ORAL | Status: DC
  Administered 2012-11-27 – 2012-11-30 (×5): 8.6 mg via ORAL
  Filled 2012-11-27 (×7): qty 1

## 2012-11-27 MED ORDER — INSULIN ASPART 100 UNIT/ML ~~LOC~~ SOLN
0.0000 [IU] | Freq: Three times a day (TID) | SUBCUTANEOUS | Status: DC
  Administered 2012-11-29: 8 [IU] via SUBCUTANEOUS
  Administered 2012-11-29: 3 [IU] via SUBCUTANEOUS
  Administered 2012-11-29: 5 [IU] via SUBCUTANEOUS
  Administered 2012-11-30: 3 [IU] via SUBCUTANEOUS

## 2012-11-27 MED ORDER — DOCUSATE SODIUM 100 MG PO CAPS
100.0000 mg | ORAL_CAPSULE | Freq: Every day | ORAL | Status: DC
  Administered 2012-11-28 – 2012-11-30 (×3): 100 mg via ORAL
  Filled 2012-11-27 (×3): qty 1

## 2012-11-27 MED ORDER — ACETAMINOPHEN 650 MG RE SUPP: 650.0000 mg | Freq: Four times a day (QID) | RECTAL | Status: DC | PRN

## 2012-11-27 MED ORDER — LISINOPRIL 10 MG PO TABS
10.0000 mg | ORAL_TABLET | Freq: Every day | ORAL | Status: DC
  Administered 2012-11-27 – 2012-11-30 (×4): 10 mg via ORAL
  Filled 2012-11-27 (×4): qty 1

## 2012-11-27 MED ORDER — ASPIRIN EC 81 MG PO TBEC
81.0000 mg | DELAYED_RELEASE_TABLET | Freq: Every day | ORAL | Status: DC
  Administered 2012-11-28 – 2012-11-30 (×3): 81 mg via ORAL
  Filled 2012-11-27 (×3): qty 1

## 2012-11-27 MED ORDER — SODIUM CHLORIDE 0.9 % IJ SOLN
3.0000 mL | Freq: Two times a day (BID) | INTRAMUSCULAR | Status: DC
  Administered 2012-11-27 – 2012-11-30 (×6): 3 mL via INTRAVENOUS

## 2012-11-27 MED ORDER — WARFARIN SODIUM 5 MG PO TABS: 5.0000 mg | ORAL_TABLET | Freq: Every evening | ORAL | Status: DC

## 2012-11-27 MED ORDER — WARFARIN SODIUM 5 MG PO TABS
5.0000 mg | ORAL_TABLET | Freq: Once | ORAL | Status: AC
  Administered 2012-11-27: 5 mg via ORAL
  Filled 2012-11-27: qty 1

## 2012-11-27 MED ORDER — ONDANSETRON HCL 4 MG PO TABS: 4.0000 mg | ORAL_TABLET | Freq: Four times a day (QID) | ORAL | Status: DC | PRN

## 2012-11-27 NOTE — ED Notes (Addendum)
Documented on wrong pt

## 2012-11-27 NOTE — ED Notes (Signed)
Pt from home c/o confusion. Pt states he called a Therapist, art because he got his medications mixed up. Nurse call EMS due to confusion and cough. Pt denies LOC, is alert and oriented.

## 2012-11-27 NOTE — ED Provider Notes (Signed)
History     CSN: [id number]  Arrival date & time 11/27/12  1326   First MD Initiated Contact with Patient 11/27/12 1353      Chief Complaint  Patient presents with  . Altered Mental Status    (Consider location/radiation/quality/duration/timing/severity/associated sxs/prior treatment) Patient is a 72 y.o. male presenting with altered mental status.  Altered Mental Status   Pt with history of multiple medical problems was at PCP office 2 days ago for nasal drip and cough/SOB worse at night. He was started on prednisone and loratadine. He reports he thinks he accidentally got his prednisone and his amiodarone pills mixed up yesterday because they look similar. He is unsure if he took too much of one or the other. He called the PCP office yesterday with concerns about elevated sugar and was told to drink plenty of fluids while on Prednisone and to come to the ED if it got higher than 400. Sugar has been well controlled, but he has felt more SOB today with continued coughing but no fever. There was some concern for confusion by nurse at PCP office and she called EMS.  Past Medical History  Diagnosis Date  . Atrial fibrillation      S/P ablation  . CAD (coronary artery disease)     S/P MI 1980's----------------------Dr McDowell  . Hyperlipidemia   . CHF (congestive heart failure)   . 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 BPH (benign prostatic hypertrophy)   . Stroke   . Sleep disturbance     Past Surgical History  Procedure Laterality Date  . Coronary artery bypass graft    . Appendectomy    . Tonsillectomy    . Upper gastrointestinal endoscopy      Gastritis , ? H. pylori 08/28/1999  . Atrial ablation surgery      History reviewed. No pertinent family history.  History  Substance Use Topics  . Smoking status: Former Smoker -- 1.00 packs/day    Types: Cigarettes    Quit date: 07/15/1973  . Smokeless tobacco: Never Used  . Alcohol Use:  No      Review of Systems  Psychiatric/Behavioral: Positive for altered mental status.   All other systems reviewed and are negative except as noted in HPI.   Allergies  Review of patient's allergies indicates no known allergies.  Home Medications   Current Outpatient Rx  Name  Route  Sig  Dispense  Refill  . amiodarone (PACERONE) 200 MG tablet   Oral   Take 200 mg by mouth daily.         Marland Kitchen aspirin EC 81 MG tablet   Oral   Take 81 mg by mouth daily.         Marland Kitchen atorvastatin (LIPITOR) 20 MG tablet   Oral   Take 20 mg by mouth at bedtime.          . carvedilol (COREG) 6.25 MG tablet   Oral   Take 1 tablet (6.25 mg total) by mouth 2 (two) times daily with a meal.   60 tablet   5   . cholecalciferol (VITAMIN D) 1000 UNITS tablet   Oral   Take 1,000 Units by mouth daily.         Tery Sanfilippo Calcium (STOOL SOFTENER PO)   Oral   Take 1 capsule by mouth 2 (two) times daily.         . insulin detemir (LEVEMIR) 100 UNIT/ML injection   Subcutaneous  Inject 0.2 mLs (20 Units total) into the skin daily before breakfast.   20 mL   1   . isosorbide mononitrate (IMDUR) 30 MG 24 hr tablet   Oral   Take 30 mg by mouth at bedtime.         Marland Kitchen loratadine (CLARITIN) 10 MG tablet   Oral   Take 10 mg by mouth 2 (two) times daily as needed for allergies.         Marland Kitchen LORazepam (ATIVAN) 0.5 MG tablet   Oral   Take 0.5 mg by mouth at bedtime as needed (for sleep).          . Multiple Vitamin (MULTIVITAMIN WITH MINERALS) TABS   Oral   Take 1 tablet by mouth daily.         . Niacin (VITAMIN B-3 PO)   Oral   Take 1 tablet by mouth daily.         . Omega-3 Fatty Acids (FISH OIL) 1000 MG CAPS   Oral   Take 1,000 mg by mouth daily.         . potassium chloride (K-DUR) 10 MEQ tablet   Oral   Take 1 tablet (10 mEq total) by mouth 2 (two) times daily.   60 tablet   6   . predniSONE (DELTASONE) 20 MG tablet   Oral   Take 20-40 mg by mouth daily. Tapered  dose filled 11/25/2012:  Take 2 tablets (40 mg) daily for 3 days, then take 1 tablet (20 mg) daily for 3 days         . quinapril (ACCUPRIL) 10 MG tablet   Oral   Take 10 mg by mouth at bedtime.         . sitaGLIPtin (JANUVIA) 100 MG tablet   Oral   Take 1 tablet (100 mg total) by mouth daily.   90 tablet   1   . torsemide (DEMADEX) 20 MG tablet   Oral   Take 20-40 mg by mouth 2 (two) times daily. 1-2 tablets - depending on amount of fluid retention         . triamcinolone (KENALOG) 0.1 % cream   Topical   Apply 1 application topically 2 (two) times daily as needed (for scaling (eczema) under eyes).          . vitamin E 400 UNIT capsule   Oral   Take 400 Units by mouth daily.         Marland Kitchen warfarin (COUMADIN) 5 MG tablet   Oral   Take 5-7.5 mg by mouth every evening. Take 1 1/2 tablets (7.5 mg) on Monday nights, take 1 tablet (5 mg) on all other days         . glucose blood (RELION PRIME TEST) test strip      Test blood sugar 3-5 times daily, dx: 250.00   200 each   11   . Insulin Syringe-Needle U-100 (BD INSULIN SYRINGE ULTRAFINE) 31G X 5/16" 1 ML MISC      USE AS DIRECTED   100 each   11     BP 102/50  Pulse 66  Temp(Src) 98.1 F (36.7 C)  Resp 15  SpO2 98%  Physical Exam  Nursing note and vitals reviewed. Constitutional: He is oriented to person, place, and time. He appears well-developed and well-nourished.  HENT:  Head: Normocephalic and atraumatic.  Eyes: EOM are normal. Pupils are equal, round, and reactive to light.  Neck: Normal range of motion. Neck  supple.  Cardiovascular: Normal rate, normal heart sounds and intact distal pulses.   Pulmonary/Chest: Effort normal. No respiratory distress. He has no wheezes. He has rales.  Abdominal: Bowel sounds are normal. He exhibits no distension. There is no tenderness.  Musculoskeletal: Normal range of motion. He exhibits no edema and no tenderness.  Neurological: He is alert and oriented to person,  place, and time. He has normal strength. No cranial nerve deficit or sensory deficit.  Skin: Skin is warm and dry. No rash noted.  Psychiatric: He has a normal mood and affect.    ED Course  Procedures (including critical care time)  Labs Reviewed  CBC WITH DIFFERENTIAL - Abnormal; Notable for the following:    Hemoglobin 12.7 (*)    HCT 37.6 (*)    RDW 15.7 (*)    Lymphocytes Relative 11 (*)    Lymphs Abs 0.5 (*)    Monocytes Relative 13 (*)    All other components within normal limits  BASIC METABOLIC PANEL - Abnormal; Notable for the following:    Glucose, Bld 144 (*)    BUN 32 (*)    GFR calc non Af Amer 56 (*)    GFR calc Af Amer 64 (*)    All other components within normal limits  PRO B NATRIURETIC PEPTIDE - Abnormal; Notable for the following:    Pro B Natriuretic peptide (BNP) 1210.0 (*)    All other components within normal limits  TROPONIN I   Dg Chest 2 View  11/27/2012  *RADIOLOGY REPORT*  Clinical Data: Shortness of breath and cough.  CHEST - 2 VIEW  Comparison: None  Findings: Cardiomegaly and CABG changes noted. COPD/emphysema noted. There is no evidence of focal airspace disease, pulmonary edema, suspicious pulmonary nodule/mass, pleural effusion, or pneumothorax. No acute bony abnormalities are identified.  IMPRESSION: No evidence of acute cardiopulmonary disease.  Cardiomegaly and COPD/emphysema.   Original Report Authenticated By: Harmon Pier, M.D.      1. CHF (congestive heart failure)       MDM  CXR clear but BNP elevated above baseline. Suspect mild fluid overload, however BP is borderline. Pt states this is about his baseline but will admit for gentle diuresis. Discussed with Hospitalist.    Date: 11/27/2012  Rate: 66  Rhythm: normal sinus rhythm  QRS Axis: left  Intervals: normal  ST/T Wave abnormalities: nonspecific T wave changes  Conduction Disutrbances:nonspecific intraventricular conduction delay  Narrative Interpretation:   Old EKG  Reviewed: unchanged        Charles B. Bernette Mayers, MD 11/27/12 1558

## 2012-11-27 NOTE — Progress Notes (Signed)
ANTICOAGULATION CONSULT NOTE - Initial Consult  Pharmacy Consult for warfarin Indication: atrial fibrillation  No Known Allergies  Patient Measurements:    Vital Signs: Temp: 98.1 F (36.7 C) (04/26 1337) BP: 93/45 mmHg (04/26 1753) Pulse Rate: 70 (04/26 1753)  Labs:  Recent Labs  11/25/12 1603 11/27/12 1405 11/27/12 1553  HGB  --  12.7*  --   HCT  --  37.6*  --   PLT  --  153  --   LABPROT  --   --  23.0*  INR 2.5  --  2.14*  CREATININE  --  1.27  --   TROPONINI  --  <0.30  --     The CrCl is unknown because both a height and weight (above a minimum accepted value) are required for this calculation.   Medical History: Past Medical History  Diagnosis Date  . Atrial fibrillation      S/P ablation  . CAD (coronary artery disease)     S/P MI 1980's----------------------Dr McDowell  . Hyperlipidemia   . CHF (congestive heart failure)   . 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 BPH (benign prostatic hypertrophy)   . Stroke   . Sleep disturbance     Medications:  Prescriptions prior to admission  Medication Sig Dispense Refill  . amiodarone (PACERONE) 200 MG tablet Take 200 mg by mouth daily.      Marland Kitchen aspirin EC 81 MG tablet Take 81 mg by mouth daily.      Marland Kitchen atorvastatin (LIPITOR) 20 MG tablet Take 20 mg by mouth at bedtime.       . carvedilol (COREG) 6.25 MG tablet Take 1 tablet (6.25 mg total) by mouth 2 (two) times daily with a meal.  60 tablet  5  . cholecalciferol (VITAMIN D) 1000 UNITS tablet Take 1,000 Units by mouth daily.      Tery Sanfilippo Calcium (STOOL SOFTENER PO) Take 1 capsule by mouth 2 (two) times daily.      . insulin detemir (LEVEMIR) 100 UNIT/ML injection Inject 0.2 mLs (20 Units total) into the skin daily before breakfast.  20 mL  1  . isosorbide mononitrate (IMDUR) 30 MG 24 hr tablet Take 30 mg by mouth at bedtime.      Marland Kitchen loratadine (CLARITIN) 10 MG tablet Take 10 mg by mouth 2 (two) times daily as needed for  allergies.      Marland Kitchen LORazepam (ATIVAN) 0.5 MG tablet Take 0.5 mg by mouth at bedtime as needed (for sleep).       . Multiple Vitamin (MULTIVITAMIN WITH MINERALS) TABS Take 1 tablet by mouth daily.      . Niacin (VITAMIN B-3 PO) Take 1 tablet by mouth daily.      . Omega-3 Fatty Acids (FISH OIL) 1000 MG CAPS Take 1,000 mg by mouth daily.      . potassium chloride (K-DUR) 10 MEQ tablet Take 1 tablet (10 mEq total) by mouth 2 (two) times daily.  60 tablet  6  . predniSONE (DELTASONE) 20 MG tablet Take 20-40 mg by mouth daily. Tapered dose filled 11/25/2012:  Take 2 tablets (40 mg) daily for 3 days, then take 1 tablet (20 mg) daily for 3 days      . quinapril (ACCUPRIL) 10 MG tablet Take 10 mg by mouth at bedtime.      . sitaGLIPtin (JANUVIA) 100 MG tablet Take 1 tablet (100 mg total) by mouth daily.  90 tablet  1  .  torsemide (DEMADEX) 20 MG tablet Take 20-40 mg by mouth 2 (two) times daily. 1-2 tablets - depending on amount of fluid retention      . triamcinolone (KENALOG) 0.1 % cream Apply 1 application topically 2 (two) times daily as needed (for scaling (eczema) under eyes).       . vitamin E 400 UNIT capsule Take 400 Units by mouth daily.      Marland Kitchen warfarin (COUMADIN) 5 MG tablet Take 5-7.5 mg by mouth every evening. Take 1 1/2 tablets (7.5 mg) on Monday nights, take 1 tablet (5 mg) on all other days      . glucose blood (RELION PRIME TEST) test strip Test blood sugar 3-5 times daily, dx: 250.00  200 each  11  . Insulin Syringe-Needle U-100 (BD INSULIN SYRINGE ULTRAFINE) 31G X 5/16" 1 ML MISC USE AS DIRECTED  100 each  11    Assessment: 71 yom on chronic coumadin for afib presented with history of cough and SOB. INR is therapeutic at 2.14. No bleeding noted, H/H 12.7/37.6, plts 153.   Goal of Therapy:  INR 2-3   Plan:  1. Coumadin 5mg  PO x 1 tonight 2. Daily INR  Briyah Wheelwright, Drake Leach 11/27/2012,6:24 PM

## 2012-11-27 NOTE — H&P (Signed)
[facility] History and Physical   Subjective  ubjective:   Patient is a 72 y.o. male presents with with a couple days history of cough, SOB and sputum production along with clear eye drainage and sinus congestion.  He presented to his PCP who noted that he may have had an aspiration event and treated him for post nasal drip with a short course of prednisone and loratadine.  Mr. Armbrust notes that the new medications he received confused him and he may have mixed up some of his pills, so he is not sure how much of these medications he took.  Because of the prednisone, he did call in to his physicians office concerned for rising blood sugars.  He was requested to continue the prednisone but to make sure to drink lots of water.  Since that time, he has noted that his SOB has increased, particularly with talking.  He continues to have the cough, sinus pressure, whitish sputum production, subjective fevers. He is unsure if his breathing is worse with walking.  He did wake up breathing hard one time (? PND).  He denies worsening orthopnea, new chest pain, palpations, worsening leg swelling, weight gain, decreased UOP.    Problem  Bronchitis  Post-Nasal Drip  Chronic Systolic Chf (Congestive Heart Failure)  Htn (Hypertension)  Type 2 Diabetes Mellitus  Atrial Fibrillation  EMPHYSEMA    Past Medical History  Diagnosis Date  . Atrial fibrillation      S/P ablation  . CAD (coronary artery disease)     S/P MI 1980's----------------------Dr McDowell  . Hyperlipidemia   . CHF (congestive heart failure)   . 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 BPH (benign prostatic hypertrophy)   . Stroke   . Sleep disturbance     Past Surgical History  Procedure Laterality Date  . Coronary artery bypass graft    . Appendectomy    . Tonsillectomy    . Upper gastrointestinal endoscopy      Gastritis , ? H. pylori 08/28/1999  . Atrial ablation surgery      Meds  ordered this encounter  Medications  . atorvastatin (LIPITOR) 20 MG tablet    Sig: Take 20 mg by mouth at bedtime.   Marland Kitchen LORazepam (ATIVAN) 0.5 MG tablet    Sig: Take 0.5 mg by mouth at bedtime as needed (for sleep).   . predniSONE (DELTASONE) 20 MG tablet    Sig: Take 20-40 mg by mouth daily. Tapered dose filled 11/25/2012:  Take 2 tablets (40 mg) daily for 3 days, then take 1 tablet (20 mg) daily for 3 days  . amiodarone (PACERONE) 200 MG tablet    Sig: Take 200 mg by mouth daily.  Marland Kitchen aspirin EC 81 MG tablet    Sig: Take 81 mg by mouth daily.  Marland Kitchen warfarin (COUMADIN) 5 MG tablet    Sig: Take 5-7.5 mg by mouth every evening. Take 1 1/2 tablets (7.5 mg) on Monday nights, take 1 tablet (5 mg) on all other days  . Multiple Vitamin (MULTIVITAMIN WITH MINERALS) TABS    Sig: Take 1 tablet by mouth daily.  . quinapril (ACCUPRIL) 10 MG tablet    Sig: Take 10 mg by mouth at bedtime.  . isosorbide mononitrate (IMDUR) 30 MG 24 hr tablet    Sig: Take 30 mg by mouth at bedtime.  Tery Sanfilippo Calcium (STOOL SOFTENER PO)    Sig: Take 1 capsule by mouth 2 (two) times daily.  Marland Kitchen  torsemide (DEMADEX) 20 MG tablet    Sig: Take 20-40 mg by mouth 2 (two) times daily. 1-2 tablets - depending on amount of fluid retention  . Niacin (VITAMIN B-3 PO)    Sig: Take 1 tablet by mouth daily.  Marland Kitchen loratadine (CLARITIN) 10 MG tablet    Sig: Take 10 mg by mouth 2 (two) times daily as needed for allergies.    No Known Allergies  History  Substance Use Topics  . Smoking status: Former Smoker -- 1.00 packs/day    Types: Cigarettes    Quit date: 07/15/1973  . Smokeless tobacco: Never Used  . Alcohol Use: No    History reviewed. No pertinent family history.  Review of Systems Constitutional: positive for fevers, negative for chills, malaise and night sweats Eyes: negative for irritation, redness and visual disturbance Ears, nose, mouth, throat, and face: positive for nasal congestion and clear eye drainage, negative for  epistaxis and sore throat Respiratory: positive for cough, dyspnea on exertion, emphysema, sputum and wheezing, negative for hemoptysis and pneumonia Cardiovascular: positive for dyspnea, lower extremity edema and paroxysmal nocturnal dyspnea, negative for chest pain, chest pressure/discomfort, near-syncope and palpitations Gastrointestinal: positive for nausea, negative for abdominal pain, change in bowel habits, diarrhea and vomiting Genitourinary:negative for decreased stream, dysuria and frequency Musculoskeletal:negative Neurological: negative Endocrine: negative  Objective:   Patient Vitals for the past 8 hrs:  BP Temp Pulse Resp SpO2  11/27/12 1630 104/54 mmHg - 70 23 95 %  11/27/12 1615 106/57 mmHg - 66 20 95 %  11/27/12 1600 90/50 mmHg - 68 19 95 %  11/27/12 1550 100/60 mmHg - - - -  11/27/12 1545 - - 67 16 94 %  11/27/12 1530 98/46 mmHg - 67 22 94 %  11/27/12 1337 102/50 mmHg 98.1 F (36.7 C) 66 15 98 %          BP 104/54  Pulse 70  Temp(Src) 98.1 F (36.7 C)  Resp 23  SpO2 95% General appearance: alert, cooperative, appears stated age and no distress Head: Normocephalic, without obvious abnormality, atraumatic Eyes: EOMI, + clear drainage to left eye, anicteric Lungs: clear to auscultation bilaterally and no wheezes, only mild crackles at bases, no wheezing, course upper respiratory sounds resonating throughout.  Heart: RR, NR, difficult to appreciate heart sounds 2/2 course upper respiratory sounds Abdomen: soft, NT, +BS Extremities: + venous stasis dermatitis, + healing wound on right anterior shin, 1+ edema bilaterally Pulses: 2+ and symmetric Skin: + wound to RLE  ECG: appears unchanged  Data Review CBC:  Lab Results  Component Value Date   WBC 4.7 11/27/2012   WBC 5.8 10/09/2011   RBC 4.40 11/27/2012   RBC 4.94 10/09/2011   BMP:  Lab Results  Component Value Date   GLUCOSE 144* 11/27/2012   GLUCOSE 74 10/09/2011   CO2 26 11/27/2012   BUN 32* 11/27/2012    BUN 22 10/09/2011   CREATININE 1.27 11/27/2012   CALCIUM 8.9 11/27/2012   Coagulation:  Lab Results  Component Value Date   INR 2.14* 11/27/2012   INR 2.5 11/25/2012   INR 2.8 10/20/2011   Cardiac markers:  TnI normal BNP 1210  Radiology review:   CXR:  IMPRESSION:  No evidence of acute cardiopulmonary disease.  Cardiomegaly and COPD/emphysema.    Assessment:   Principal Problem:   Bronchitis Active Problems:   Atrial fibrillation   EMPHYSEMA   Type 2 diabetes mellitus   HTN (hypertension)   Chronic systolic CHF (congestive heart failure)  Post-nasal drip   Plan:   1. Bronchitis -  - I think Mr. Soderquist main issue is a URI/Bronchitis - Will continue a short course of steroids, starting SSI for his elevated BS - Monitor for any fever, increase WBC (currently normal) - Nebulizer therapy q6 hours - Guaifenesin, codeine and tessalon perles - consider Abx if not improving  2. Chronic systolic heart failure, EF 25-30% - Call cardiology in the AM, [place] - Strict I/O - Fluid restriction 1500cc - Daily weight - Patient feels that he has actually lost weight and his LE edema is not worsened.  - CXR without edema - Renal function at baseline - Lasix 40mg  IV X 4 doses, restart home torsemide after - I think his symptoms are mostly related to his bronchitis as noted above - continue home regimen, coreg, aspirin, imdur, statin, quinapril  3. Atrial fibrillation - Continue home regimen of amiodarone, coreg - Coumadin per pharmacy  4. DM 2 - Continue home levemir, januvia - Monitor CBG while on prednisone - SSI  5. Insomnia Continue PO Lorazepam  Code: Full  DVT ppx - on coumadin  Signed   Fleetwood Pierron  For cross cover, call overnight on call [facility].

## 2012-11-28 DIAGNOSIS — E119 Type 2 diabetes mellitus without complications: Secondary | ICD-10-CM

## 2012-11-28 DIAGNOSIS — J209 Acute bronchitis, unspecified: Secondary | ICD-10-CM | POA: Diagnosis present

## 2012-11-28 DIAGNOSIS — I429 Cardiomyopathy, unspecified: Secondary | ICD-10-CM | POA: Diagnosis present

## 2012-11-28 DIAGNOSIS — J40 Bronchitis, not specified as acute or chronic: Secondary | ICD-10-CM

## 2012-11-28 DIAGNOSIS — J438 Other emphysema: Secondary | ICD-10-CM

## 2012-11-28 DIAGNOSIS — I4891 Unspecified atrial fibrillation: Secondary | ICD-10-CM

## 2012-11-28 DIAGNOSIS — Z9229 Personal history of other drug therapy: Secondary | ICD-10-CM

## 2012-11-28 DIAGNOSIS — I5022 Chronic systolic (congestive) heart failure: Secondary | ICD-10-CM

## 2012-11-28 DIAGNOSIS — I5023 Acute on chronic systolic (congestive) heart failure: Secondary | ICD-10-CM

## 2012-11-28 DIAGNOSIS — I10 Essential (primary) hypertension: Secondary | ICD-10-CM

## 2012-11-28 LAB — BASIC METABOLIC PANEL
BUN: 33 mg/dL — ABNORMAL HIGH (ref 6–23)
CO2: 29 mEq/L (ref 19–32)
Chloride: 102 mEq/L (ref 96–112)
Creatinine, Ser: 1.38 mg/dL — ABNORMAL HIGH (ref 0.50–1.35)

## 2012-11-28 LAB — GLUCOSE, CAPILLARY: Glucose-Capillary: 251 mg/dL — ABNORMAL HIGH (ref 70–99)

## 2012-11-28 LAB — CBC
Platelets: 143 10*3/uL — ABNORMAL LOW (ref 150–400)
RBC: 4.15 MIL/uL — ABNORMAL LOW (ref 4.22–5.81)
RDW: 15.9 % — ABNORMAL HIGH (ref 11.5–15.5)
WBC: 4 10*3/uL (ref 4.0–10.5)

## 2012-11-28 LAB — TROPONIN I: Troponin I: <0.3 ng/mL (ref <0.30)

## 2012-11-28 LAB — PROTIME-INR
INR: 2.15 — ABNORMAL HIGH (ref 0.00–1.49)
Prothrombin Time: 23.1 seconds — ABNORMAL HIGH (ref 11.6–15.2)

## 2012-11-28 MED ORDER — TORSEMIDE 20 MG PO TABS
20.0000 mg | ORAL_TABLET | Freq: Every day | ORAL | Status: DC
  Administered 2012-11-29 – 2012-11-30 (×2): 20 mg via ORAL
  Filled 2012-11-28 (×2): qty 1

## 2012-11-28 MED ORDER — LEVOFLOXACIN 500 MG PO TABS
500.0000 mg | ORAL_TABLET | Freq: Every day | ORAL | Status: DC
  Administered 2012-11-28 – 2012-11-30 (×3): 500 mg via ORAL
  Filled 2012-11-28 (×3): qty 1

## 2012-11-28 MED ORDER — BENZONATATE 100 MG PO CAPS
200.0000 mg | ORAL_CAPSULE | Freq: Three times a day (TID) | ORAL | Status: DC
  Administered 2012-11-28 – 2012-11-30 (×7): 200 mg via ORAL
  Filled 2012-11-28 (×9): qty 2

## 2012-11-28 MED ORDER — WARFARIN SODIUM 5 MG PO TABS
5.0000 mg | ORAL_TABLET | Freq: Once | ORAL | Status: AC
  Administered 2012-11-28: 5 mg via ORAL
  Filled 2012-11-28: qty 1

## 2012-11-28 NOTE — Progress Notes (Signed)
[facility] PROGRESS NOTE  [name] ZOX:[id number] DOB: 10-05-1940 DOA: 11/27/2012 PCP: Tillman Abide, MD  Assessment/Plan: Principal Problem:   Bronchitis Active Problems:   Atrial fibrillation   EMPHYSEMA   Type 2 diabetes mellitus   HTN (hypertension)   Chronic systolic CHF (congestive heart failure)   Post-nasal drip    1. Acute Bronchitis: Patient presented with couple days history of cough, SOB and sputum production along with clear eye drainage and sinus congestion. CXR is devoid of acute findings. Managing with a short course of steroids, bronchodilator nebulizers, as well as supportive treatment. Have commenced a 7-day course of Levaquin.  2. Chronic systolic heart failure, EF 25-30%: Patient denies worsening orthopnea, new chest pain, palpations, worsening leg swelling, weight gain. Clinically, CHF appears compensated and ProNBNP is only 1210. Agree with iv Lasix for now. Will invite [place] cardiology to participate in care. Continue home regimen, Coreg, Aspirin, Imdur, Statin, Quinapril. Cardiac enzymes are unelevated.  3. Atrial fibrillation: Rate-controlled on Amiodarone, Coreg. Coumadin per pharmacy  4. DM 2: At presentation, patient's CBC was reasonably controlled at 123, and has remained so. HBA1C is 6.4. On Levemir, Januvia and SSI.  5. Insomnia: Not problematic.    Code Status: Full Code.  Family Communication:  Disposition Plan: To be determined.    Brief narrative: Patient is a 72 y.o. male history of atrial fibrillation, CAD, s/p MI 80s, s/p CABG, CHF, s/p CVA, dyslipidemia,, DM-2, presenting with a couple days history of cough, SOB and sputum production along with clear eye drainage and sinus congestion. He presented to his PCP, who noted that he may have had an aspiration event and treated him for post nasal drip with a short course of prednisone and loratadine. [name] notes that the new medications he received confused him and he may have  mixed up some of his pills, so he is not sure how much of these medications he took. Because of the prednisone, he did call in to his physicians office, due to concern for rising blood sugars. He was requested to continue the prednisone but to make sure to drink lots of water. Since that time, he has noted that his SOB has increased, particularly with talking. He continues to have the cough, sinus pressure, whitish sputum production, subjective fevers. He is unsure if his breathing is worse with walking. He did wake up breathing hard one time (? PND). He denies worsening orthopnea, new chest pain, palpations, worsening leg swelling, weight gain, decreased UOP. Admitted for further management.    Consultants:  Cardiology.  Procedures:  CXR  Antibiotics:  Levaquin 11/28/12>>>  HPI/Subjective: No new issues.   Objective: Vital signs in last 24 hours: Temp:  [97.9 F (36.6 C)-98.5 F (36.9 C)] 98.1 F (36.7 C) (04/27 0552) Pulse Rate:  [61-70] 67 (04/27 0552) Resp:  [15-23] 18 (04/27 0552) BP: (87-106)/(45-74) 94/46 mmHg (04/27 0552) SpO2:  [93 %-98 %] 95 % (04/27 0552) Weight:  [70.67 kg (155 lb 12.8 oz)-72.031 kg (158 lb 12.8 oz)] 70.67 kg (155 lb 12.8 oz) (04/27 0552) Weight change:  Last BM Date: 11/27/12  Intake/Output from previous day: 04/26 0701 - 04/27 0700 In: 480 [P.O.:480] Out: 1550 [Urine:1550]     Physical Exam: General: Comfortable, alert, communicative, fully oriented, not short of breath at rest.  HEENT:  No clinical pallor, no jaundice, no conjunctival injection or discharge. NECK:  Supple, JVP not seen, no carotid bruits, no palpable lymphadenopathy, no palpable goiter. CHEST:  Has bilateral coarse rales,  changing with cough and respiration. No wheeze. Marland Kitchen HEART:  Sounds 1 and 2 heard, normal, regular, no murmurs. ABDOMEN:  Full, soft, non-tender, no palpable organomegaly, no palpable masses, normal bowel sounds. GENITALIA:  Not examined. LOWER EXTREMITIES:   Minimal pitting edema, palpable peripheral pulses. Has pronounced varicosities, and mild stasis eczema.  MUSCULOSKELETAL SYSTEM:  Generalized osteoarthritic changes, otherwise, normal. CENTRAL NERVOUS SYSTEM:  No focal neurologic deficit on gross examination.  Lab Results:  Recent Labs  11/27/12 1405 11/28/12 0505  WBC 4.7 4.0  HGB 12.7* 11.9*  HCT 37.6* 35.6*  PLT 153 143*    Recent Labs  11/27/12 1405 11/28/12 0505  NA 138 139  K 4.2 4.3  CL 103 102  CO2 26 29  GLUCOSE 144* 138*  BUN 32* 33*  CREATININE 1.27 1.38*  CALCIUM 8.9 8.8   No results found for this or any previous visit (from the past 240 hour(s)).   Studies/Results: Dg Chest 2 View  11/27/2012  *RADIOLOGY REPORT*  Clinical Data: Shortness of breath and cough.  CHEST - 2 VIEW  Comparison: None  Findings: Cardiomegaly and CABG changes noted. COPD/emphysema noted. There is no evidence of focal airspace disease, pulmonary edema, suspicious pulmonary nodule/mass, pleural effusion, or pneumothorax. No acute bony abnormalities are identified.  IMPRESSION: No evidence of acute cardiopulmonary disease.  Cardiomegaly and COPD/emphysema.   Original Report Authenticated By: Harmon Pier, M.D.     Medications: Scheduled Meds: . albuterol  2.5 mg Nebulization Q6H  . amiodarone  200 mg Oral Daily  . aspirin EC  81 mg Oral Daily  . atorvastatin  20 mg Oral QHS  . carvedilol  6.25 mg Oral BID WC  . docusate sodium  100 mg Oral Daily  . furosemide  40 mg Intravenous BID  . insulin aspart  0-15 Units Subcutaneous TID WC  . insulin detemir  20 Units Subcutaneous QAC breakfast  . isosorbide mononitrate  30 mg Oral QHS  . linagliptin  5 mg Oral Daily  . lisinopril  10 mg Oral Daily  . potassium chloride  10 mEq Oral BID  . predniSONE  10 mg Oral Q breakfast  . senna  1 tablet Oral BID  . sodium chloride  3 mL Intravenous Q12H  . sodium chloride  3 mL Intravenous Q12H  . Warfarin - Pharmacist Dosing Inpatient   Does not  apply q1800   Continuous Infusions:  PRN Meds:.sodium chloride, acetaminophen, acetaminophen, benzonatate, guaiFENesin-codeine, loratadine, LORazepam, ondansetron (ZOFRAN) IV, ondansetron, sodium chloride    LOS: 1 day   Tyshawna Alarid,CHRISTOPHER  [facility] Pager [phone]. If 8PM-8AM, please contact night-coverage at [email or web address], password [facility] 11/28/2012, 7:07 AM  LOS: 1 day

## 2012-11-28 NOTE — Consult Note (Signed)
CARDIOLOGY CONSULT NOTE  Patient ID: [name] MRN: [id number] DOB/AGE: 02-17-1941 72 y.o.  Admit date: 11/27/2012  Primary PhysicianRichard Alphonsus Sias, MD Primary Cardiologist  Dossie Arbour   HPI:   The patient was admitted with a cough. There was question of some volume overload. There was question of whether or not there was some type of aspiration. I cannot document this. Also he was treated with some prednisone by his primary physician. The patient tells me that he did have some edema that is now gone. There has been some diuresis but I cannot document exactly how much. The patient continues to have an abnormal physical exam as relates to marked rhonchi heard in his lungs. He says that he still has a cough. The cough is worse than his usual baseline cough. In terms of breathing he is doing better since admission.    Past Medical History  Diagnosis Date  . Atrial fibrillation      S/P ablation  . CAD (coronary artery disease)     S/P MI 1980's----------------------Dr McDowell  . Hyperlipidemia   . CHF (congestive heart failure)   . 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 BPH (benign prostatic hypertrophy)   . Stroke   . Sleep disturbance     Family History  Problem Relation Age of Onset  . Parkinson's disease Mother   . Heart disease Father   . Skin cancer Sister   . Heart attack Mother     History   Social History  . Marital Status: Single    Spouse Name: N/A    Number of Children: N/A  . Years of Education: N/A   Occupational History  . disabled due to heart disease    Social History Main Topics  . Smoking status: Former Smoker -- 1.00 packs/day    Types: Cigarettes    Quit date: 07/15/1973  . Smokeless tobacco: Never Used  . Alcohol Use: No  . Drug Use: No  . Sexually Active: Not on file   Other Topics Concern  . Not on file   Social History Narrative   Single--lives alone   Stays with sister with Altzheimer's  frequently   Has living will   Brother, Gerlene Burdock has health care POA   Would accept resuscitation but no prolonged artificial ventilation   No feeding tube if cognitively unaware             Past Surgical History  Procedure Laterality Date  . Coronary artery bypass graft    . Appendectomy    . Tonsillectomy    . Upper gastrointestinal endoscopy      Gastritis , ? H. pylori 08/28/1999  . Atrial ablation surgery       Prescriptions prior to admission  Medication Sig Dispense Refill  . amiodarone (PACERONE) 200 MG tablet Take 200 mg by mouth daily.      Marland Kitchen aspirin EC 81 MG tablet Take 81 mg by mouth daily.      Marland Kitchen atorvastatin (LIPITOR) 20 MG tablet Take 20 mg by mouth at bedtime.       . carvedilol (COREG) 6.25 MG tablet Take 1 tablet (6.25 mg total) by mouth 2 (two) times daily with a meal.  60 tablet  5  . cholecalciferol (VITAMIN D) 1000 UNITS tablet Take 1,000 Units by mouth daily.      Tery Sanfilippo Calcium (STOOL SOFTENER PO) Take 1 capsule by mouth 2 (two) times daily.      Marland Kitchen  insulin detemir (LEVEMIR) 100 UNIT/ML injection Inject 0.2 mLs (20 Units total) into the skin daily before breakfast.  20 mL  1  . isosorbide mononitrate (IMDUR) 30 MG 24 hr tablet Take 30 mg by mouth at bedtime.      Marland Kitchen loratadine (CLARITIN) 10 MG tablet Take 10 mg by mouth 2 (two) times daily as needed for allergies.      Marland Kitchen LORazepam (ATIVAN) 0.5 MG tablet Take 0.5 mg by mouth at bedtime as needed (for sleep).       . Multiple Vitamin (MULTIVITAMIN WITH MINERALS) TABS Take 1 tablet by mouth daily.      . Niacin (VITAMIN B-3 PO) Take 1 tablet by mouth daily.      . Omega-3 Fatty Acids (FISH OIL) 1000 MG CAPS Take 1,000 mg by mouth daily.      . potassium chloride (K-DUR) 10 MEQ tablet Take 1 tablet (10 mEq total) by mouth 2 (two) times daily.  60 tablet  6  . predniSONE (DELTASONE) 20 MG tablet Take 20-40 mg by mouth daily. Tapered dose filled 11/25/2012:  Take 2 tablets (40 mg) daily for 3 days, then take 1  tablet (20 mg) daily for 3 days      . quinapril (ACCUPRIL) 10 MG tablet Take 10 mg by mouth at bedtime.      . sitaGLIPtin (JANUVIA) 100 MG tablet Take 1 tablet (100 mg total) by mouth daily.  90 tablet  1  . torsemide (DEMADEX) 20 MG tablet Take 20-40 mg by mouth 2 (two) times daily. 1-2 tablets - depending on amount of fluid retention      . triamcinolone (KENALOG) 0.1 % cream Apply 1 application topically 2 (two) times daily as needed (for scaling (eczema) under eyes).       . vitamin E 400 UNIT capsule Take 400 Units by mouth daily.      Marland Kitchen warfarin (COUMADIN) 5 MG tablet Take 5-7.5 mg by mouth every evening. Take 1 1/2 tablets (7.5 mg) on Monday nights, take 1 tablet (5 mg) on all other days      . glucose blood (RELION PRIME TEST) test strip Test blood sugar 3-5 times daily, dx: 250.00  200 each  11  . Insulin Syringe-Needle U-100 (BD INSULIN SYRINGE ULTRAFINE) 31G X 5/16" 1 ML MISC USE AS DIRECTED  100 each  11   Review of systems:   Patient denies fever, chills, headache, sweats, rash, change in vision, change in hearing, chest pain, nausea vomiting, urinary symptoms. All other systems are reviewed and are negative other than the history of present illness. Physical Exam: Blood pressure 94/46, pulse 67, temperature 98.1 F (36.7 C), temperature source Oral, resp. rate 18, height 5\' 9"  (1.753 m), weight 155 lb 12.8 oz (70.67 kg), SpO2 94.00%. The patient is oriented to person time and place. Affect is normal. There are 2 sisters and brother in the room at the time of evaluation. He answers his questions slowly. He seems to understand my questions. There is no jugulovenous distention. Lungs reveal diffuse rhonchi. There is some mild wheezing. Cardiac exam reveals S1 and S2. The abdomen is soft. He has no edema at this time. He has chronic venous changes in the skin of his lower extremities.   Labs:   Lab Results  Component Value Date   WBC 4.0 11/28/2012   HGB 11.9* 11/28/2012   HCT 35.6*  11/28/2012   MCV 85.8 11/28/2012   PLT 143* 11/28/2012    Recent Labs  Lab 11/28/12 0505  NA 139  K 4.3  CL 102  CO2 29  BUN 33*  CREATININE 1.38*  CALCIUM 8.8  GLUCOSE 138*   Lab Results  Component Value Date   TROPONINI <0.30 11/27/2012    Lab Results  Component Value Date   CHOL 105 08/20/2012   CHOL 88 07/21/2011   CHOL 86 07/26/2010   Lab Results  Component Value Date   HDL 24.30* 08/20/2012   HDL 28.90* 07/21/2011   HDL 26* 07/26/2010   Lab Results  Component Value Date   LDLCALC 62 08/20/2012   LDLCALC 47 07/21/2011   LDLCALC 44 07/26/2010   Lab Results  Component Value Date   TRIG 95.0 08/20/2012   TRIG 63.0 07/21/2011   TRIG 78 07/26/2010   Lab Results  Component Value Date   CHOLHDL 4 08/20/2012   CHOLHDL 3 07/21/2011   CHOLHDL 3.3 Ratio 07/26/2010   Lab Results  Component Value Date   LDLDIRECT 72.9 07/13/2009   LDLDIRECT 60.0 01/18/2009   LDLDIRECT 65.9 09/20/2008    BNP (last 3 results)  Recent Labs  03/29/12 1335 11/27/12 1405  PROBNP 322.0* 1210.0*      Radiology: Dg Chest 2 View  11/27/2012  *RADIOLOGY REPORT*  Clinical Data: Shortness of breath and cough.  CHEST - 2 VIEW  Comparison: None  Findings: Cardiomegaly and CABG changes noted. COPD/emphysema noted. There is no evidence of focal airspace disease, pulmonary edema, suspicious pulmonary nodule/mass, pleural effusion, or pneumothorax. No acute bony abnormalities are identified.  IMPRESSION: No evidence of acute cardiopulmonary disease.  Cardiomegaly and COPD/emphysema.   Original Report Authenticated By: Harmon Pier, M.D.    EKG: I have reviewed telemetry today November 28, 2012. There is sinus rhythm. EKG reveals old interventricular conduction delay.  ASSESSMENT AND PLAN:     CORONARY ARTERY DISEASE     Coronary disease is stable. No further workup.    Atrial fibrillation     There is a history of atrial fibrillation that has been ablated. He does continue on amiodarone. He is  holding sinus rhythm. No further workup.    EMPHYSEMA   Type 2 diabetes mellitus   HTN (hypertension)    Acute on chronic systolic CHF     As I outlined in my first sentence as above, I'm not completely sure how much he has diuresed. However he says that his edema is now gone. There may have been a small component of CHF as part of his presenting symptoms. However I do not believe that this is the major problem. Currently I agree his volume status is stable. I have stopped his IV diuretics and changed him to his home oral dose of torsemide. Renal function will have to be followed further here in the hospital. As of today I recommend no other changes in the approach to his history of CHF.         Post-nasal drip    Acute bronchitis      His ongoing bronchitis and cough appear to be the major issue. I'm not sure why he has this persistent cough. It may be that he just needs more time to recover from bronchitis that has been treated up to this point. I will have to leave this decision making up to the primary team.    Cardiomyopathy      I have chosen not to make any changes in his meds for his cardiomyopathy. It will be best to do this by his primary cardiologist  as an outpatient.    H/O amiodarone therapy      The patient is on amiodarone. It does not appear that this is causing the current pulmonary problem. No change in therapy.    Hx of CABG  We will follow him with you.  11/28/2012, 1:48 PM

## 2012-11-28 NOTE — Progress Notes (Signed)
Utilization review completed.  

## 2012-11-28 NOTE — Progress Notes (Signed)
ANTICOAGULATION CONSULT NOTE - Initial Consult  Pharmacy Consult for warfarin Indication: atrial fibrillation  No Known Allergies  Patient Measurements: Height: 5\' 9"  (175.3 cm) Weight: 155 lb 12.8 oz (70.67 kg) (scale a) IBW/kg (Calculated) : 70.7  Vital Signs: Temp: 98.1 F (36.7 C) (04/27 0552) Temp src: Oral (04/27 0552) BP: 94/46 mmHg (04/27 0552) Pulse Rate: 67 (04/27 0552)  Labs:  Recent Labs  11/27/12 1405 11/27/12 1553 11/27/12 2017 11/27/12 2345 11/28/12 0505  HGB 12.7*  --   --   --  11.9*  HCT 37.6*  --   --   --  35.6*  PLT 153  --   --   --  143*  LABPROT  --  23.0*  --   --  23.1*  INR  --  2.14*  --   --  2.15*  CREATININE 1.27  --   --   --  1.38*  TROPONINI <0.30  --  <0.30 <0.30  --     Estimated Creatinine Clearance: 49.8 ml/min (by C-G formula based on Cr of 1.38).  Assessment: 71 yom on chronic warfarin for afib (home dose 5mg  all days but 7.5mg  on Mondays) admitted for cough and SOB starting Levaquin. INR remains therapeutic at 2.15. No bleeding noted, H/H/plts decreased slightly, will continue to watch and be cautious as Levaquin can increase effects of warfarin.  Goal of Therapy:  INR 2-3   Plan:  1. Coumadin 5mg  PO x 1 tonight 2. Daily PT/INR  Lalia Loudon D. Chanley Mcenery, PharmD Clinical Pharmacist Pager: [phone] 11/28/2012 11:16 AM

## 2012-11-29 ENCOUNTER — Telehealth: Payer: Self-pay | Admitting: Family Medicine

## 2012-11-29 DIAGNOSIS — E1159 Type 2 diabetes mellitus with other circulatory complications: Secondary | ICD-10-CM | POA: Diagnosis present

## 2012-11-29 LAB — GLUCOSE, CAPILLARY
Glucose-Capillary: 198 mg/dL — ABNORMAL HIGH (ref 70–99)
Glucose-Capillary: 259 mg/dL — ABNORMAL HIGH (ref 70–99)
Glucose-Capillary: 107 mg/dL — ABNORMAL HIGH (ref 70–99)

## 2012-11-29 LAB — CBC
MCH: 29 pg (ref 26.0–34.0)
Platelets: 128 10*3/uL — ABNORMAL LOW (ref 150–400)
RBC: 4.14 MIL/uL — ABNORMAL LOW (ref 4.22–5.81)
WBC: 4.5 10*3/uL (ref 4.0–10.5)

## 2012-11-29 LAB — BASIC METABOLIC PANEL
CO2: 28 mEq/L (ref 19–32)
Calcium: 8.8 mg/dL (ref 8.4–10.5)
Chloride: 102 mEq/L (ref 96–112)
Potassium: 3.9 mEq/L (ref 3.5–5.1)
Sodium: 138 mEq/L (ref 135–145)

## 2012-11-29 LAB — PRO B NATRIURETIC PEPTIDE: Pro B Natriuretic peptide (BNP): 1278 pg/mL — ABNORMAL HIGH (ref 0–125)

## 2012-11-29 LAB — PROTIME-INR
INR: 2.06 — ABNORMAL HIGH (ref 0.00–1.49)
Prothrombin Time: 22.4 seconds — ABNORMAL HIGH (ref 11.6–15.2)

## 2012-11-29 MED ORDER — INSULIN ASPART 100 UNIT/ML ~~LOC~~ SOLN
4.0000 [IU] | Freq: Three times a day (TID) | SUBCUTANEOUS | Status: DC
  Administered 2012-11-29 – 2012-11-30 (×3): 4 [IU] via SUBCUTANEOUS

## 2012-11-29 MED ORDER — ALBUTEROL SULFATE (5 MG/ML) 0.5% IN NEBU
2.5000 mg | INHALATION_SOLUTION | Freq: Three times a day (TID) | RESPIRATORY_TRACT | Status: DC
  Administered 2012-11-30 (×2): 2.5 mg via RESPIRATORY_TRACT
  Filled 2012-11-29 (×2): qty 0.5

## 2012-11-29 MED ORDER — WARFARIN SODIUM 7.5 MG PO TABS
7.5000 mg | ORAL_TABLET | Freq: Once | ORAL | Status: AC
  Administered 2012-11-29: 7.5 mg via ORAL
  Filled 2012-11-29: qty 1

## 2012-11-29 NOTE — Progress Notes (Signed)
Inpatient Diabetes Program Recommendations  AACE/ADA: New Consensus Statement on Inpatient Glycemic Control (2013)  Target Ranges:  Prepandial:   less than 140 mg/dL      Peak postprandial:   less than 180 mg/dL (1-2 hours)      Critically ill patients:  140 - 180 mg/dL   Reason for Assessment: Elevated glucose in hospital presumable secondary to steroid  Inpatient Diabetes Program Recommendations Insulin - Basal: On home dose of Levemir 20 units every morning  Correction (SSI): On Moderate correction scale Novolog Insulin - Meal Coverage: No meal coverage at present Oral Agents: At home, takes Januvia.  In hospital receiving Tradjenta. HgbA1C: Hgb A1C is 6.4 reflecting excellent control Diet: CHO modified medium  Note:  Results for BASHEER, MOLCHAN (MRN [id number]) as of 11/29/2012 11:57  Ref. Range 11/28/2012 05:59 11/28/2012 21:09 11/29/2012 05:56 11/29/2012 11:17  Glucose-Capillary Latest Range: 70-99 mg/dL 409 (H) 811 (H) 914 (H) 249 (H)    Despite correction scale, CBG's have been above target-- worsening with meal intake.  While on prednisone, request MD consider adding 4 to 6 units of regularly scheduled Novolog meal coverage tid with meals to be given in addition to correction scale provided patient eats at least 50% of meal and CBG is at least 80 mg/dl.  Thank you.  Asjah Rauda S. Elsie Lincoln, RN, CNS, CDE Inpatient Diabetes Program, team pager [phone]

## 2012-11-29 NOTE — Telephone Encounter (Signed)
He is in the hospital at [facility] now I spoke to him about his condition this morning

## 2012-11-29 NOTE — Progress Notes (Signed)
Utilization Review Completed Stella Bortle J. Adena Sima, RN, BSN, NCM [phone]  

## 2012-11-29 NOTE — Progress Notes (Signed)
[facility] PROGRESS NOTE  [name] ZOX:[id number] DOB: Sep 08, 1940 DOA: 11/27/2012 PCP: Tillman Abide, MD  Assessment/Plan: Principal Problem:   Bronchitis Active Problems:   CORONARY ARTERY DISEASE   Atrial fibrillation   EMPHYSEMA   Type 2 diabetes mellitus   HTN (hypertension)   Post-nasal drip   Acute bronchitis   Cardiomyopathy   H/O amiodarone therapy   Hx of CABG   Acute on chronic systolic CHF (congestive heart failure)    1. Acute Bronchitis/Exacerbation of COPD: Patient presented with couple days history of cough, SOB and sputum production along with clear eye drainage and sinus congestion. CXR is devoid of acute findings. Managing with a short course of steroids, bronchodilator nebulizers, as well as supportive treatment. Phlegm is still very purulent. Commenced a 7-day course of Levaquin on 11/28/12, now day #2. Will start tapering steroids. Patient has requested home bronchodilators on discharge.   2. Chronic systolic heart failure, EF 25-30%: Patient denies worsening orthopnea, new chest pain, palpations, worsening leg swelling, weight gain. Clinically, CHF appears compensated and ProNBNP is only 1210. Managed initially with iv Lasix. Dr Myrtis Ser provided cardiology consultation, and we are managing as recommended. On Coreg, Aspirin, Imdur, Statin, Quinapril. Cardiac enzymes are unelevated. Per cardiology, has been recommenced on oral diuretics.  3. Atrial fibrillation: Rate-controlled on Amiodarone, Coreg. Coumadin per pharmacy. Appears to be in SR today.  4. DM 2: At presentation, patient's CBC was reasonably controlled at 123, and has remained so. HBA1C is 6.4. On Levemir, Januvia and SSI. Bump in CBG today. Will observe.  5. Insomnia: Not problematic.    Code Status: Full Code.  Family Communication:  Disposition Plan: To be determined.    Brief narrative: Patient is a 72 y.o. male history of atrial fibrillation, CAD, s/p MI 35s, s/p CABG, CHF, s/p CVA,  dyslipidemia,, DM-2, presenting with a couple days history of cough, SOB and sputum production along with clear eye drainage and sinus congestion. He presented to his PCP, who noted that he may have had an aspiration event and treated him for post nasal drip with a short course of prednisone and loratadine. [name] notes that the new medications he received confused him and he may have mixed up some of his pills, so he is not sure how much of these medications he took. Because of the prednisone, he did call in to his physicians office, due to concern for rising blood sugars. He was requested to continue the prednisone but to make sure to drink lots of water. Since that time, he has noted that his SOB has increased, particularly with talking. He continues to have the cough, sinus pressure, whitish sputum production, subjective fevers. He is unsure if his breathing is worse with walking. He did wake up breathing hard one time (? PND). He denies worsening orthopnea, new chest pain, palpations, worsening leg swelling, weight gain, decreased UOP. Admitted for further management.    Consultants:  Cardiology.  Procedures:  CXR  Antibiotics:  Levaquin 11/28/12>>>  HPI/Subjective: Feels better. Coughing up purulent phlegm.   Objective: Vital signs in last 24 hours: Temp:  [97.5 F (36.4 C)-98.7 F (37.1 C)] 98.1 F (36.7 C) (04/28 0504) Pulse Rate:  [65-85] 70 (04/28 0504) Resp:  [18-20] 18 (04/28 0504) BP: (96-103)/(40-49) 100/44 mmHg (04/28 0504) SpO2:  [94 %-98 %] 94 % (04/28 0800) Weight:  [70.897 kg (156 lb 4.8 oz)] 70.897 kg (156 lb 4.8 oz) (04/28 0504) Weight change: -1.134 kg (-2 lb 8 oz) Last BM  Date: 11/27/12  Intake/Output from previous day: 04/27 0701 - 04/28 0700 In: 480 [P.O.:480] Out: 1800 [Urine:1800] Total I/O In: 240 [P.O.:240] Out: -    Physical Exam: General: Comfortable, alert, communicative, fully oriented, not short of breath at rest.  HEENT:  No clinical  pallor, no jaundice, no conjunctival injection or discharge. NECK:  Supple, JVP not seen, no carotid bruits, no palpable lymphadenopathy, no palpable goiter. CHEST:  Has bilateral coarse rales, changing with cough and respiration. No wheeze. HEART:  Sounds 1 and 2 heard, normal, regular, no murmurs. ABDOMEN:  Full, soft, non-tender, no palpable organomegaly, no palpable masses, normal bowel sounds. GENITALIA:  Not examined. LOWER EXTREMITIES:  Minimal pitting edema, palpable peripheral pulses. Has pronounced varicosities, and mild stasis eczema.  MUSCULOSKELETAL SYSTEM:  Generalized osteoarthritic changes, otherwise, normal. CENTRAL NERVOUS SYSTEM:  No focal neurologic deficit on gross examination.  Lab Results:  Recent Labs  11/28/12 0505 11/29/12 0655  WBC 4.0 4.5  HGB 11.9* 12.0*  HCT 35.6* 35.5*  PLT 143* 128*    Recent Labs  11/28/12 0505 11/29/12 0655  NA 139 138  K 4.3 3.9  CL 102 102  CO2 29 28  GLUCOSE 138* 226*  BUN 33* 25*  CREATININE 1.38* 1.08  CALCIUM 8.8 8.8   No results found for this or any previous visit (from the past 240 hour(s)).   Studies/Results: Dg Chest 2 View  11/27/2012  *RADIOLOGY REPORT*  Clinical Data: Shortness of breath and cough.  CHEST - 2 VIEW  Comparison: None  Findings: Cardiomegaly and CABG changes noted. COPD/emphysema noted. There is no evidence of focal airspace disease, pulmonary edema, suspicious pulmonary nodule/mass, pleural effusion, or pneumothorax. No acute bony abnormalities are identified.  IMPRESSION: No evidence of acute cardiopulmonary disease.  Cardiomegaly and COPD/emphysema.   Original Report Authenticated By: Harmon Pier, M.D.     Medications: Scheduled Meds: . albuterol  2.5 mg Nebulization Q6H  . amiodarone  200 mg Oral Daily  . aspirin EC  81 mg Oral Daily  . atorvastatin  20 mg Oral QHS  . benzonatate  200 mg Oral TID  . carvedilol  6.25 mg Oral BID WC  . docusate sodium  100 mg Oral Daily  . insulin aspart   0-15 Units Subcutaneous TID WC  . insulin detemir  20 Units Subcutaneous QAC breakfast  . isosorbide mononitrate  30 mg Oral QHS  . levofloxacin  500 mg Oral Daily  . linagliptin  5 mg Oral Daily  . lisinopril  10 mg Oral Daily  . potassium chloride  10 mEq Oral BID  . predniSONE  10 mg Oral Q breakfast  . senna  1 tablet Oral BID  . sodium chloride  3 mL Intravenous Q12H  . sodium chloride  3 mL Intravenous Q12H  . torsemide  20 mg Oral Daily  . Warfarin - Pharmacist Dosing Inpatient   Does not apply q1800   Continuous Infusions:  PRN Meds:.sodium chloride, acetaminophen, acetaminophen, guaiFENesin-codeine, loratadine, LORazepam, ondansetron (ZOFRAN) IV, ondansetron, sodium chloride    LOS: 2 days   Kellye Mizner,CHRISTOPHER  [facility] Pager [phone]. If 8PM-8AM, please contact night-coverage at [email or web address], password [facility] 11/29/2012, 9:14 AM  LOS: 2 days

## 2012-11-29 NOTE — Telephone Encounter (Signed)
[facility] Triage Call Report Triage Record Num: [id number] Operator: Patriciaann Clan Patient Name: Chrissie Noa Aitken Call Date & Time: 11/27/2012 12:38:11PM Patient Phone: [phone] PCP: Tillman Abide Patient Gender: Male PCP Fax : [phone] Patient DOB: 20-Nov-1940 Practice Name: [facility] Reason for Call: Caller: [place]/Patient; PCP: Tillman Abide (Family Practice); CB#: [id number]; Call regarding Medication and Blood Sugar concerns. Patient states he has been having problems with his blood sugar being elevated. Patient states he thinks he may have taken 2 Amniodorone instead of 2 Prednisone, in error, 11/26/12. Patient is confused about which medications he has taken. Patient sounds dyspneic during call. Patient states he has history of COPD and that his breathing is no worse than usual. Patient is speaking in short sentences. Patient states his blood sugar was 316 at 1000 11/27/12. Patient states he was driving to the [place] office to be seen but they were closed. Patient states he is currently in the parking lot of [employer]. Patient now states his blood sugar was 135 at 1000 11/27/12. Patient is providing conflicting information and sounds confused at intervals. RN advised patient to park the car and not to drive. Patient agreeable. Triage per Diabetes: Control Problems Protocol. Emergent Disposition obtained related to positive triage assessment for " Signs and symtoms of ketoacidosis and blood sugar more that 300mg ./dl. RN informed patient that she is going to call EMS for patient to be transported to the ED. Patient agreeable. Patient states he feels " a little bit" confused. Patient states he does not have any family or friends in the area. RN spoke with Dispatch/Crystal at [facility] and informed her of above and that patient is at the [employer] on [address] in a brown truck. EMS was dispatched to patient. RN continues to  have patient on the line. Patient requests that his Hoover Brunette at [phone] and his Sister/Mary at [phone] be notified. EMS arrived to patient during call. RN spoke with Patient's Sister/Billie, informed of above. States she prefers to call her Sister/Mary and notify her of above. States she lives 100 miles away but will call Patient and come to the hospital. Protocol(s) Used: Diabetes: Control Problems Recommended Outcome per Protocol: See ED Immediately Reason for Outcome: Signs and symptoms of ketoacidosis AND blood sugar more than 300 mg/dl Care Advice: Dehydration can affect blood sugar levels. Drink water during transport and while waiting to see a provider. If vomiting, take sips of water or suck on ice chips. ~ ~ IMMEDIATE ACTION ~ Call EMS 911 if any loss of consciousness, difficult to awaken, slow to respond, or new onset of confused thinking. 11/27/2012 1:31:22PM Page 1 of 1 [email or web address]

## 2012-11-29 NOTE — Progress Notes (Signed)
ANTICOAGULATION CONSULT NOTE - Follow Up Consult  Pharmacy Consult for Coumadin Indication: atrial fibrillation  No Known Allergies  Patient Measurements: Height: 5\' 9"  (175.3 cm) Weight: 156 lb 4.8 oz (70.897 kg) (scale a) IBW/kg (Calculated) : 70.7  Vital Signs: Temp: 97.4 F (36.3 C) (04/28 0952) Temp src: Oral (04/28 0952) BP: 103/51 mmHg (04/28 0952) Pulse Rate: 65 (04/28 0952)  Labs:  Recent Labs  11/27/12 1405 11/27/12 1553 11/27/12 2017 11/27/12 2345 11/28/12 0505 11/29/12 0655  HGB 12.7*  --   --   --  11.9* 12.0*  HCT 37.6*  --   --   --  35.6* 35.5*  PLT 153  --   --   --  143* 128*  LABPROT  --  23.0*  --   --  23.1* 22.4*  INR  --  2.14*  --   --  2.15* 2.06*  CREATININE 1.27  --   --   --  1.38* 1.08  TROPONINI <0.30  --  <0.30 <0.30  --   --     Estimated Creatinine Clearance: 63.6 ml/min (by C-G formula based on Cr of 1.08).  Assessment:  INR is therapeutic but has dropped a bit.  Home Coumadin regimen:  5 mg daily except 7.5 mg on Mondays.   Day # 2 Levaquin (7 days planned).  Platelet count has dropped some. Has been <150K.  On Prednisone 10 mg daily.  Goal of Therapy:  INR 2-3 Monitor platelets by anticoagulation protocol: Yes   Plan:   Coumadin 7.5 mg today. Usual Monday dose.  Continue daily PT/INR for now.  Watch for Levaquin effect on Coumadin.  Follow up CBC/platelet count.  Dennie Fetters, [place] Pager: [phone] 11/29/2012,11:33 AM

## 2012-11-29 NOTE — Discharge Summary (Addendum)
Physician Discharge Summary  Sahith Nurse Abed WUJ:[id number] DOB: 01-29-1941 DOA: 11/27/2012  PCP: Tillman Abide, MD  Admit date: 11/27/2012 Discharge date: 11/30/2012  Time spent:40 minutes  Recommendations for Outpatient Follow-up:  1. Follow up with PMD.  2. Follow up with primary cardiologist.   Discharge Diagnoses:  Principal Problem:   Bronchitis Active Problems:   CORONARY ARTERY DISEASE   Atrial fibrillation   EMPHYSEMA   Type 2 diabetes mellitus   HTN (hypertension)   Post-nasal drip   Acute bronchitis   Cardiomyopathy   H/O amiodarone therapy   Hx of CABG   Acute on chronic systolic CHF (congestive heart failure)   Type II or unspecified type diabetes mellitus without mention of complication, uncontrolled   Discharge Condition: Satisfactory.   Diet recommendation: Heart-Healthy/Carbohydrate-Modified.   Filed Weights   11/28/12 0552 11/29/12 0504 11/30/12 0500  Weight: 70.67 kg (155 lb 12.8 oz) 70.897 kg (156 lb 4.8 oz) 70.398 kg (155 lb 3.2 oz)    History of present illness:  Patient is a 72 y.o. male history of atrial fibrillation, CAD, s/p MI 19s, s/p CABG, CHF, s/p CVA, dyslipidemia,, DM-2, presenting with a couple days history of cough, SOB and sputum production along with clear eye drainage and sinus congestion. He presented to his PCP, who noted that he may have had an aspiration event and treated him for post nasal drip with a short course of prednisone and loratadine. Mr. Mederos notes that the new medications he received confused him and he may have mixed up some of his pills, so he is not sure how much of these medications he took. Because of the prednisone, he did call in to his physicians office, due to concern for rising blood sugars. He was requested to continue the prednisone but to make sure to drink lots of water. Since that time, he has noted that his SOB has increased, particularly with talking. He continues to have the cough, sinus pressure, whitish  sputum production, subjective fevers. He is unsure if his breathing is worse with walking. He did wake up breathing hard one time (? PND). He denies worsening orthopnea, new chest pain, palpations, worsening leg swelling, weight gain, decreased UOP. Admitted for further management.    Hospital Course:  1. Acute Bronchitis/Exacerbation of COPD: Patient presented with couple days history of cough, SOB and sputum production along with clear eye drainage and sinus congestion. CXR was devoid of acute findings. Managed with a short course of steroids, bronchodilator nebulizers, as well as supportive treatment. As phlegm remained very purulent, he was commenced on a 7-day course of Levaquin on 11/28/12, to be concluded on 12/04/12, with clinical improvement. Patient has requested home bronchodilators on discharge.  2. Chronic systolic heart failure, EF 25-30%: Patient denied worsening orthopnea, new chest pain, palpations, worsening leg swelling, weight gain. Clinically, CHF appeared compensated and ProNBNP was only 1210. Cardiac enzymes remained unelevated. Managed initially with iv Lasix. Dr Myrtis Ser provided cardiology consultation, and as of 11/29/12, he was recommenced on pre-admission oral diuretics, without deleterious effect. Clinically, he is euvolemic.  3. Atrial fibrillation: Rate-controlled on Amiodarone, Coreg. Coumadin per pharmacy. As of 11/29/12, was in SR.  4. DM 2: At presentation, patient's CBC was reasonably controlled at 123, aand HBA1C is 6.4. Managed with diet, Levemir, Januvia and SSI. Although there was a bump in CBG on 11/29/12, presumed secondary to steroids, DM is overall, controlled.  5. Insomnia: Not problematic.    Procedures:  See Below.   Consultations:  Dr  Willa Rough, cardiologist.   Discharge Exam: Filed Vitals:   11/30/12 0747 11/30/12 0932 11/30/12 1433 11/30/12 1441  BP:  104/49 102/52   Pulse:  61 64   Temp:  97.6 F (36.4 C) 97.6 F (36.4 C)   TempSrc:  Oral Oral    Resp:  20 16   Height:      Weight:      SpO2: 98% 98% 99% 98%    Physical Exam:  General: Comfortable, alert, communicative, fully oriented, not short of breath at rest.  HEENT: No clinical pallor, no jaundice, no conjunctival injection or discharge.  NECK: Supple, JVP not seen, no carotid bruits, no palpable lymphadenopathy, no palpable goiter.  CHEST: Has bilateral coarse rales, changing with cough and respiration. No wheeze.  HEART: Sounds 1 and 2 heard, normal, regular, no murmurs.  ABDOMEN: Full, soft, non-tender, no palpable organomegaly, no palpable masses, normal bowel sounds.  GENITALIA: Not examined.  LOWER EXTREMITIES: Minimal pitting edema, palpable peripheral pulses. Has pronounced varicosities, and mild stasis eczema.  MUSCULOSKELETAL SYSTEM: Generalized osteoarthritic changes, otherwise, normal.  CENTRAL NERVOUS SYSTEM: No focal neurologic deficit on gross examination.  Discharge Instructions      Discharge Orders   Future Appointments Provider Department Dept Phone   12/23/2012 4:00 PM [facility] [place] [phone]   02/17/2013 3:30 PM Karie Schwalbe, MD [facility] [phone]   Future Orders Complete By Expires     DME Nebulizer machine  As directed     Diet - low sodium heart healthy  As directed     Diet Carb Modified  As directed     Increase activity slowly  As directed         Medication List    STOP taking these medications       predniSONE 20 MG tablet  Commonly known as:  DELTASONE      TAKE these medications       albuterol (5 MG/ML) 0.5% nebulizer solution  Commonly known as:  PROVENTIL  Take 0.5 mLs (2.5 mg total) by nebulization every 6 (six) hours as needed for wheezing or shortness of breath.     amiodarone 200 MG tablet  Commonly known as:  PACERONE  Take 200 mg by mouth daily.     aspirin EC 81 MG tablet  Take 81 mg by mouth daily.     atorvastatin 20 MG  tablet  Commonly known as:  LIPITOR  Take 20 mg by mouth at bedtime.     benzonatate 200 MG capsule  Commonly known as:  TESSALON  Take 1 capsule (200 mg total) by mouth 3 (three) times daily.     carvedilol 6.25 MG tablet  Commonly known as:  COREG  Take 1 tablet (6.25 mg total) by mouth 2 (two) times daily with a meal.     cholecalciferol 1000 UNITS tablet  Commonly known as:  VITAMIN D  Take 1,000 Units by mouth daily.     Fish Oil 1000 MG Caps  Take 1,000 mg by mouth daily.     glucose blood test strip  Commonly known as:  RELION PRIME TEST  Test blood sugar 3-5 times daily, dx: 250.00     insulin detemir 100 UNIT/ML injection  Commonly known as:  LEVEMIR  Inject 0.2 mLs (20 Units total) into the skin daily before breakfast.     Insulin Syringe-Needle U-100 31G X 5/16" 1 ML Misc  Commonly known as:  BD  INSULIN SYRINGE ULTRAFINE  USE AS DIRECTED     isosorbide mononitrate 30 MG 24 hr tablet  Commonly known as:  IMDUR  Take 30 mg by mouth at bedtime.     levofloxacin 500 MG tablet  Commonly known as:  LEVAQUIN  Take 1 tablet (500 mg total) by mouth daily.     loratadine 10 MG tablet  Commonly known as:  CLARITIN  Take 10 mg by mouth 2 (two) times daily as needed for allergies.     LORazepam 0.5 MG tablet  Commonly known as:  ATIVAN  Take 0.5 mg by mouth at bedtime as needed (for sleep).     multivitamin with minerals Tabs  Take 1 tablet by mouth daily.     potassium chloride 10 MEQ tablet  Commonly known as:  K-DUR  Take 1 tablet (10 mEq total) by mouth 2 (two) times daily.     quinapril 10 MG tablet  Commonly known as:  ACCUPRIL  Take 10 mg by mouth at bedtime.     sitaGLIPtin 100 MG tablet  Commonly known as:  JANUVIA  Take 1 tablet (100 mg total) by mouth daily.     STOOL SOFTENER PO  Take 1 capsule by mouth 2 (two) times daily.     torsemide 20 MG tablet  Commonly known as:  DEMADEX  Take 20-40 mg by mouth 2 (two) times daily. 1-2 tablets -  depending on amount of fluid retention     triamcinolone cream 0.1 %  Commonly known as:  KENALOG  Apply 1 application topically 2 (two) times daily as needed (for scaling (eczema) under eyes).     VITAMIN B-3 PO  Take 1 tablet by mouth daily.     vitamin E 400 UNIT capsule  Take 400 Units by mouth daily.     warfarin 5 MG tablet  Commonly known as:  COUMADIN  Take 5-7.5 mg by mouth every evening. Take 1 1/2 tablets (7.5 mg) on Monday nights, take 1 tablet (5 mg) on all other days       Follow-up Information   Schedule an appointment as soon as possible for a visit with Tillman Abide, MD.   Contact information:   [address] [place] [address] [phone]       Please follow up. (Follow up with primary cardiologist. )        The results of significant diagnostics from this hospitalization (including imaging, microbiology, ancillary and laboratory) are listed below for reference.    Significant Diagnostic Studies: Dg Chest 2 View  11/27/2012  *RADIOLOGY REPORT*  Clinical Data: Shortness of breath and cough.  CHEST - 2 VIEW  Comparison: None  Findings: Cardiomegaly and CABG changes noted. COPD/emphysema noted. There is no evidence of focal airspace disease, pulmonary edema, suspicious pulmonary nodule/mass, pleural effusion, or pneumothorax. No acute bony abnormalities are identified.  IMPRESSION: No evidence of acute cardiopulmonary disease.  Cardiomegaly and COPD/emphysema.   Original Report Authenticated By: Harmon Pier, M.D.    US Abdomen Complete  11/04/2012  *RADIOLOGY REPORT*  Clinical Data:  Enlarged liver on physical exam, post prandial nausea  COMPLETE ABDOMINAL ULTRASOUND  Comparison:  None.  Findings:  Gallbladder:  There is mobile gallbladder sludge versus tiny nonshadowing gallstones.  The gallbladder wall is somewhat prominent measuring 3.4 mm diffusely.  This can be seen with hypoproteinemia.  No pain is present with compression over the gallbladder.   There is a small amount of fluid adjacent to the gallbladder.  Common bile duct:  The common bile duct is normal measuring 5.1 mm in diameter.  Liver:  The liver is prominent measuring 19.7 cm sagittally.  No focal hepatic abnormality is seen.  IVC:  Appears normal.  Pancreas:  The tail of the pancreas is obscured by bowel gas.  Spleen:  The spleen is enlarged measuring 14.0 cm sagittally with a volume of 756 ml.  Right Kidney:  No hydronephrosis is seen.  The right kidney measures 11.7 cm sagittally.  Left Kidney:  No hydronephrosis is noted.  The left kidney measures 13.2 cm.  Abdominal aorta:  It the abdominal aorta is normal in caliber with atheromatous change present.  IMPRESSION:  1.  Splenomegaly. 2.  Thickened gallbladder wall diffusely may indicate hypoproteinemia.  No present evidence of acute cholecystitis is seen.  Correlate with liver function tests. 3.  Small amount of gallbladder sludge or nonshadowing gallstones. 4.  Hepatomegaly. 5.  The tail of the pancreas is obscured by bowel gas.   Original Report Authenticated By: Dwyane Dee, M.D.     Microbiology: No results found for this or any previous visit (from the past 240 hour(s)).   Labs: Basic Metabolic Panel:  Recent Labs Lab 11/27/12 1405 11/28/12 0505 11/29/12 0655 11/30/12 0558  NA 138 139 138 138  K 4.2 4.3 3.9 4.0  CL 103 102 102 103  CO2 26 29 28 25   GLUCOSE 144* 138* 226* 134*  BUN 32* 33* 25* 21  CREATININE 1.27 1.38* 1.08 1.06  CALCIUM 8.9 8.8 8.8 8.9   Liver Function Tests: No results found for this basename: AST, ALT, ALKPHOS, BILITOT, PROT, ALBUMIN,  in the last 168 hours No results found for this basename: LIPASE, AMYLASE,  in the last 168 hours No results found for this basename: AMMONIA,  in the last 168 hours CBC:  Recent Labs Lab 11/27/12 1405 11/28/12 0505 11/29/12 0655 11/30/12 0558  WBC 4.7 4.0 4.5 4.5  NEUTROABS 3.4  --   --   --   HGB 12.7* 11.9* 12.0* 11.8*  HCT 37.6* 35.6* 35.5* 36.0*   MCV 85.5 85.8 85.7 86.3  PLT 153 143* 128* 130*   Cardiac Enzymes:  Recent Labs Lab 11/27/12 1405 11/27/12 2017 11/27/12 2345  TROPONINI <0.30 <0.30 <0.30   BNP: BNP (last 3 results)  Recent Labs  03/29/12 1335 11/27/12 1405 11/29/12 0655  PROBNP 322.0* 1210.0* 1278.0*   CBG:  Recent Labs Lab 11/29/12 0556 11/29/12 1117 11/29/12 1612 11/29/12 2131 11/30/12 0554  GLUCAP 198* 249* 259* 107* 158*       Signed:  Esma Kilts,CHRISTOPHER  [facility] 11/30/2012, 3:23 PM

## 2012-11-30 LAB — CBC
MCV: 86.3 fL (ref 78.0–100.0)
Platelets: 130 10*3/uL — ABNORMAL LOW (ref 150–400)
RBC: 4.17 MIL/uL — ABNORMAL LOW (ref 4.22–5.81)
WBC: 4.5 10*3/uL (ref 4.0–10.5)

## 2012-11-30 LAB — BASIC METABOLIC PANEL
CO2: 25 mEq/L (ref 19–32)
Chloride: 103 mEq/L (ref 96–112)
Creatinine, Ser: 1.06 mg/dL (ref 0.50–1.35)
Sodium: 138 mEq/L (ref 135–145)

## 2012-11-30 LAB — GLUCOSE, CAPILLARY
Glucose-Capillary: 158 mg/dL — ABNORMAL HIGH (ref 70–99)
Glucose-Capillary: 159 mg/dL — ABNORMAL HIGH (ref 70–99)

## 2012-11-30 MED ORDER — LEVOFLOXACIN 500 MG PO TABS: 500.0000 mg | ORAL_TABLET | Freq: Every day | ORAL | Status: DC

## 2012-11-30 MED ORDER — ALBUTEROL SULFATE (5 MG/ML) 0.5% IN NEBU: 2.5000 mg | INHALATION_SOLUTION | Freq: Four times a day (QID) | RESPIRATORY_TRACT | Status: DC | PRN

## 2012-11-30 MED ORDER — WARFARIN SODIUM 5 MG PO TABS
5.0000 mg | ORAL_TABLET | Freq: Once | ORAL | Status: DC
  Filled 2012-11-30: qty 1

## 2012-11-30 MED ORDER — BENZONATATE 200 MG PO CAPS: 200.0000 mg | ORAL_CAPSULE | Freq: Three times a day (TID) | ORAL | Status: DC

## 2012-11-30 NOTE — Progress Notes (Signed)
ANTICOAGULATION CONSULT NOTE - Follow Up Consult  Pharmacy Consult for Coumadin Indication: atrial fibrillation  No Known Allergies  Patient Measurements: Height: 5\' 9"  (175.3 cm) Weight: 155 lb 3.2 oz (70.398 kg) (SCALE A) IBW/kg (Calculated) : 70.7  Vital Signs: Temp: 97.6 F (36.4 C) (04/29 0932) Temp src: Oral (04/29 0932) BP: 104/49 mmHg (04/29 0932) Pulse Rate: 61 (04/29 0932)  Labs:  Recent Labs  11/27/12 1405  11/27/12 2017 11/27/12 2345 11/28/12 0505 11/29/12 0655 11/30/12 0558  HGB 12.7*  --   --   --  11.9* 12.0* 11.8*  HCT 37.6*  --   --   --  35.6* 35.5* 36.0*  PLT 153  --   --   --  143* 128* 130*  LABPROT  --   < >  --   --  23.1* 22.4* 24.7*  INR  --   < >  --   --  2.15* 2.06* 2.35*  CREATININE 1.27  --   --   --  1.38* 1.08 1.06  TROPONINI <0.30  --  <0.30 <0.30  --   --   --   < > = values in this interval not displayed.  Estimated Creatinine Clearance: 64.6 ml/min (by C-G formula based on Cr of 1.06).  Assessment:  INR remains therapeutic.  Home Coumadin regimen:  5 mg daily except 7.5 mg on Mondays.   Day # 3 Levaquin (7 days planned).  Platelet count remains low. Has been <150K. No bleeding noted. Prednisone 10 mg daily x 3 doses ended this morning.   Goal of Therapy:  INR 2-3 Monitor platelets by anticoagulation protocol: Yes   Plan:   Coumadin 5 mg today. Usual Tuesday dose.  Continue daily PT/INR for now.  Watch for Levaquin effect on Coumadin.  Follow up CBC/platelet count.  Dennie Fetters, RPh Pager: [phone] 11/30/2012,11:25 AM

## 2012-11-30 NOTE — Progress Notes (Signed)
Patient ID: [name], male   DOB: 1941-01-17, 72 y.o.   MRN: [id number]   SUBJECTIVE:  The patient is feeling better. He still has a productive cough. He still has wheezing and marked rhonchi on physical exam. I had changed his diuretics from IV to by mouth. He is on the lower side of the diuretic that he supposed to take at home. With this he is still had some further diuresis. His renal function is holding stable.   Filed Vitals:   11/30/12 0500 11/30/12 0637 11/30/12 0747 11/30/12 0932  BP: 100/61   104/49  Pulse: 54 63  61  Temp: 97.5 F (36.4 C)   97.6 F (36.4 C)  TempSrc: Oral   Oral  Resp: 16   20  Height:      Weight: 155 lb 3.2 oz (70.398 kg)     SpO2: 99%  98% 98%    Intake/Output Summary (Last 24 hours) at 11/30/12 1150 Last data filed at 11/30/12 6578  Gross per 24 hour  Intake    563 ml  Output   2025 ml  Net  -1462 ml    LABS: Basic Metabolic Panel:  Recent Labs  46/96/29 0655 11/30/12 0558  NA 138 138  K 3.9 4.0  CL 102 103  CO2 28 25  GLUCOSE 226* 134*  BUN 25* 21  CREATININE 1.08 1.06  CALCIUM 8.8 8.9   Liver Function Tests: No results found for this basename: AST, ALT, ALKPHOS, BILITOT, PROT, ALBUMIN,  in the last 72 hours No results found for this basename: LIPASE, AMYLASE,  in the last 72 hours CBC:  Recent Labs  11/27/12 1405  11/29/12 0655 11/30/12 0558  WBC 4.7  < > 4.5 4.5  NEUTROABS 3.4  --   --   --   HGB 12.7*  < > 12.0* 11.8*  HCT 37.6*  < > 35.5* 36.0*  MCV 85.5  < > 85.7 86.3  PLT 153  < > 128* 130*  < > = values in this interval not displayed. Cardiac Enzymes:  Recent Labs  11/27/12 1405 11/27/12 2017 11/27/12 2345  TROPONINI <0.30 <0.30 <0.30   BNP: No components found with this basename: POCBNP,  D-Dimer: No results found for this basename: DDIMER,  in the last 72 hours Hemoglobin A1C:  Recent Labs  11/27/12 2025  HGBA1C 6.4*   Fasting Lipid Panel: No results found for this basename: CHOL, HDL,  LDLCALC, TRIG, CHOLHDL, LDLDIRECT,  in the last 72 hours Thyroid Function Tests: No results found for this basename: TSH, T4TOTAL, FREET3, T3FREE, THYROIDAB,  in the last 72 hours  RADIOLOGY: Dg Chest 2 View  11/27/2012  *RADIOLOGY REPORT*  Clinical Data: Shortness of breath and cough.  CHEST - 2 VIEW  Comparison: None  Findings: Cardiomegaly and CABG changes noted. COPD/emphysema noted. There is no evidence of focal airspace disease, pulmonary edema, suspicious pulmonary nodule/mass, pleural effusion, or pneumothorax. No acute bony abnormalities are identified.  IMPRESSION: No evidence of acute cardiopulmonary disease.  Cardiomegaly and COPD/emphysema.   Original Report Authenticated By: Harmon Pier, M.D.    US Abdomen Complete  11/04/2012  *RADIOLOGY REPORT*  Clinical Data:  Enlarged liver on physical exam, post prandial nausea  COMPLETE ABDOMINAL ULTRASOUND  Comparison:  None.  Findings:  Gallbladder:  There is mobile gallbladder sludge versus tiny nonshadowing gallstones.  The gallbladder wall is somewhat prominent measuring 3.4 mm diffusely.  This can be seen with hypoproteinemia.  No pain is present with compression  over the gallbladder.  There is a small amount of fluid adjacent to the gallbladder.  Common bile duct:  The common bile duct is normal measuring 5.1 mm in diameter.  Liver:  The liver is prominent measuring 19.7 cm sagittally.  No focal hepatic abnormality is seen.  IVC:  Appears normal.  Pancreas:  The tail of the pancreas is obscured by bowel gas.  Spleen:  The spleen is enlarged measuring 14.0 cm sagittally with a volume of 756 ml.  Right Kidney:  No hydronephrosis is seen.  The right kidney measures 11.7 cm sagittally.  Left Kidney:  No hydronephrosis is noted.  The left kidney measures 13.2 cm.  Abdominal aorta:  It the abdominal aorta is normal in caliber with atheromatous change present.  IMPRESSION:  1.  Splenomegaly. 2.  Thickened gallbladder wall diffusely may indicate  hypoproteinemia.  No present evidence of acute cholecystitis is seen.  Correlate with liver function tests. 3.  Small amount of gallbladder sludge or nonshadowing gallstones. 4.  Hepatomegaly. 5.  The tail of the pancreas is obscured by bowel gas.   Original Report Authenticated By: Dwyane Dee, M.D.     PHYSICAL EXAM  Patient is oriented to person time and place. Affect is normal. I'm not sure how much he understands his cardiac disease process. He will need continuing education. He is comfortable lying flat in bed. Physical exam reveals no jugulovenous distention. Lung exam reveals diffuse rhonchi and continued and expiratory wheezes. There is no respiratory distress. Cardiac exam reveals S1 and S2. The abdomen is soft. He has no significant peripheral edema at this time.   TELEMETRY: I have reviewed telemetry today November 30, 2012. There is normal sinus rhythm.   ASSESSMENT AND PLAN:    Bronchitis     I do not have a good understanding of what he continues to have such marked physical findings of his rhonchi and wheezing. However clinically he is improving.    Cardiomyopathy    H/O amiodarone therapy   Hx of CABG    Acute on chronic systolic CHF (congestive heart failure)      The major illness this admission is his bronchitis. He did have a component of volume overload. He diuresis with IV diuretics and then further when switched to his oral diuretics. His renal function needs to be followed to be sure he does not become too dry. However he is on the lower side of the home oral diuretics that he has been taking.    Type II or unspecified type diabetes mellitus without mention of complication, uncontrolled   Willa Rough 11/30/2012 11:50 AM

## 2012-11-30 NOTE — Plan of Care (Signed)
Problem: Phase I Progression Outcomes Goal: EF % per last Echo/documented,Core Reminder form on chart Outcome: Completed/Met Date Met:  11/30/12 EF 25-30% per echo on 04/13/2012 Goal: Up in chair, BRP Outcome: Completed/Met Date Met:  11/30/12 Pt up ad lib in room

## 2012-11-30 NOTE — Care Management Note (Signed)
    Page 1 of 1   11/30/2012     3:32:02 PM   CARE MANAGEMENT NOTE 11/30/2012  Patient:  [name], [name]   Account Number:  [id number]  Date Initiated:  11/30/2012  Documentation initiated by:  [facility]  Subjective/Objective Assessment:   72 y.o. male presents with with a couple days history of cough, SOB and sputum production along with clear eye drainage and sinus congestion.     Action/Plan:   IV steroids/ home with nebulizer   Anticipated DC Date:  11/30/2012   Anticipated DC Plan:  HOME/SELF CARE      DC Planning Services  CM consult      PAC Choice  DURABLE MEDICAL EQUIPMENT   Choice offered to / List presented to:     DME arranged  NEBULIZER MACHINE      DME agency  [facility]        Status of service:   Medicare Important Message given?   (If response is "NO", the following Medicare IM given date fields will be blank) Date Medicare IM given:   Date Additional Medicare IM given:    Discharge Disposition:    Per UR Regulation:  Reviewed for med. necessity/level of care/duration of stay  If discussed at Long Length of Stay Meetings, dates discussed:    Comments:  11/30/12 @ 1515.Marland KitchenMarland KitchenOletta Cohn, RN, BSN, [place] [phone] Obtained order from Dr. Brien Few to arrange for home nebulizer for pt.  CM notified [facility] of pt needs.  CM spoke with pt to access for further dischage needs.  None identified at this time.

## 2012-12-01 LAB — GLUCOSE, CAPILLARY: Glucose-Capillary: 105 mg/dL — ABNORMAL HIGH (ref 70–99)

## 2012-12-02 ENCOUNTER — Telehealth: Payer: Self-pay | Admitting: Internal Medicine

## 2012-12-02 NOTE — Telephone Encounter (Signed)
Spoke to patient Home finally  On steroid and nebulizer now Feels better with easy breathing Will set up follow up appt soon  Lyla Son, Please set him up with hospital follow up in 1-2 weeks

## 2012-12-16 ENCOUNTER — Ambulatory Visit (INDEPENDENT_AMBULATORY_CARE_PROVIDER_SITE_OTHER): Payer: Medicare Other | Admitting: Internal Medicine

## 2012-12-16 ENCOUNTER — Encounter: Payer: Self-pay | Admitting: Internal Medicine

## 2012-12-16 VITALS — BP 94/62 | HR 64 | Temp 97.8°F | Wt 154.2 lb

## 2012-12-16 DIAGNOSIS — J449 Chronic obstructive pulmonary disease, unspecified: Secondary | ICD-10-CM | POA: Insufficient documentation

## 2012-12-16 DIAGNOSIS — I5022 Chronic systolic (congestive) heart failure: Secondary | ICD-10-CM

## 2012-12-16 DIAGNOSIS — J209 Acute bronchitis, unspecified: Secondary | ICD-10-CM

## 2012-12-16 DIAGNOSIS — E1059 Type 1 diabetes mellitus with other circulatory complications: Secondary | ICD-10-CM

## 2012-12-16 NOTE — Assessment & Plan Note (Signed)
Infection seems to be the reason for the recent hospitalization  Better now

## 2012-12-16 NOTE — Assessment & Plan Note (Signed)
Seems to be at his dry weight Rare PND and has sig DOE (multifactorial) No changes now

## 2012-12-16 NOTE — Assessment & Plan Note (Signed)
Some degree of lung disease Nebs have helped

## 2012-12-16 NOTE — Assessment & Plan Note (Signed)
Good control without hypoglycemia now

## 2012-12-16 NOTE — Progress Notes (Signed)
Subjective:    Patient ID: [name], male    DOB: 06-23-1941, 72 y.o.   MRN: [id number]  HPI Feels better Cough is better--mild and only a little mucus Feels he has post nasal drip--can make him choke Done with prednisone Has been using the nebulizer--up to tid (on bad days). Less often now  No chest pain Some edema--clearly better and pretty much gone in the past week Still gets easy DOE---short burst of fast walking. Can shower without dyspnea Able to do instrumental ADLs--but has managed to do them  Current Outpatient Prescriptions on File Prior to Visit  Medication Sig Dispense Refill  . albuterol (PROVENTIL) (5 MG/ML) 0.5% nebulizer solution Take 0.5 mLs (2.5 mg total) by nebulization every 6 (six) hours as needed for wheezing or shortness of breath.  20 mL  1  . amiodarone (PACERONE) 200 MG tablet Take 200 mg by mouth daily.      Marland Kitchen aspirin EC 81 MG tablet Take 81 mg by mouth daily.      Marland Kitchen atorvastatin (LIPITOR) 20 MG tablet Take 20 mg by mouth at bedtime.       . benzonatate (TESSALON) 200 MG capsule Take 1 capsule (200 mg total) by mouth 3 (three) times daily.  20 capsule  0  . carvedilol (COREG) 6.25 MG tablet Take 1 tablet (6.25 mg total) by mouth 2 (two) times daily with a meal.  60 tablet  5  . cholecalciferol (VITAMIN D) 1000 UNITS tablet Take 1,000 Units by mouth daily.      Tery Sanfilippo Calcium (STOOL SOFTENER PO) Take 1 capsule by mouth 2 (two) times daily.      Marland Kitchen glucose blood (RELION PRIME TEST) test strip Test blood sugar 3-5 times daily, dx: 250.00  200 each  11  . insulin detemir (LEVEMIR) 100 UNIT/ML injection Inject 0.2 mLs (20 Units total) into the skin daily before breakfast.  20 mL  1  . Insulin Syringe-Needle U-100 (BD INSULIN SYRINGE ULTRAFINE) 31G X 5/16" 1 ML MISC USE AS DIRECTED  100 each  11  . isosorbide mononitrate (IMDUR) 30 MG 24 hr tablet Take 30 mg by mouth at bedtime.      Marland Kitchen loratadine (CLARITIN) 10 MG tablet Take 10 mg by mouth 2 (two) times  daily as needed for allergies.      Marland Kitchen LORazepam (ATIVAN) 0.5 MG tablet Take 0.5 mg by mouth at bedtime as needed (for sleep).       . Multiple Vitamin (MULTIVITAMIN WITH MINERALS) TABS Take 1 tablet by mouth daily.      . Niacin (VITAMIN B-3 PO) Take 1 tablet by mouth daily.      . Omega-3 Fatty Acids (FISH OIL) 1000 MG CAPS Take 1,000 mg by mouth daily.      . potassium chloride (K-DUR) 10 MEQ tablet Take 1 tablet (10 mEq total) by mouth 2 (two) times daily.  60 tablet  6  . quinapril (ACCUPRIL) 10 MG tablet Take 10 mg by mouth at bedtime.      . sitaGLIPtin (JANUVIA) 100 MG tablet Take 1 tablet (100 mg total) by mouth daily.  90 tablet  1  . torsemide (DEMADEX) 20 MG tablet Take 20-40 mg by mouth 2 (two) times daily. 1-2 tablets - depending on amount of fluid retention      . triamcinolone (KENALOG) 0.1 % cream Apply 1 application topically 2 (two) times daily as needed (for scaling (eczema) under eyes).       Marland Kitchen  vitamin E 400 UNIT capsule Take 400 Units by mouth daily.      Marland Kitchen warfarin (COUMADIN) 5 MG tablet Take 5-7.5 mg by mouth every evening. Take 1 1/2 tablets (7.5 mg) on Monday nights, take 1 tablet (5 mg) on all other days       No current facility-administered medications on file prior to visit.    No Known Allergies  Past Medical History  Diagnosis Date  . Atrial fibrillation      S/P ablation  . CAD (coronary artery disease)     S/P MI 1980's----------------------Dr McDowell  . Hyperlipidemia   . CHF (congestive heart failure)   . 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 BPH (benign prostatic hypertrophy)   . Stroke   . Sleep disturbance     Past Surgical History  Procedure Laterality Date  . Coronary artery bypass graft    . Appendectomy    . Tonsillectomy    . Upper gastrointestinal endoscopy      Gastritis , ? H. pylori 08/28/1999  . Atrial ablation surgery      Family History  Problem Relation Age of Onset  . Parkinson's disease  Mother   . Heart disease Father   . Skin cancer Sister   . Heart attack Mother     History   Social History  . Marital Status: Single    Spouse Name: N/A    Number of Children: N/A  . Years of Education: N/A   Occupational History  . disabled due to heart disease    Social History Main Topics  . Smoking status: Former Smoker -- 1.00 packs/day    Types: Cigarettes    Quit date: 07/15/1973  . Smokeless tobacco: Never Used  . Alcohol Use: No  . Drug Use: No  . Sexually Active: Not on file   Other Topics Concern  . Not on file   Social History Narrative   Single--lives alone   Stays with sister with Altzheimer's frequently   Has living will   Brother, Gerlene Burdock has health care POA   Would accept resuscitation but no prolonged artificial ventilation   No feeding tube if cognitively unaware            Review of Systems Having some trouble with concentration. Plans to get meds bubble wrapped at [facility]. Sleeping okay for last 2-3 nights. In bed and flat. Rare PND--will move to recliner Appetite is good    Objective:   Physical Exam  Constitutional: He appears well-developed. No distress.  Neck: Normal range of motion. Neck supple. No thyromegaly present.  Cardiovascular: Normal rate and regular rhythm.  Exam reveals no gallop.   Distant sounds and subxiphoid ?soft systolic murmur  Pulmonary/Chest: Effort normal. No respiratory distress. He has no wheezes. He has rales.  Mildly decreased breath sounds Fine early inspiratory crackles in both bases  Musculoskeletal: He exhibits no edema and no tenderness.  Lymphadenopathy:    He has no cervical adenopathy.  Psychiatric: He has a normal mood and affect.          Assessment & Plan:

## 2012-12-22 ENCOUNTER — Other Ambulatory Visit: Payer: Self-pay | Admitting: *Deleted

## 2012-12-22 MED ORDER — TORSEMIDE 20 MG PO TABS: 20.0000 mg | ORAL_TABLET | Freq: Two times a day (BID) | ORAL | Status: DC

## 2012-12-22 NOTE — Telephone Encounter (Signed)
Refilled Torsemide 20 mg sent to [facility].

## 2012-12-23 ENCOUNTER — Ambulatory Visit: Payer: Medicare Other

## 2012-12-30 ENCOUNTER — Ambulatory Visit (INDEPENDENT_AMBULATORY_CARE_PROVIDER_SITE_OTHER): Payer: Medicare Other | Admitting: Family Medicine

## 2012-12-30 DIAGNOSIS — G459 Transient cerebral ischemic attack, unspecified: Secondary | ICD-10-CM

## 2012-12-30 DIAGNOSIS — I4891 Unspecified atrial fibrillation: Secondary | ICD-10-CM

## 2012-12-30 DIAGNOSIS — Z5181 Encounter for therapeutic drug level monitoring: Secondary | ICD-10-CM

## 2012-12-30 DIAGNOSIS — Z7901 Long term (current) use of anticoagulants: Secondary | ICD-10-CM

## 2012-12-30 NOTE — Patient Instructions (Signed)
Continue to take 1 tablet every day except take 1 1/2 tablets on Monday.  Re-check in 6 weeks.

## 2013-02-10 ENCOUNTER — Ambulatory Visit (INDEPENDENT_AMBULATORY_CARE_PROVIDER_SITE_OTHER): Payer: Self-pay | Admitting: Family Medicine

## 2013-02-10 DIAGNOSIS — G459 Transient cerebral ischemic attack, unspecified: Secondary | ICD-10-CM

## 2013-02-10 DIAGNOSIS — Z5181 Encounter for therapeutic drug level monitoring: Secondary | ICD-10-CM

## 2013-02-10 DIAGNOSIS — Z7901 Long term (current) use of anticoagulants: Secondary | ICD-10-CM

## 2013-02-10 DIAGNOSIS — I4891 Unspecified atrial fibrillation: Secondary | ICD-10-CM

## 2013-02-10 NOTE — Patient Instructions (Addendum)
Continue to take 1 tablet every day except take 1 1/2 tablets on Monday.  Re-check in 6 weeks. 

## 2013-02-17 ENCOUNTER — Encounter: Payer: Self-pay | Admitting: Internal Medicine

## 2013-02-17 ENCOUNTER — Ambulatory Visit (INDEPENDENT_AMBULATORY_CARE_PROVIDER_SITE_OTHER): Payer: Medicare Other | Admitting: Internal Medicine

## 2013-02-17 VITALS — BP 110/60 | HR 96 | Temp 98.4°F | Wt 175.0 lb

## 2013-02-17 DIAGNOSIS — E1159 Type 2 diabetes mellitus with other circulatory complications: Secondary | ICD-10-CM

## 2013-02-17 DIAGNOSIS — I5023 Acute on chronic systolic (congestive) heart failure: Secondary | ICD-10-CM | POA: Insufficient documentation

## 2013-02-17 NOTE — Assessment & Plan Note (Signed)
Still seems to have good control Will check with blood work next week

## 2013-02-17 NOTE — Progress Notes (Signed)
Subjective:    Patient ID: [name], male    DOB: [birth year], 72 y.o.   MRN: [id number]  HPI Not doing so well Weight is up Getting SOB very easily  Has been weighing himself but he was afraid to take----sister told her it can affect her kidneys Has been taking just 1 a day---and occasionally 2 a day DOE just bending, walking to the bathroom Not sleeping well--has flat bed (recliner in past). Has some PND More edema Goes back a while and now worsening  Checks sugars tid now Discussed that once a day is enough Runs low fasting-- as low as 80's but no low sugar reactions  Current Outpatient Prescriptions on File Prior to Visit  Medication Sig Dispense Refill  . albuterol (PROVENTIL) (5 MG/ML) 0.5% nebulizer solution Take 0.5 mLs (2.5 mg total) by nebulization every 6 (six) hours as needed for wheezing or shortness of breath.  20 mL  1  . amiodarone (PACERONE) 200 MG tablet Take 200 mg by mouth daily.      Marland Kitchen aspirin EC 81 MG tablet Take 81 mg by mouth daily.      Marland Kitchen atorvastatin (LIPITOR) 20 MG tablet Take 20 mg by mouth at bedtime.       . carvedilol (COREG) 6.25 MG tablet Take 1 tablet (6.25 mg total) by mouth 2 (two) times daily with a meal.  60 tablet  5  . cholecalciferol (VITAMIN D) 1000 UNITS tablet Take 1,000 Units by mouth daily.      Tery Sanfilippo Calcium (STOOL SOFTENER PO) Take 1 capsule by mouth 2 (two) times daily.      Marland Kitchen glucose blood (RELION PRIME TEST) test strip Test blood sugar 3-5 times daily, dx: 250.00  200 each  11  . insulin detemir (LEVEMIR) 100 UNIT/ML injection Inject 0.2 mLs (20 Units total) into the skin daily before breakfast.  20 mL  1  . Insulin Syringe-Needle U-100 (BD INSULIN SYRINGE ULTRAFINE) 31G X 5/16" 1 ML MISC USE AS DIRECTED  100 each  11  . isosorbide mononitrate (IMDUR) 30 MG 24 hr tablet Take 30 mg by mouth at bedtime.      Marland Kitchen loratadine (CLARITIN) 10 MG tablet Take 10 mg by mouth 2 (two) times daily as needed for allergies.      Marland Kitchen  LORazepam (ATIVAN) 0.5 MG tablet Take 0.5 mg by mouth at bedtime as needed (for sleep).       . Multiple Vitamin (MULTIVITAMIN WITH MINERALS) TABS Take 1 tablet by mouth daily.      . Niacin (VITAMIN B-3 PO) Take 1 tablet by mouth daily.      . Omega-3 Fatty Acids (FISH OIL) 1000 MG CAPS Take 1,000 mg by mouth daily.      . quinapril (ACCUPRIL) 10 MG tablet Take 10 mg by mouth at bedtime.      . sitaGLIPtin (JANUVIA) 100 MG tablet Take 1 tablet (100 mg total) by mouth daily.  90 tablet  1  . torsemide (DEMADEX) 20 MG tablet Take 1-2 tablets (20-40 mg total) by mouth 2 (two) times daily. -depending on amount of fluid retention  60 tablet  3  . triamcinolone (KENALOG) 0.1 % cream Apply 1 application topically 2 (two) times daily as needed (for scaling (eczema) under eyes).       . vitamin E 400 UNIT capsule Take 400 Units by mouth daily.      Marland Kitchen warfarin (COUMADIN) 5 MG tablet Take 1 1/2 tablets (7.5 mg) on  Monday nights, take 1 tablet (5 mg) on all other days       No current facility-administered medications on file prior to visit.    No Known Allergies  Past Medical History  Diagnosis Date  . Atrial fibrillation      S/P ablation  . CAD (coronary artery disease)     S/P MI 1980's----------------------Dr McDowell  . Hyperlipidemia   . CHF (congestive heart failure)   . 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 BPH (benign prostatic hypertrophy)   . Stroke   . Sleep disturbance     Past Surgical History  Procedure Laterality Date  . Coronary artery bypass graft    . Appendectomy    . Tonsillectomy    . Upper gastrointestinal endoscopy      Gastritis , ? H. pylori 08/28/1999  . Atrial ablation surgery      Family History  Problem Relation Age of Onset  . Parkinson's disease Mother   . Heart disease Father   . Skin cancer Sister   . Heart attack Mother     History   Social History  . Marital Status: Single    Spouse Name: N/A    Number of  Children: N/A  . Years of Education: N/A   Occupational History  . disabled due to heart disease    Social History Main Topics  . Smoking status: Former Smoker -- 1.00 packs/day    Types: Cigarettes    Quit date: 07/15/1973  . Smokeless tobacco: Never Used  . Alcohol Use: No  . Drug Use: No  . Sexually Active: Not on file   Other Topics Concern  . Not on file   Social History Narrative   Single--lives alone   Stays with sister with Altzheimer's frequently   Has living will   Brother, Gerlene Burdock has health care POA   Would accept resuscitation but no prolonged artificial ventilation   No feeding tube if cognitively unaware            Review of Systems Appetite is off--still eats though Bowels have been okay    Objective:   Physical Exam  Constitutional: He appears well-developed and well-nourished.  Neck: Normal range of motion. No thyromegaly present.  Cardiovascular: Normal rate and regular rhythm.  Exam reveals no gallop.   Murmur heard. Soft systolic murmur  Pulmonary/Chest: No respiratory distress. He has no wheezes. He has rales.  No dullness but breath sounds are muffled and bilateral crackles up almost half way  Musculoskeletal:  1+ non pitting calf edema  Lymphadenopathy:    He has no cervical adenopathy.  Psychiatric: He has a normal mood and affect. His behavior is normal.          Assessment & Plan:

## 2013-02-17 NOTE — Assessment & Plan Note (Signed)
He has been underusing his diuretics due to fear for his kidneys Discussed that his ideal weight is at least 15# less and he needs to monitor  For now, increase torsemide to 2 bid Recheck in 4 days ER if worsens

## 2013-02-17 NOTE — Patient Instructions (Addendum)
Please take 2 of your torsemide (total of 40mg ) twice a day till you come back in on Monday.  Take 2 tonight as soon as you get home Weight yourself every day and bring in the numbers at the visit Call 911 if your breathing gets worse

## 2013-02-21 ENCOUNTER — Ambulatory Visit (INDEPENDENT_AMBULATORY_CARE_PROVIDER_SITE_OTHER): Payer: Medicare Other | Admitting: Internal Medicine

## 2013-02-21 ENCOUNTER — Encounter: Payer: Self-pay | Admitting: Internal Medicine

## 2013-02-21 VITALS — BP 100/50 | HR 60 | Temp 97.9°F | Wt 169.0 lb

## 2013-02-21 DIAGNOSIS — I5023 Acute on chronic systolic (congestive) heart failure: Secondary | ICD-10-CM

## 2013-02-21 MED ORDER — SPIRONOLACTONE 25 MG PO TABS: 25.0000 mg | ORAL_TABLET | Freq: Every day | ORAL | Status: DC

## 2013-02-21 NOTE — Patient Instructions (Signed)
Weigh yourself daily and bring in the numbers to your next visit. Take the torsemide once a day 40mg  Add the new medication spironolactone daily Stop the potassium

## 2013-02-21 NOTE — Assessment & Plan Note (Signed)
Improved but still not close to baseline weight Will add low dose spironolactone

## 2013-02-21 NOTE — Progress Notes (Signed)
Subjective:    Patient ID: [name], male    DOB: February 03, 1941, 72 y.o.   MRN: [id number]  HPI  Here with sister Feels better  Breathing is better Has been weighing himself daily---didn't bring in numbers Can lie in bed---still occasionally has PND  Has spot on leg he is concerned about  Current Outpatient Prescriptions on File Prior to Visit  Medication Sig Dispense Refill  . albuterol (PROVENTIL) (5 MG/ML) 0.5% nebulizer solution Take 0.5 mLs (2.5 mg total) by nebulization every 6 (six) hours as needed for wheezing or shortness of breath.  20 mL  1  . aspirin EC 81 MG tablet Take 81 mg by mouth daily.      Marland Kitchen atorvastatin (LIPITOR) 20 MG tablet Take 20 mg by mouth at bedtime.       . carvedilol (COREG) 6.25 MG tablet Take 1 tablet (6.25 mg total) by mouth 2 (two) times daily with a meal.  60 tablet  5  . cholecalciferol (VITAMIN D) 1000 UNITS tablet Take 1,000 Units by mouth daily.      Tery Sanfilippo Calcium (STOOL SOFTENER PO) Take 1 capsule by mouth 2 (two) times daily.      Marland Kitchen glucose blood (RELION PRIME TEST) test strip Test blood sugar 3-5 times daily, dx: 250.00  200 each  11  . insulin detemir (LEVEMIR) 100 UNIT/ML injection Inject 0.2 mLs (20 Units total) into the skin daily before breakfast.  20 mL  1  . Insulin Syringe-Needle U-100 (BD INSULIN SYRINGE ULTRAFINE) 31G X 5/16" 1 ML MISC USE AS DIRECTED  100 each  11  . isosorbide mononitrate (IMDUR) 30 MG 24 hr tablet Take 30 mg by mouth at bedtime.      Marland Kitchen loratadine (CLARITIN) 10 MG tablet Take 10 mg by mouth 2 (two) times daily as needed for allergies.      Marland Kitchen LORazepam (ATIVAN) 0.5 MG tablet Take 0.5-1 mg by mouth at bedtime as needed (for sleep).       . Multiple Vitamin (MULTIVITAMIN WITH MINERALS) TABS Take 1 tablet by mouth daily.      . Niacin (VITAMIN B-3 PO) Take 1 tablet by mouth daily.      . Omega-3 Fatty Acids (FISH OIL) 1000 MG CAPS Take 1,000 mg by mouth daily.      . potassium chloride (K-DUR,KLOR-CON) 10 MEQ  tablet Take 10 mEq by mouth 2 (two) times daily.       . quinapril (ACCUPRIL) 10 MG tablet Take 10 mg by mouth at bedtime.      . sitaGLIPtin (JANUVIA) 100 MG tablet Take 1 tablet (100 mg total) by mouth daily.  90 tablet  1  . torsemide (DEMADEX) 20 MG tablet Take 1-2 tablets (20-40 mg total) by mouth 2 (two) times daily. -depending on amount of fluid retention  60 tablet  3  . vitamin E 400 UNIT capsule Take 400 Units by mouth daily.      Marland Kitchen warfarin (COUMADIN) 5 MG tablet as directed.        No current facility-administered medications on file prior to visit.    No Known Allergies  Past Medical History  Diagnosis Date  . Atrial fibrillation      S/P ablation  . CAD (coronary artery disease)     S/P MI 1980's----------------------Dr McDowell  . Hyperlipidemia   . CHF (congestive heart failure)   . 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 BPH (benign prostatic hypertrophy)   .  Stroke   . Sleep disturbance     Past Surgical History  Procedure Laterality Date  . Coronary artery bypass graft    . Appendectomy    . Tonsillectomy    . Upper gastrointestinal endoscopy      Gastritis , ? H. pylori 08/28/1999  . Atrial ablation surgery      Family History  Problem Relation Age of Onset  . Parkinson's disease Mother   . Heart disease Father   . Skin cancer Sister   . Heart attack Mother     History   Social History  . Marital Status: Single    Spouse Name: N/A    Number of Children: N/A  . Years of Education: N/A   Occupational History  . disabled due to heart disease    Social History Main Topics  . Smoking status: Former Smoker -- 1.00 packs/day    Types: Cigarettes    Quit date: 07/15/1973  . Smokeless tobacco: Never Used  . Alcohol Use: No  . Drug Use: No  . Sexually Active: Not on file   Other Topics Concern  . Not on file   Social History Narrative   Single--lives alone   Stays with sister with Altzheimer's frequently    Has living will   Brother, Gerlene Burdock has health care POA   Would accept resuscitation but no prolonged artificial ventilation   No feeding tube if cognitively unaware            Review of Systems Appetite is okay Bowels okay     Objective:   Physical Exam  Constitutional: He appears well-developed. No distress.  Neck: Normal range of motion. Neck supple.  Cardiovascular: Normal rate and regular rhythm.  Exam reveals no gallop.   Murmur heard. Soft systolic murmur  Pulmonary/Chest: Effort normal. No respiratory distress. He has no wheezes. He has rales.  Aeration better at left base---still mild crackles  Musculoskeletal: He exhibits edema.  2+ edema---calves  Lymphadenopathy:    He has no cervical adenopathy.  Skin:  Venous stasis changes Water blister and mild seeping lower right calf          Assessment & Plan:

## 2013-02-22 LAB — BASIC METABOLIC PANEL
BUN: 37 mg/dL — ABNORMAL HIGH (ref 6–23)
Calcium: 9.2 mg/dL (ref 8.4–10.5)
Creatinine, Ser: 1.4 mg/dL (ref 0.4–1.5)
GFR: 51.79 mL/min — ABNORMAL LOW (ref >60.00)
Glucose, Bld: 142 mg/dL — ABNORMAL HIGH (ref 70–99)

## 2013-03-03 ENCOUNTER — Encounter: Payer: Self-pay | Admitting: Internal Medicine

## 2013-03-03 ENCOUNTER — Ambulatory Visit (INDEPENDENT_AMBULATORY_CARE_PROVIDER_SITE_OTHER): Payer: Medicare Other | Admitting: Internal Medicine

## 2013-03-03 VITALS — BP 110/60 | HR 77 | Temp 98.0°F | Wt 172.0 lb

## 2013-03-03 DIAGNOSIS — I5023 Acute on chronic systolic (congestive) heart failure: Secondary | ICD-10-CM

## 2013-03-03 NOTE — Patient Instructions (Signed)
Please take the torsemide 2 tablets (40mg ) twice a day---morning and afternoon. Don't take the afternoon dose once your weight goes below 160# at home Increase the spironolactone to 25mg  (1 tab) twice a day

## 2013-03-03 NOTE — Progress Notes (Signed)
Subjective:    Patient ID: [name], male    DOB: 06/17/1941, 72 y.o.   MRN: [id number]  HPI Not doing so great Weight went back up a bit Taking only 20mg  of the torsemide instead of 2--though twice a day  Has been weighing himself daily 168-172# (was 170# today)  Breathing has been rough also Sleeping in bed sometimes---other times in the bed (with his head propped up) Edema is bad or worse  Current Outpatient Prescriptions on File Prior to Visit  Medication Sig Dispense Refill  . albuterol (PROVENTIL) (5 MG/ML) 0.5% nebulizer solution Take 0.5 mLs (2.5 mg total) by nebulization every 6 (six) hours as needed for wheezing or shortness of breath.  20 mL  1  . amiodarone (PACERONE) 200 MG tablet Take 200 mg by mouth daily.      Marland Kitchen aspirin EC 81 MG tablet Take 81 mg by mouth daily.      Marland Kitchen atorvastatin (LIPITOR) 20 MG tablet Take 20 mg by mouth at bedtime.       . carvedilol (COREG) 6.25 MG tablet Take 1 tablet (6.25 mg total) by mouth 2 (two) times daily with a meal.  60 tablet  5  . cholecalciferol (VITAMIN D) 1000 UNITS tablet Take 1,000 Units by mouth daily.      Tery Sanfilippo Calcium (STOOL SOFTENER PO) Take 1 capsule by mouth 2 (two) times daily.      Marland Kitchen glucose blood (RELION PRIME TEST) test strip Test blood sugar 3-5 times daily, dx: 250.00  200 each  11  . insulin detemir (LEVEMIR) 100 UNIT/ML injection Inject 0.2 mLs (20 Units total) into the skin daily before breakfast.  20 mL  1  . Insulin Syringe-Needle U-100 (BD INSULIN SYRINGE ULTRAFINE) 31G X 5/16" 1 ML MISC USE AS DIRECTED  100 each  11  . isosorbide mononitrate (IMDUR) 30 MG 24 hr tablet Take 30 mg by mouth at bedtime.      Marland Kitchen loratadine (CLARITIN) 10 MG tablet Take 10 mg by mouth 2 (two) times daily as needed for allergies.      Marland Kitchen LORazepam (ATIVAN) 0.5 MG tablet Take 0.5-1 mg by mouth at bedtime as needed (for sleep).       . Multiple Vitamin (MULTIVITAMIN WITH MINERALS) TABS Take 1 tablet by mouth daily.      .  Niacin (VITAMIN B-3 PO) Take 1 tablet by mouth daily.      . Omega-3 Fatty Acids (FISH OIL) 1000 MG CAPS Take 1,000 mg by mouth daily.      . quinapril (ACCUPRIL) 10 MG tablet Take 10 mg by mouth at bedtime.      . sitaGLIPtin (JANUVIA) 100 MG tablet Take 1 tablet (100 mg total) by mouth daily.  90 tablet  1  . spironolactone (ALDACTONE) 25 MG tablet Take 1 tablet (25 mg total) by mouth daily.  30 tablet  3  . torsemide (DEMADEX) 20 MG tablet Take 40 mg by mouth daily. -depending on amount of fluid retention      . vitamin E 400 UNIT capsule Take 400 Units by mouth daily.      Marland Kitchen warfarin (COUMADIN) 5 MG tablet as directed.        No current facility-administered medications on file prior to visit.    No Known Allergies  Past Medical History  Diagnosis Date  . Atrial fibrillation      S/P ablation  . CAD (coronary artery disease)     S/P MI 1980's----------------------Dr  McDowell  . Hyperlipidemia   . CHF (congestive heart failure)   . 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 BPH (benign prostatic hypertrophy)   . Stroke   . Sleep disturbance     Past Surgical History  Procedure Laterality Date  . Coronary artery bypass graft    . Appendectomy    . Tonsillectomy    . Upper gastrointestinal endoscopy      Gastritis , ? H. pylori 08/28/1999  . Atrial ablation surgery      Family History  Problem Relation Age of Onset  . Parkinson's disease Mother   . Heart disease Father   . Skin cancer Sister   . Heart attack Mother     History   Social History  . Marital Status: Single    Spouse Name: N/A    Number of Children: N/A  . Years of Education: N/A   Occupational History  . disabled due to heart disease    Social History Main Topics  . Smoking status: Former Smoker -- 1.00 packs/day    Types: Cigarettes    Quit date: 07/15/1973  . Smokeless tobacco: Never Used  . Alcohol Use: No  . Drug Use: No  . Sexually Active: Not on file    Other Topics Concern  . Not on file   Social History Narrative   Single--lives alone   Stays with sister with Altzheimer's frequently   Has living will   Brother, Gerlene Burdock has health care POA   Would accept resuscitation but no prolonged artificial ventilation   No feeding tube if cognitively unaware            Review of Systems Appetite is off Some weeping from right calf     Objective:   Physical Exam  Constitutional: He appears well-developed. No distress.  Cardiovascular: Normal rate and regular rhythm.  Exam reveals no gallop.   Murmur heard. Soft systolic murmur  Pulmonary/Chest: Effort normal. No respiratory distress. He has no wheezes. He has rales.  Bibasilar crackles  Musculoskeletal: He exhibits edema.  2+ tense edema in both calves  Skin:  Stasis changes in calves but not infected          Assessment & Plan:

## 2013-03-03 NOTE — Assessment & Plan Note (Signed)
Not better due to incorrect diuretic dosing He is functionally illiterate---I offered home health nurse and he strongly doesn't want this Has sister who can read his directions but he has been reluctant to ask for help Reviewed my changes and he understands He will review at home with his sister  Will use the torsemide 40mg  bid unless his weight gets under 160# Increase the spironolactone to 50mg  daily Labs now and next time Will try low dose ACEI if BP stays up when diuresis adequate

## 2013-03-04 LAB — BASIC METABOLIC PANEL
Calcium: 8.9 mg/dL (ref 8.4–10.5)
Creatinine, Ser: 1.5 mg/dL (ref 0.4–1.5)

## 2013-03-14 ENCOUNTER — Encounter: Payer: Self-pay | Admitting: Internal Medicine

## 2013-03-14 ENCOUNTER — Other Ambulatory Visit: Payer: Self-pay | Admitting: *Deleted

## 2013-03-14 ENCOUNTER — Ambulatory Visit (INDEPENDENT_AMBULATORY_CARE_PROVIDER_SITE_OTHER): Payer: Medicare Other | Admitting: Internal Medicine

## 2013-03-14 VITALS — BP 120/60 | HR 60 | Temp 98.2°F | Wt 156.0 lb

## 2013-03-14 DIAGNOSIS — I5022 Chronic systolic (congestive) heart failure: Secondary | ICD-10-CM

## 2013-03-14 DIAGNOSIS — G589 Mononeuropathy, unspecified: Secondary | ICD-10-CM

## 2013-03-14 DIAGNOSIS — I4891 Unspecified atrial fibrillation: Secondary | ICD-10-CM

## 2013-03-14 MED ORDER — "INSULIN SYRINGE-NEEDLE U-100 31G X 5/16"" 1 ML MISC": Status: DC

## 2013-03-14 MED ORDER — SPIRONOLACTONE 25 MG PO TABS: 25.0000 mg | ORAL_TABLET | Freq: Two times a day (BID) | ORAL | Status: DC

## 2013-03-14 MED ORDER — SPIRONOLACTONE 25 MG PO TABS: 50.0000 mg | ORAL_TABLET | Freq: Every day | ORAL | Status: DC

## 2013-03-14 MED ORDER — ALBUTEROL SULFATE (5 MG/ML) 0.5% IN NEBU: 2.5000 mg | INHALATION_SOLUTION | Freq: Four times a day (QID) | RESPIRATORY_TRACT | Status: DC | PRN

## 2013-03-14 NOTE — Progress Notes (Signed)
Subjective:    Patient ID: [name], male    DOB: [birth year], 72 y.o.   MRN: [id number]  HPI Feels the fluid pills have worked Weight way down-- did cut down on the torsemide  Edema is almost gone Breathing is better but still gets DOE--like walking around the supermarket No chest pain  Current Outpatient Prescriptions on File Prior to Visit  Medication Sig Dispense Refill  . amiodarone (PACERONE) 200 MG tablet Take 200 mg by mouth daily.      Marland Kitchen aspirin EC 81 MG tablet Take 81 mg by mouth daily.      Marland Kitchen atorvastatin (LIPITOR) 20 MG tablet Take 20 mg by mouth at bedtime.       . carvedilol (COREG) 6.25 MG tablet Take 1 tablet (6.25 mg total) by mouth 2 (two) times daily with a meal.  60 tablet  5  . cholecalciferol (VITAMIN D) 1000 UNITS tablet Take 1,000 Units by mouth daily.      Tery Sanfilippo Calcium (STOOL SOFTENER PO) Take 1 capsule by mouth 2 (two) times daily.      Marland Kitchen glucose blood (RELION PRIME TEST) test strip Test blood sugar 3-5 times daily, dx: 250.00  200 each  11  . insulin detemir (LEVEMIR) 100 UNIT/ML injection Inject 0.2 mLs (20 Units total) into the skin daily before breakfast.  20 mL  1  . isosorbide mononitrate (IMDUR) 30 MG 24 hr tablet Take 30 mg by mouth at bedtime.      Marland Kitchen loratadine (CLARITIN) 10 MG tablet Take 10 mg by mouth 2 (two) times daily as needed for allergies.      Marland Kitchen LORazepam (ATIVAN) 0.5 MG tablet Take 0.5-1 mg by mouth at bedtime as needed (for sleep).       . Multiple Vitamin (MULTIVITAMIN WITH MINERALS) TABS Take 1 tablet by mouth daily.      . Niacin (VITAMIN B-3 PO) Take 1 tablet by mouth daily.      . Omega-3 Fatty Acids (FISH OIL) 1000 MG CAPS Take 1,000 mg by mouth daily.      . quinapril (ACCUPRIL) 10 MG tablet Take 10 mg by mouth at bedtime.      . sitaGLIPtin (JANUVIA) 100 MG tablet Take 1 tablet (100 mg total) by mouth daily.  90 tablet  1  . torsemide (DEMADEX) 20 MG tablet Take 40 mg by mouth 2 (two) times daily. Hold afternoon dose if  your weight at home is under 160#      . vitamin E 400 UNIT capsule Take 400 Units by mouth daily.      Marland Kitchen warfarin (COUMADIN) 5 MG tablet as directed.        No current facility-administered medications on file prior to visit.    No Known Allergies  Past Medical History  Diagnosis Date  . Atrial fibrillation      S/P ablation  . CAD (coronary artery disease)     S/P MI 1980's----------------------Dr McDowell  . Hyperlipidemia   . CHF (congestive heart failure)   . 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 BPH (benign prostatic hypertrophy)   . Stroke   . Sleep disturbance     Past Surgical History  Procedure Laterality Date  . Coronary artery bypass graft    . Appendectomy    . Tonsillectomy    . Upper gastrointestinal endoscopy      Gastritis , ? H. pylori 08/28/1999  . Atrial ablation surgery  Family History  Problem Relation Age of Onset  . Parkinson's disease Mother   . Heart disease Father   . Skin cancer Sister   . Heart attack Mother     History   Social History  . Marital Status: Single    Spouse Name: N/A    Number of Children: N/A  . Years of Education: N/A   Occupational History  . disabled due to heart disease    Social History Main Topics  . Smoking status: Former Smoker -- 1.00 packs/day    Types: Cigarettes    Quit date: 07/15/1973  . Smokeless tobacco: Never Used  . Alcohol Use: No  . Drug Use: No  . Sexually Active: Not on file   Other Topics Concern  . Not on file   Social History Narrative   Single--lives alone   Stays with sister with Altzheimer's frequently   Has living will   Brother, Gerlene Burdock has health care POA   Would accept resuscitation but no prolonged artificial ventilation   No feeding tube if cognitively unaware            Review of Systems Sleeping okay---chair and bed at times. Still has trouble lying down in bed (but may move there later in the night) Appetite is off but  still eats regularly. "I eat because I am supposed to" Lots of stabbing pain in feet--usually when sitting down (like leaning forward on a chair)    Objective:   Physical Exam  Constitutional: He appears well-developed. No distress.  Neck: Normal range of motion. Neck supple.  Cardiovascular: Normal rate, regular rhythm and intact distal pulses.   Faint distal pulses Soft coarse systolic murmur ?soft S3 gallop  Pulmonary/Chest: Effort normal and breath sounds normal. No respiratory distress. He has no wheezes. He has no rales.  Musculoskeletal: He exhibits edema.  Trace calf edema only  Lymphadenopathy:    He has no cervical adenopathy.  Skin: Rash noted. No erythema.  Venous stasis changes in calves and feet without open areas          Assessment & Plan:

## 2013-03-14 NOTE — Patient Instructions (Signed)
Please take the 2 torsemide and 2 spironolactone every morning. Take 2 torsemide in the afternoon if your weight is over 160# at home

## 2013-03-14 NOTE — Assessment & Plan Note (Addendum)
Still regular on the amiodarone ASA and coumadin

## 2013-03-14 NOTE — Assessment & Plan Note (Signed)
Better now with the diuresis Discussed diuretic dosing--- torsemide 40mg  and spironolactone 50mg , all in the morning Continue to monitor weight daily Still has DOE--but is improved

## 2013-03-14 NOTE — Assessment & Plan Note (Signed)
Has increased in intensity again Mostly with sitting though If worsens, will start gabapentin

## 2013-03-15 LAB — BASIC METABOLIC PANEL
BUN: 33 mg/dL — ABNORMAL HIGH (ref 6–23)
Chloride: 99 mEq/L (ref 96–112)
GFR: 53.5 mL/min — ABNORMAL LOW (ref >60.00)
Potassium: 4.7 mEq/L (ref 3.5–5.1)
Sodium: 136 mEq/L (ref 135–145)

## 2013-03-24 ENCOUNTER — Ambulatory Visit (INDEPENDENT_AMBULATORY_CARE_PROVIDER_SITE_OTHER): Payer: Medicare Other | Admitting: Family Medicine

## 2013-03-24 DIAGNOSIS — Z7901 Long term (current) use of anticoagulants: Secondary | ICD-10-CM

## 2013-03-24 DIAGNOSIS — I4891 Unspecified atrial fibrillation: Secondary | ICD-10-CM

## 2013-03-24 DIAGNOSIS — G459 Transient cerebral ischemic attack, unspecified: Secondary | ICD-10-CM

## 2013-03-24 DIAGNOSIS — Z5181 Encounter for therapeutic drug level monitoring: Secondary | ICD-10-CM

## 2013-03-24 LAB — POCT INR: INR: 2.3

## 2013-03-24 NOTE — Patient Instructions (Signed)
Continue to take 1 tablet every day except take 1 1/2 tablets on Monday.  Re-check in 6 weeks.

## 2013-04-06 ENCOUNTER — Other Ambulatory Visit: Payer: Self-pay | Admitting: *Deleted

## 2013-04-06 MED ORDER — AMIODARONE HCL 200 MG PO TABS: 200.0000 mg | ORAL_TABLET | Freq: Every day | ORAL | Status: DC

## 2013-04-06 NOTE — Telephone Encounter (Signed)
Refilled Amiodarone sent to [facility] pharmacy.

## 2013-04-14 ENCOUNTER — Ambulatory Visit (INDEPENDENT_AMBULATORY_CARE_PROVIDER_SITE_OTHER): Payer: Medicare Other | Admitting: Internal Medicine

## 2013-04-14 ENCOUNTER — Encounter: Payer: Self-pay | Admitting: Internal Medicine

## 2013-04-14 VITALS — BP 110/60 | HR 65 | Temp 98.5°F | Wt 165.0 lb

## 2013-04-14 DIAGNOSIS — E1149 Type 2 diabetes mellitus with other diabetic neurological complication: Secondary | ICD-10-CM

## 2013-04-14 DIAGNOSIS — E1140 Type 2 diabetes mellitus with diabetic neuropathy, unspecified: Secondary | ICD-10-CM

## 2013-04-14 DIAGNOSIS — Z23 Encounter for immunization: Secondary | ICD-10-CM

## 2013-04-14 DIAGNOSIS — E1142 Type 2 diabetes mellitus with diabetic polyneuropathy: Secondary | ICD-10-CM

## 2013-04-14 DIAGNOSIS — I5022 Chronic systolic (congestive) heart failure: Secondary | ICD-10-CM

## 2013-04-14 DIAGNOSIS — E1159 Type 2 diabetes mellitus with other circulatory complications: Secondary | ICD-10-CM

## 2013-04-14 NOTE — Assessment & Plan Note (Signed)
Ongoing pain but okay without meds

## 2013-04-14 NOTE — Progress Notes (Signed)
Subjective:    Patient ID: [name], male    DOB: 1941-07-10, 72 y.o.   MRN: [id number]  HPI Has gained some fluid back Weight is up 10# Has restarted the afternoon torsemide but forgets at times  Breathing is more labored with the fluid--but not as bad as before Has bought alarm clock to go off at [facility] for when his weight is over 160# Tries to be careful with salt  Checking sugars 4 times per day Does tend to run low at night Pain in feet is present but not bad enough to start meds yet Notes hypersensitivity  Current Outpatient Prescriptions on File Prior to Visit  Medication Sig Dispense Refill  . albuterol (PROVENTIL) (5 MG/ML) 0.5% nebulizer solution Take 0.5 mLs (2.5 mg total) by nebulization every 6 (six) hours as needed for wheezing or shortness of breath.  150 mL  1  . amiodarone (PACERONE) 200 MG tablet Take 1 tablet (200 mg total) by mouth daily.  30 tablet  3  . aspirin EC 81 MG tablet Take 81 mg by mouth daily.      Marland Kitchen atorvastatin (LIPITOR) 20 MG tablet Take 20 mg by mouth at bedtime.       . carvedilol (COREG) 6.25 MG tablet Take 1 tablet (6.25 mg total) by mouth 2 (two) times daily with a meal.  60 tablet  5  . cholecalciferol (VITAMIN D) 1000 UNITS tablet Take 1,000 Units by mouth daily.      Tery Sanfilippo Calcium (STOOL SOFTENER PO) Take 1 capsule by mouth 2 (two) times daily.      Marland Kitchen glucose blood (RELION PRIME TEST) test strip Test blood sugar 3-5 times daily, dx: 250.00  200 each  11  . insulin detemir (LEVEMIR) 100 UNIT/ML injection Inject 0.2 mLs (20 Units total) into the skin daily before breakfast.  20 mL  1  . Insulin Syringe-Needle U-100 (BD INSULIN SYRINGE ULTRAFINE) 31G X 5/16" 1 ML MISC USE AS DIRECTED to test blood sugar 1-2 times daily dx: 250.72  200 each  6  . isosorbide mononitrate (IMDUR) 30 MG 24 hr tablet Take 30 mg by mouth at bedtime.      Marland Kitchen loratadine (CLARITIN) 10 MG tablet Take 10 mg by mouth 2 (two) times daily as needed for allergies.       Marland Kitchen LORazepam (ATIVAN) 0.5 MG tablet Take 0.5-1 mg by mouth at bedtime as needed (for sleep).       . Multiple Vitamin (MULTIVITAMIN WITH MINERALS) TABS Take 1 tablet by mouth daily.      . Niacin (VITAMIN B-3 PO) Take 1 tablet by mouth daily.      . Omega-3 Fatty Acids (FISH OIL) 1000 MG CAPS Take 1,000 mg by mouth daily.      . quinapril (ACCUPRIL) 10 MG tablet Take 10 mg by mouth at bedtime.      . sitaGLIPtin (JANUVIA) 100 MG tablet Take 1 tablet (100 mg total) by mouth daily.  90 tablet  1  . spironolactone (ALDACTONE) 25 MG tablet Take 2 tablets (50 mg total) by mouth daily.  60 tablet  11  . torsemide (DEMADEX) 20 MG tablet Take 40 mg by mouth 2 (two) times daily. Hold afternoon dose if your weight at home is under 160#      . vitamin E 400 UNIT capsule Take 400 Units by mouth daily.      Marland Kitchen warfarin (COUMADIN) 5 MG tablet as directed.  No current facility-administered medications on file prior to visit.    No Known Allergies  Past Medical History  Diagnosis Date  . Atrial fibrillation      S/P ablation  . CAD (coronary artery disease)     S/P MI 1980's----------------------Dr McDowell  . Hyperlipidemia   . CHF (congestive heart failure)   . 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 BPH (benign prostatic hypertrophy)   . Stroke   . Sleep disturbance     Past Surgical History  Procedure Laterality Date  . Coronary artery bypass graft    . Appendectomy    . Tonsillectomy    . Upper gastrointestinal endoscopy      Gastritis , ? H. pylori 08/28/1999  . Atrial ablation surgery      Family History  Problem Relation Age of Onset  . Parkinson's disease Mother   . Heart disease Father   . Skin cancer Sister   . Heart attack Mother     History   Social History  . Marital Status: Single    Spouse Name: N/A    Number of Children: N/A  . Years of Education: N/A   Occupational History  . disabled due to heart disease    Social  History Main Topics  . Smoking status: Former Smoker -- 1.00 packs/day    Types: Cigarettes    Quit date: 07/15/1973  . Smokeless tobacco: Never Used  . Alcohol Use: No  . Drug Use: No  . Sexual Activity: Not on file   Other Topics Concern  . Not on file   Social History Narrative   Single--lives alone   Stays with sister with Altzheimer's frequently   Has living will   Brother, Gerlene Burdock has health care POA   Would accept resuscitation but no prolonged artificial ventilation   No feeding tube if cognitively unaware            Review of Systems Appetite is okay Sleeping reasonably well--better than in past     Objective:   Physical Exam  Constitutional: He appears well-developed and well-nourished. No distress.  Neck: Normal range of motion. Neck supple.  Cardiovascular: Normal rate and regular rhythm.  Exam reveals no gallop.   Murmur heard. Soft systolic murmur  Pulmonary/Chest: Effort normal. No respiratory distress. He has no wheezes.  Basilar rhonchi or coarse crackles  Musculoskeletal: He exhibits no edema.  Lymphadenopathy:    He has no cervical adenopathy.          Assessment & Plan:

## 2013-04-14 NOTE — Assessment & Plan Note (Signed)
Mildly worse but not bad He understands the diuretic changes to be made Will recheck renal

## 2013-04-14 NOTE — Assessment & Plan Note (Signed)
Seems to be okay Will recheck A1c

## 2013-04-15 DIAGNOSIS — Z23 Encounter for immunization: Secondary | ICD-10-CM

## 2013-04-15 LAB — HEMOGLOBIN A1C: Hgb A1c MFr Bld: 7.1 % — ABNORMAL HIGH (ref 4.6–6.5)

## 2013-04-15 LAB — BASIC METABOLIC PANEL
Chloride: 100 mEq/L (ref 96–112)
Creatinine, Ser: 1.6 mg/dL — ABNORMAL HIGH (ref 0.4–1.5)
Sodium: 138 mEq/L (ref 135–145)

## 2013-04-15 NOTE — Addendum Note (Signed)
Addended by: Sueanne Margarita on: 04/15/2013 01:00 PM   Modules accepted: Orders

## 2013-04-19 ENCOUNTER — Encounter: Payer: Self-pay | Admitting: *Deleted

## 2013-05-05 ENCOUNTER — Encounter: Payer: Self-pay | Admitting: Internal Medicine

## 2013-05-05 ENCOUNTER — Ambulatory Visit (INDEPENDENT_AMBULATORY_CARE_PROVIDER_SITE_OTHER): Payer: Medicare Other | Admitting: Family Medicine

## 2013-05-05 ENCOUNTER — Ambulatory Visit (INDEPENDENT_AMBULATORY_CARE_PROVIDER_SITE_OTHER): Payer: Medicare Other | Admitting: Internal Medicine

## 2013-05-05 VITALS — BP 100/60 | HR 62 | Temp 98.0°F | Wt 165.0 lb

## 2013-05-05 DIAGNOSIS — I5023 Acute on chronic systolic (congestive) heart failure: Secondary | ICD-10-CM

## 2013-05-05 DIAGNOSIS — Z7901 Long term (current) use of anticoagulants: Secondary | ICD-10-CM

## 2013-05-05 DIAGNOSIS — I4891 Unspecified atrial fibrillation: Secondary | ICD-10-CM

## 2013-05-05 DIAGNOSIS — Z5181 Encounter for therapeutic drug level monitoring: Secondary | ICD-10-CM

## 2013-05-05 LAB — POCT INR: INR: 3.4

## 2013-05-05 MED ORDER — TORSEMIDE 20 MG PO TABS: 40.0000 mg | ORAL_TABLET | Freq: Two times a day (BID) | ORAL | Status: DC

## 2013-05-05 MED ORDER — SPIRONOLACTONE 100 MG PO TABS: 100.0000 mg | ORAL_TABLET | Freq: Every day | ORAL | Status: DC

## 2013-05-05 NOTE — Patient Instructions (Signed)
Skip Coumadin tomorrow (10/3) and then Continue to take 1 tablet every day except take 1 1/2 tablets on Monday.  Re-check in 6 weeks

## 2013-05-05 NOTE — Progress Notes (Signed)
Subjective:    Patient ID: [name], male    DOB: Sep 23, 1940, 72 y.o.   MRN: [id number]  HPI Weight has been going back up again Was taking the 2 extra fluid tablets As high as 170# at home and came down slightly  Takes the 2 spironolactone in AM Had been taking 2 torsemide in the morning but then was taking the second dose in afternoon  Breathing is off again Swelling is more noticeable Occasional chest pain--usually with exertion  Current Outpatient Prescriptions on File Prior to Visit  Medication Sig Dispense Refill  . albuterol (PROVENTIL) (5 MG/ML) 0.5% nebulizer solution Take 0.5 mLs (2.5 mg total) by nebulization every 6 (six) hours as needed for wheezing or shortness of breath.  150 mL  1  . amiodarone (PACERONE) 200 MG tablet Take 1 tablet (200 mg total) by mouth daily.  30 tablet  3  . aspirin EC 81 MG tablet Take 81 mg by mouth daily.      Marland Kitchen atorvastatin (LIPITOR) 20 MG tablet Take 20 mg by mouth at bedtime.       . carvedilol (COREG) 6.25 MG tablet Take 1 tablet (6.25 mg total) by mouth 2 (two) times daily with a meal.  60 tablet  5  . cholecalciferol (VITAMIN D) 1000 UNITS tablet Take 1,000 Units by mouth daily.      Tery Sanfilippo Calcium (STOOL SOFTENER PO) Take 1 capsule by mouth 2 (two) times daily.      Marland Kitchen glucose blood (RELION PRIME TEST) test strip Test blood sugar 3-5 times daily, dx: 250.00  200 each  11  . insulin detemir (LEVEMIR) 100 UNIT/ML injection Inject 0.2 mLs (20 Units total) into the skin daily before breakfast.  20 mL  1  . Insulin Syringe-Needle U-100 (BD INSULIN SYRINGE ULTRAFINE) 31G X 5/16" 1 ML MISC USE AS DIRECTED to test blood sugar 1-2 times daily dx: 250.72  200 each  6  . isosorbide mononitrate (IMDUR) 30 MG 24 hr tablet Take 30 mg by mouth at bedtime.      Marland Kitchen loratadine (CLARITIN) 10 MG tablet Take 10 mg by mouth 2 (two) times daily as needed for allergies.      Marland Kitchen LORazepam (ATIVAN) 0.5 MG tablet Take 0.5-1 mg by mouth at bedtime as needed  (for sleep).       . Multiple Vitamin (MULTIVITAMIN WITH MINERALS) TABS Take 1 tablet by mouth daily.      . Niacin (VITAMIN B-3 PO) Take 1 tablet by mouth daily.      . Omega-3 Fatty Acids (FISH OIL) 1000 MG CAPS Take 1,000 mg by mouth daily.      . quinapril (ACCUPRIL) 10 MG tablet Take 10 mg by mouth at bedtime.      . sitaGLIPtin (JANUVIA) 100 MG tablet Take 1 tablet (100 mg total) by mouth daily.  90 tablet  1  . spironolactone (ALDACTONE) 25 MG tablet Take 2 tablets (50 mg total) by mouth daily.  60 tablet  11  . torsemide (DEMADEX) 20 MG tablet Take 40 mg by mouth 2 (two) times daily. Hold afternoon dose if your weight at home is under 160#      . vitamin E 400 UNIT capsule Take 400 Units by mouth daily.      Marland Kitchen warfarin (COUMADIN) 5 MG tablet as directed.        No current facility-administered medications on file prior to visit.    No Known Allergies  Past Medical History  Diagnosis Date  . Atrial fibrillation      S/P ablation  . CAD (coronary artery disease)     S/P MI 1980's----------------------Dr McDowell  . Hyperlipidemia   . CHF (congestive heart failure)   . 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 BPH (benign prostatic hypertrophy)   . Stroke   . Sleep disturbance     Past Surgical History  Procedure Laterality Date  . Coronary artery bypass graft    . Appendectomy    . Tonsillectomy    . Upper gastrointestinal endoscopy      Gastritis , ? H. pylori 08/28/1999  . Atrial ablation surgery      Family History  Problem Relation Age of Onset  . Parkinson's disease Mother   . Heart disease Father   . Skin cancer Sister   . Heart attack Mother     History   Social History  . Marital Status: Single    Spouse Name: N/A    Number of Children: N/A  . Years of Education: N/A   Occupational History  . disabled due to heart disease    Social History Main Topics  . Smoking status: Former Smoker -- 1.00 packs/day    Types:  Cigarettes    Quit date: 07/15/1973  . Smokeless tobacco: Never Used  . Alcohol Use: No  . Drug Use: No  . Sexual Activity: Not on file   Other Topics Concern  . Not on file   Social History Narrative   Single--lives alone   Stays with sister with Altzheimer's frequently   Has living will   Brother, Gerlene Burdock has health care POA   Would accept resuscitation but no prolonged artificial ventilation   No feeding tube if cognitively unaware            Review of Systems Appetite is off--but tries to eat No nausea or vomiting    Objective:   Physical Exam  Constitutional: He appears well-developed and well-nourished. No distress.  Cardiovascular: Normal rate and regular rhythm.   Murmur heard. Gr 2/6 systolic murmur  Pulmonary/Chest: Effort normal. No respiratory distress. He has no wheezes. He has rales.  Bibasilar crackles-- L>R  Musculoskeletal: He exhibits edema.  1+ tense edema          Assessment & Plan:

## 2013-05-05 NOTE — Assessment & Plan Note (Signed)
Mild exacerbation Better insight by him to come in  Will increase the spironolactone to 100mg  daily Continue the bid torsemide

## 2013-05-05 NOTE — Patient Instructions (Signed)
Increase the spironolactone to 100mg  every morning. This will now be in the single pill. Continue the torsemide 2 tabs (40mg  total) twice a day

## 2013-05-12 ENCOUNTER — Encounter: Payer: Self-pay | Admitting: Internal Medicine

## 2013-05-12 ENCOUNTER — Ambulatory Visit (INDEPENDENT_AMBULATORY_CARE_PROVIDER_SITE_OTHER): Payer: Medicare Other | Admitting: Internal Medicine

## 2013-05-12 VITALS — BP 122/62 | HR 60 | Temp 98.4°F | Wt 165.5 lb

## 2013-05-12 DIAGNOSIS — I5023 Acute on chronic systolic (congestive) heart failure: Secondary | ICD-10-CM

## 2013-05-12 IMAGING — US US ABDOMEN COMPLETE
1 series · 13 of 25 positions shown · non-contrast
Comparison: None.

CLINICAL DATA: Enlarged liver on physical exam, post prandial
nausea

COMPLETE ABDOMINAL ULTRASOUND

[Series 1: us abdomen complete · 0.26mm/px · 13 of 98 slices shown]
[im 1/98]
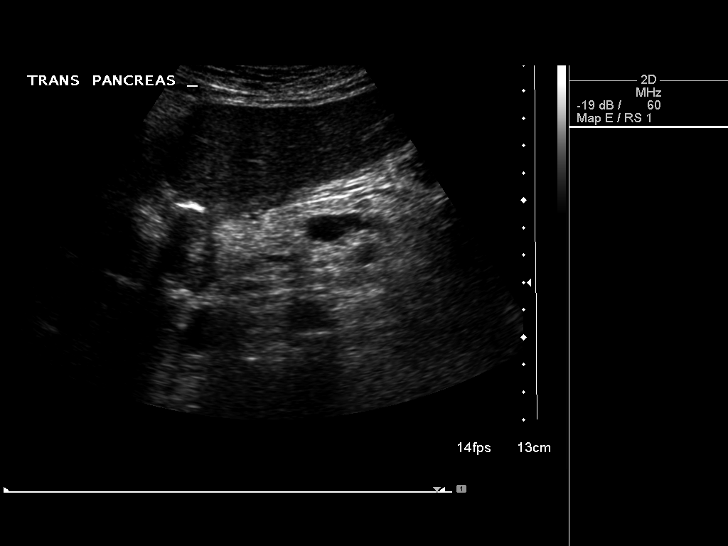
[im 9/98]
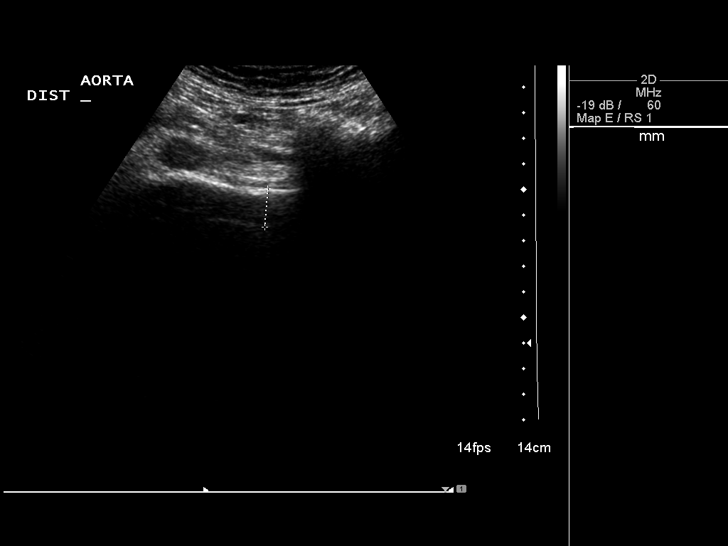
[im 17/98]
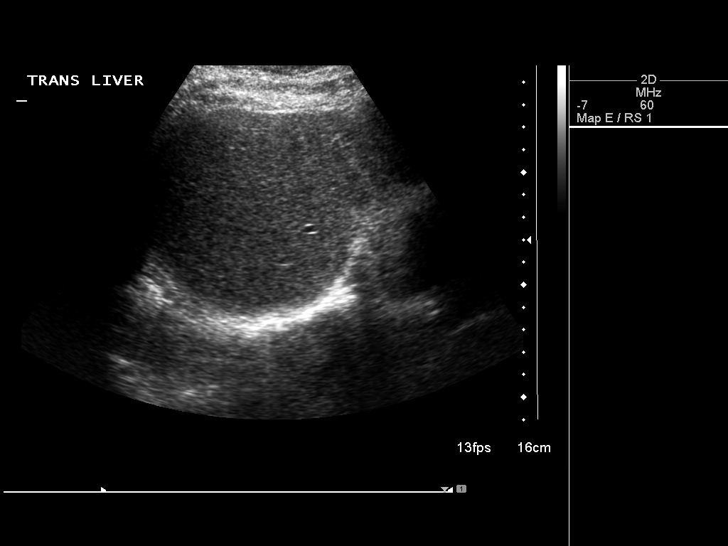
[im 25/98]
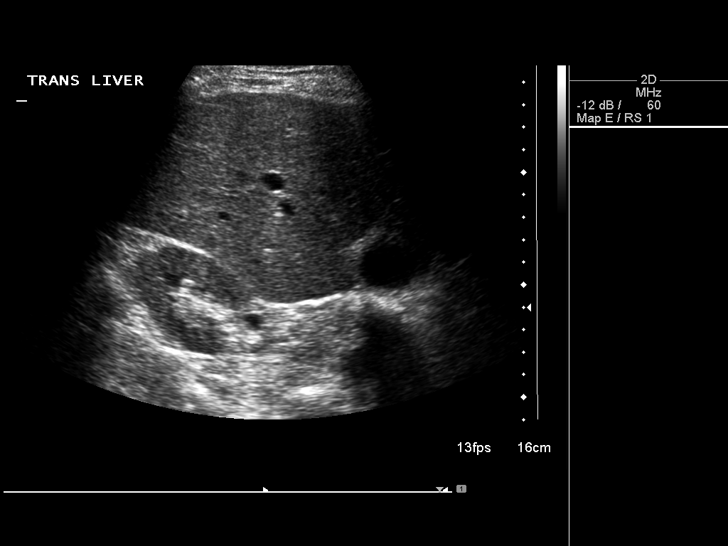
[im 33/98]
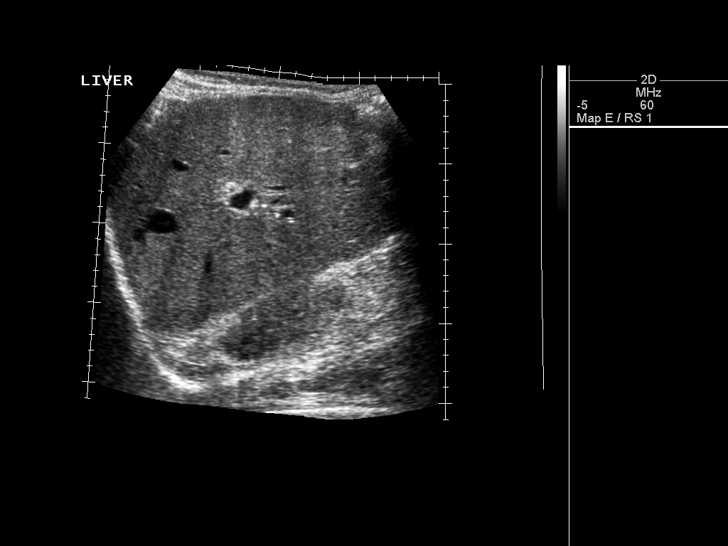
[im 41/98]
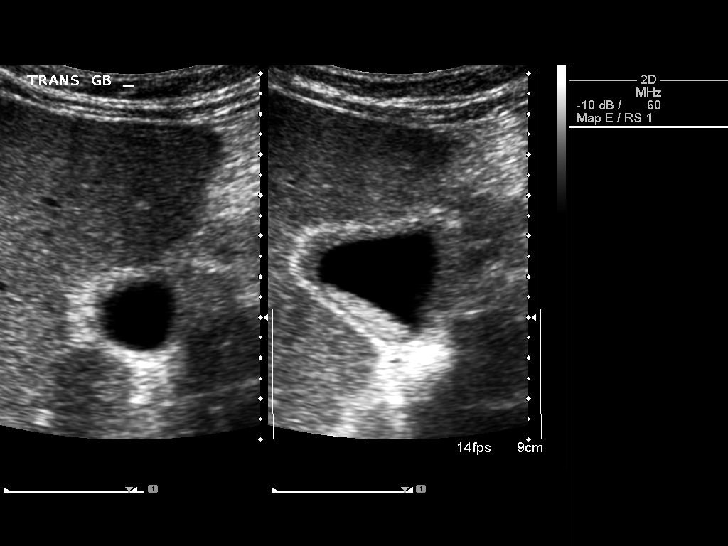
[im 49/98]
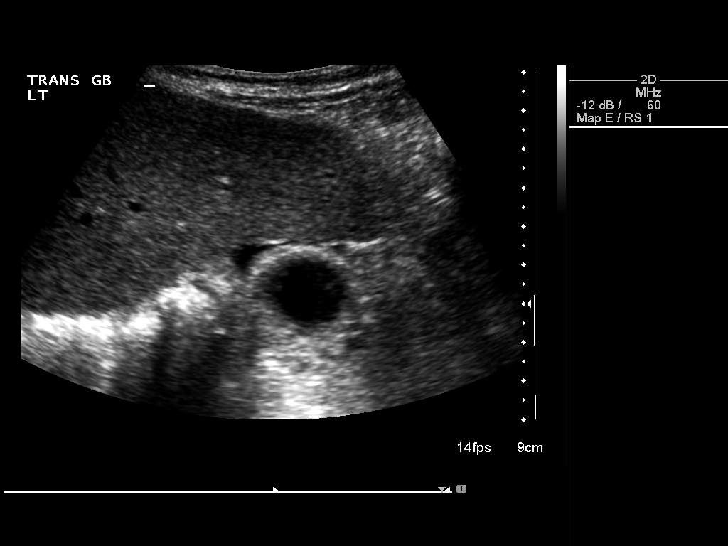
[im 57/98]
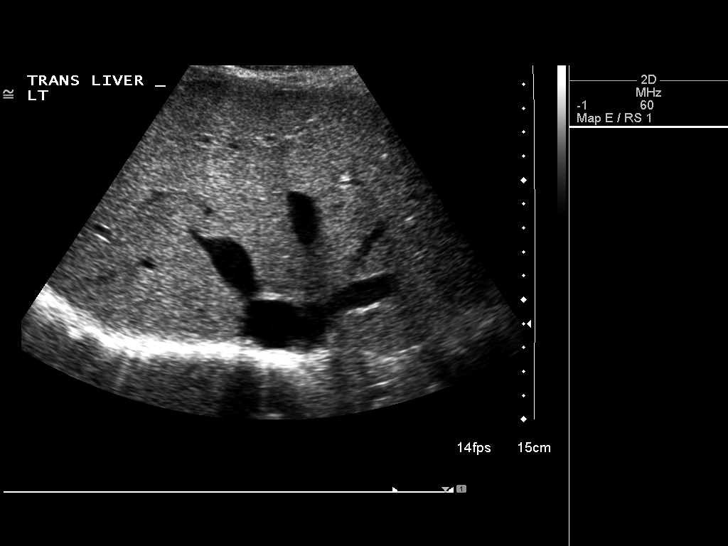
[im 65/98]
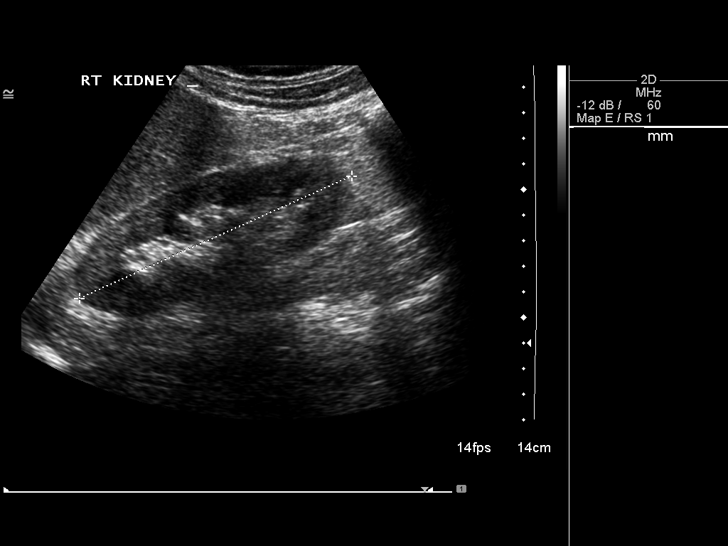
[im 73/98]
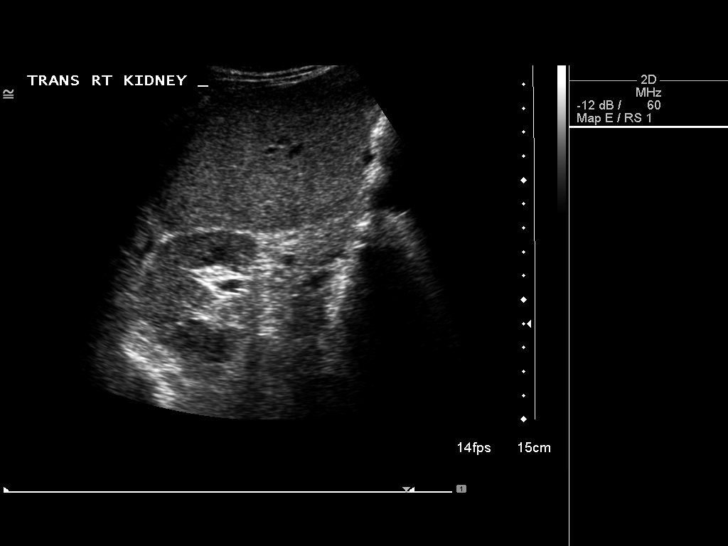
[im 81/98]
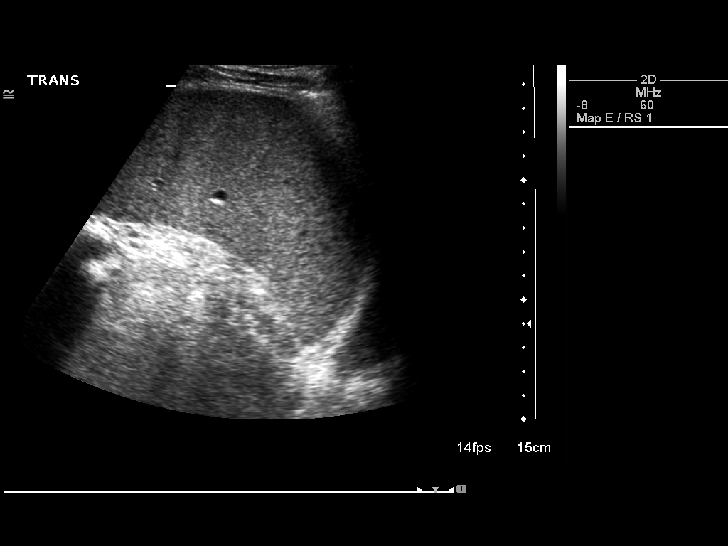
[im 89/98]
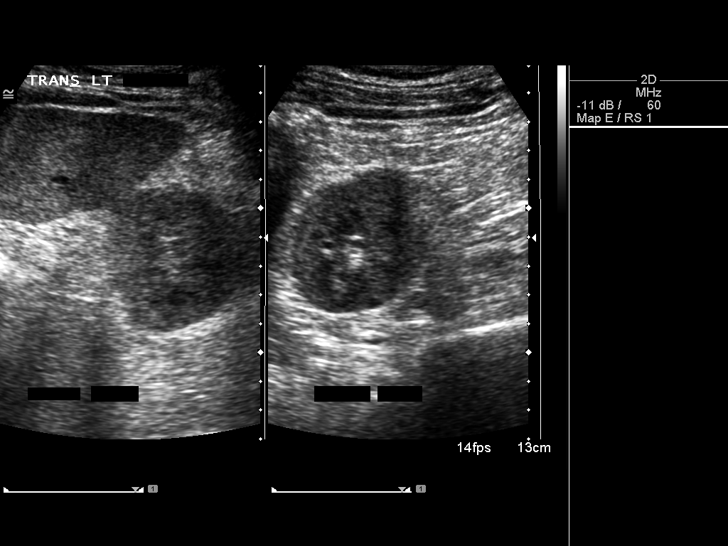
[im 98/98]
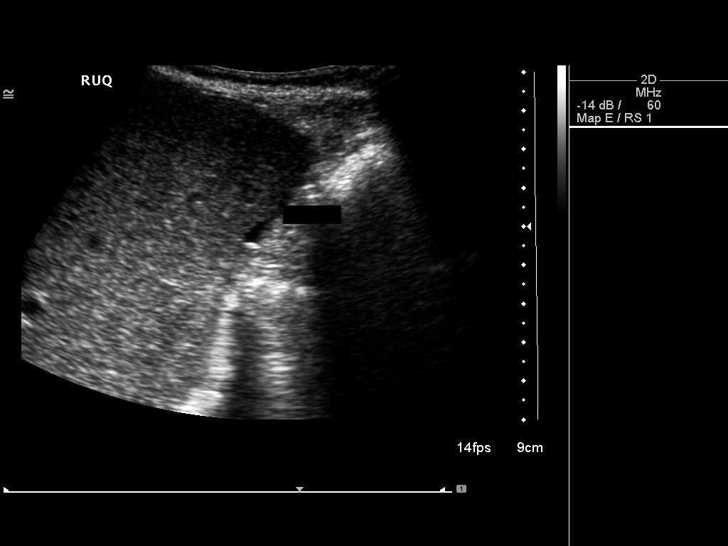

[13 of 25 positions shown; findings below may reference images not displayed]

FINDINGS: Gallbladder:  There is mobile gallbladder sludge versus tiny
nonshadowing gallstones.  The gallbladder wall is somewhat
prominent measuring 3.4 mm diffusely.  This can be seen with
hypoproteinemia.  No pain is present with compression over the
gallbladder.  There is a small amount of fluid adjacent to the
gallbladder.

Common bile duct:  The common bile duct is normal measuring 5.1 mm
in diameter.

Liver:  The liver is prominent measuring 19.7 cm sagittally.  No
focal hepatic abnormality is seen.

IVC:  Appears normal.

Pancreas:  The tail of the pancreas is obscured by bowel gas.

Spleen:  The spleen is enlarged measuring 14.0 cm sagittally with a
volume of 756 ml.

Right Kidney:  No hydronephrosis is seen.  The right kidney
measures 11.7 cm sagittally.

Left Kidney:  No hydronephrosis is noted.  The left kidney measures
13.2 cm.

Abdominal aorta:  It the abdominal aorta is normal in caliber with
atheromatous change present.
IMPRESSION: 1.  Splenomegaly.
2.  Thickened gallbladder wall diffusely may indicate
hypoproteinemia.  No present evidence of acute cholecystitis is
seen.  Correlate with liver function tests.
3.  Small amount of gallbladder sludge or nonshadowing gallstones.
4.  Hepatomegaly.
5.  The tail of the pancreas is obscured by bowel gas.

## 2013-05-12 MED ORDER — METOLAZONE 2.5 MG PO TABS: 2.5000 mg | ORAL_TABLET | Freq: Every day | ORAL | Status: DC

## 2013-05-12 NOTE — Patient Instructions (Signed)
Please take the extra medication (metolazone) for the next 3 days---along with your other fluid pills.  Call Dr Mariah Milling for an appointment if noone from his staff has called you by lunchtime tomorrow.

## 2013-05-12 NOTE — Assessment & Plan Note (Signed)
Fluid overload resistant to current diuretics Will add metolazone for several days Will get Dr Mariah Milling to see him soon

## 2013-05-12 NOTE — Progress Notes (Signed)
Subjective:    Patient ID: [name], male    DOB: [birth year], 72 y.o.   MRN: [id number]  HPI Weight has not come down at all Did increase the spironolactone to 100mg  daily Torsemide 40mg  bid  Still with breathing problems Edema could be slightly better  No chest pain Mild brief palpitations--mostly at night  Current Outpatient Prescriptions on File Prior to Visit  Medication Sig Dispense Refill  . albuterol (PROVENTIL) (5 MG/ML) 0.5% nebulizer solution Take 0.5 mLs (2.5 mg total) by nebulization every 6 (six) hours as needed for wheezing or shortness of breath.  150 mL  1  . amiodarone (PACERONE) 200 MG tablet Take 1 tablet (200 mg total) by mouth daily.  30 tablet  3  . aspirin EC 81 MG tablet Take 81 mg by mouth daily.      Marland Kitchen atorvastatin (LIPITOR) 20 MG tablet Take 20 mg by mouth at bedtime.       . carvedilol (COREG) 6.25 MG tablet Take 1 tablet (6.25 mg total) by mouth 2 (two) times daily with a meal.  60 tablet  5  . cholecalciferol (VITAMIN D) 1000 UNITS tablet Take 1,000 Units by mouth daily.      Tery Sanfilippo Calcium (STOOL SOFTENER PO) Take 1 capsule by mouth 2 (two) times daily.      Marland Kitchen glucose blood (RELION PRIME TEST) test strip Test blood sugar 3-5 times daily, dx: 250.00  200 each  11  . insulin detemir (LEVEMIR) 100 UNIT/ML injection Inject 0.2 mLs (20 Units total) into the skin daily before breakfast.  20 mL  1  . Insulin Syringe-Needle U-100 (BD INSULIN SYRINGE ULTRAFINE) 31G X 5/16" 1 ML MISC USE AS DIRECTED to test blood sugar 1-2 times daily dx: 250.72  200 each  6  . isosorbide mononitrate (IMDUR) 30 MG 24 hr tablet Take 30 mg by mouth at bedtime.      Marland Kitchen loratadine (CLARITIN) 10 MG tablet Take 10 mg by mouth 2 (two) times daily as needed for allergies.      Marland Kitchen LORazepam (ATIVAN) 0.5 MG tablet Take 0.5-1 mg by mouth at bedtime as needed (for sleep).       . Multiple Vitamin (MULTIVITAMIN WITH MINERALS) TABS Take 1 tablet by mouth daily.      . Niacin (VITAMIN  B-3 PO) Take 1 tablet by mouth daily.      . Omega-3 Fatty Acids (FISH OIL) 1000 MG CAPS Take 1,000 mg by mouth daily.      . quinapril (ACCUPRIL) 10 MG tablet Take 10 mg by mouth at bedtime.      . sitaGLIPtin (JANUVIA) 100 MG tablet Take 1 tablet (100 mg total) by mouth daily.  90 tablet  1  . spironolactone (ALDACTONE) 100 MG tablet Take 1 tablet (100 mg total) by mouth daily.  30 tablet  11  . torsemide (DEMADEX) 20 MG tablet Take 2 tablets (40 mg total) by mouth 2 (two) times daily. Hold afternoon dose if your weight at home is under 160#  120 tablet  11  . vitamin E 400 UNIT capsule Take 400 Units by mouth daily.      Marland Kitchen warfarin (COUMADIN) 5 MG tablet as directed.        No current facility-administered medications on file prior to visit.    No Known Allergies  Past Medical History  Diagnosis Date  . Atrial fibrillation      S/P ablation  . CAD (coronary artery disease)  S/P MI 1980's----------------------Dr McDowell  . Hyperlipidemia   . CHF (congestive heart failure)   . 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 BPH (benign prostatic hypertrophy)   . Stroke   . Sleep disturbance     Past Surgical History  Procedure Laterality Date  . Coronary artery bypass graft    . Appendectomy    . Tonsillectomy    . Upper gastrointestinal endoscopy      Gastritis , ? H. pylori 08/28/1999  . Atrial ablation surgery      Family History  Problem Relation Age of Onset  . Parkinson's disease Mother   . Heart disease Father   . Skin cancer Sister   . Heart attack Mother     History   Social History  . Marital Status: Single    Spouse Name: N/A    Number of Children: N/A  . Years of Education: N/A   Occupational History  . disabled due to heart disease    Social History Main Topics  . Smoking status: Former Smoker -- 1.00 packs/day    Types: Cigarettes    Quit date: 07/15/1973  . Smokeless tobacco: Never Used  . Alcohol Use: No  .  Drug Use: No  . Sexual Activity: Not on file   Other Topics Concern  . Not on file   Social History Narrative   Single--lives alone   Stays with sister with Altzheimer's frequently   Has living will   Brother, Gerlene Burdock has health care POA   Would accept resuscitation but no prolonged artificial ventilation   No feeding tube if cognitively unaware             Review of Systems No fever Did have brief episode of chills about a week ago Appetite is okay Taking some glucerna recently    Objective:   Physical Exam  Constitutional: He appears well-developed. No distress.  Cardiovascular: Normal rate, regular rhythm and normal heart sounds.  Exam reveals no gallop.   No murmur heard. Pulmonary/Chest: No respiratory distress. He has no wheezes.  Dullness at bases and crackles bilaterally  Musculoskeletal: He exhibits edema.  1-2+ tense edema          Assessment & Plan:

## 2013-05-13 ENCOUNTER — Telehealth: Payer: Self-pay

## 2013-05-13 ENCOUNTER — Encounter: Payer: Self-pay | Admitting: Cardiovascular Disease

## 2013-05-13 ENCOUNTER — Ambulatory Visit (INDEPENDENT_AMBULATORY_CARE_PROVIDER_SITE_OTHER): Payer: Medicare Other | Admitting: Cardiovascular Disease

## 2013-05-13 VITALS — BP 90/50 | HR 56 | Ht 69.0 in | Wt 168.0 lb

## 2013-05-13 DIAGNOSIS — I5022 Chronic systolic (congestive) heart failure: Secondary | ICD-10-CM

## 2013-05-13 DIAGNOSIS — I10 Essential (primary) hypertension: Secondary | ICD-10-CM

## 2013-05-13 DIAGNOSIS — J438 Other emphysema: Secondary | ICD-10-CM

## 2013-05-13 DIAGNOSIS — R609 Edema, unspecified: Secondary | ICD-10-CM

## 2013-05-13 DIAGNOSIS — I2510 Atherosclerotic heart disease of native coronary artery without angina pectoris: Secondary | ICD-10-CM

## 2013-05-13 DIAGNOSIS — R0789 Other chest pain: Secondary | ICD-10-CM

## 2013-05-13 DIAGNOSIS — R0602 Shortness of breath: Secondary | ICD-10-CM

## 2013-05-13 NOTE — Patient Instructions (Addendum)
Please start metolazone 2.5 mg pill in the Am 30 minutes later, take the torsemide 2 pills Also take torsemide 2 pills after lunch (2 pm)  Take only 4 spironolactone pills a day  Try not to drink too much fluids  Please call us if you have new issues that need to be addressed before your next appt.  Your physician wants you to follow-up in: 6 weeks.

## 2013-05-13 NOTE — Progress Notes (Signed)
Patient ID: [name], male    DOB: 02-16-1941, 72 y.o.   MRN: [id number]  HPI Comments: [name] is a pleasant 72 year old gentleman with past medical history of diabetes, coronary artery disease, bypass 1996, ischemic cardiomyopathy with ejection fraction 25-30% in September 2013, moderate to severe pulmonary hypertension at that time,  Episodes of paroxysmal atrial fibrillation with history of ablation, hyperlipidemia, obstructive sleep apnea, hypertension and COPD who presents for routine followup. 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 Inferior wall defect was seen on stress test in 2011.  Hx of nightmares which wake him up.   On a clinic visit in 2013 his weight was  180 pounds. at  that time  he had significant edema and abdominal swelling with shortness of breath.  he was started on torsemide with improvement of his weight , initially down to 170 pounds, and into the 160 range . He reports that he has been relatively stable in this range.   Several weeks ago, his weight was up and torsemide was increased from 40 mg daily up to 40 mg twice a day. He was having significant abdominal swelling, leg edema, shortness of breath. He is currently taking torsemide 40 mg twice a day over the past 2 weeks with mild improvement of his symptoms. Recently seen by Dr. Alphonsus Sias . Recommendation was to use metolazone sparingly .  He reports that he is still Not eating as much. Thinning in the face. He continues to have mild edema, shortness of breath with exertion.  he is bothered by his stomach which is somewhat distended and tight.   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 albuterol (PROVENTIL) (5 MG/ML) 0.5% nebulizer  solution Take 0.5 mLs (2.5 mg total) by nebulization every 6 (six) hours as needed for wheezing or shortness of breath.  150 mL  1  . amiodarone (PACERONE) 200 MG tablet Take 1 tablet (200 mg total) by mouth daily.  30 tablet  3  . aspirin EC 81 MG tablet Take 81 mg by mouth daily.      Marland Kitchen atorvastatin (LIPITOR) 20 MG tablet Take 20 mg by mouth at bedtime.       . carvedilol (COREG) 6.25 MG tablet Take 1 tablet (6.25 mg total) by mouth 2 (two) times daily with a meal.  60 tablet  5  . cholecalciferol (VITAMIN D) 1000 UNITS tablet Take 1,000 Units by mouth daily.      [name] Calcium (STOOL SOFTENER PO) Take 1 capsule by mouth 2 (two) times daily.      Marland Kitchen glucose blood (RELION PRIME TEST) test strip Test blood sugar 3-5 times daily, dx: 250.00  200 each  11  . insulin detemir (LEVEMIR) 100 UNIT/ML injection Inject 0.2 mLs (20 Units total) into the skin daily before breakfast.  20 mL  1  . Insulin Syringe-Needle U-100 (BD INSULIN SYRINGE ULTRAFINE) 31G X 5/16" 1 ML MISC USE AS DIRECTED to test blood sugar 1-2 times daily dx: 250.72  200 each  6  . isosorbide mononitrate (IMDUR) 30 MG 24 hr tablet Take 30 mg by mouth at bedtime.      Marland Kitchen LORazepam (ATIVAN) 0.5 MG tablet Take 0.5-1 mg by mouth at bedtime as needed (for sleep).       Marland Kitchen  metolazone (ZAROXOLYN) 2.5 MG tablet Take 1 tablet (2.5 mg total) by mouth daily.  30 tablet  0  . Multiple Vitamin (MULTIVITAMIN WITH MINERALS) TABS Take 1 tablet by mouth daily.      . Omega-3 Fatty Acids (FISH OIL) 1000 MG CAPS Take 1,000 mg by mouth daily.      . quinapril (ACCUPRIL) 10 MG tablet Take 10 mg by mouth at bedtime.      . sitaGLIPtin (JANUVIA) 100 MG tablet Take 1 tablet (100 mg total) by mouth daily.  90 tablet  1  . spironolactone (ALDACTONE) 100 MG tablet Take 1 tablet (100 mg total) by mouth daily.  30 tablet  11  . torsemide (DEMADEX) 20 MG tablet Take 2 tablets (40 mg total) by mouth 2 (two) times daily. Hold afternoon dose if your weight at home is  under 160#  120 tablet  11  . warfarin (COUMADIN) 5 MG tablet as directed.        Review of Systems  Constitutional: Negative.   HENT: Negative.   Eyes: Negative.   Respiratory: Positive for shortness of breath.   Cardiovascular: Positive for leg swelling.  Gastrointestinal: Negative.   Endocrine: Negative.   Skin: Negative.   Allergic/Immunologic: Negative.   Neurological: Negative.   Hematological: Negative.   Psychiatric/Behavioral: Negative.   All other systems reviewed and are negative.    BP 90/50  Pulse 56  Ht 5\' 9"  (1.753 m)  Wt 168 lb (76.204 kg)  BMI 24.8 kg/m2  Physical Exam  Nursing note and vitals reviewed. Constitutional: He is oriented to person, place, and time. He appears well-developed and well-nourished.  HENT:  Head: Normocephalic.  Nose: Nose normal.  Mouth/Throat: Oropharynx is clear and moist.  Eyes: Conjunctivae are normal. Pupils are equal, round, and reactive to light.  Neck: Normal range of motion. Neck supple. No JVD present.  Cardiovascular: Normal rate, regular rhythm, S1 normal, S2 normal and intact distal pulses.  Exam reveals no gallop and no friction rub.   Murmur heard.  Crescendo systolic murmur is present with a grade of 2/6  Trace to 1+ pitting edema bilaterally below the knees  Pulmonary/Chest: Effort normal. No respiratory distress. He has decreased breath sounds in the right lower field and the left lower field. He has no wheezes. He has no rales. He exhibits no tenderness.  Abdominal: Soft. Bowel sounds are normal. He exhibits no distension. There is no tenderness.  Musculoskeletal: Normal range of motion. He exhibits no edema and no tenderness.  Lymphadenopathy:    He has no cervical adenopathy.  Neurological: He is alert and oriented to person, place, and time. Coordination normal.  Skin: Skin is warm and dry. No rash noted. No erythema.  Psychiatric: He has a normal mood and affect. His behavior is normal. Judgment and thought  content normal.      Assessment and Plan

## 2013-05-13 NOTE — Assessment & Plan Note (Signed)
Mild pitting edema. Likely secondary to underlying systolic CHF and pulmonary hypertension. Will add metolazone on a regular basis with close monitoring

## 2013-05-13 NOTE — Telephone Encounter (Signed)
Pt states he is having swelling in stomach and both feet, has an appt with Dr. Mariah Milling on Monday 05/16/2013, would like to talk with a nurse

## 2013-05-13 NOTE — Assessment & Plan Note (Signed)
Worsening ischemia is always a significant concern as a cause of his symptoms. If no improvement with increase diuresis, he may benefit from cardiac catheterization, though renal dysfunction would be a major problem and could exacerbate his renal issues.

## 2013-05-13 NOTE — Assessment & Plan Note (Signed)
Blood pressure is well controlled on today's visit. No changes made to the medications. 

## 2013-05-13 NOTE — Assessment & Plan Note (Signed)
Weight is approximately around his baseline, possibly slightly elevated. On clinical exam, he is to have mild pleural effusions bilaterally, likely accumulated several weeks ago when fluid was up or perhaps chronic. He continues to feel very short of breath likely exacerbated by his pleural effusions, heart failure, underlying COPD. We have recommended he could take metolazone 2.5 mg in the morning daily 30 minutes prior to taking his torsemide. He has close followup with Dr. Alphonsus Sias in 3 weeks time. We will see him 3 weeks after that or close monitoring. Repeat echocardiogram could be performed. I suspect his LV function is still severely depressed. Right-sided pressures likely moderately elevated. I am encouraged that weight has remained in the 160 range.

## 2013-05-13 NOTE — Assessment & Plan Note (Signed)
COPD likely contributing to his symptoms. He reports that he takes his inhalers. He would likely not qualify for oxygen unless oxygen is in the 80s. We will ambulate him on his next visit with oxygen monitoring

## 2013-05-13 NOTE — Telephone Encounter (Signed)
Spoke w/ pt.  He states that his wt is still up, stomach & feet are swollen. Dr. Alphonsus Sias told pt he needed to be seen asap. Pt sched to see Dr. Mariah Milling today at 3:45.

## 2013-05-16 ENCOUNTER — Ambulatory Visit: Payer: Medicare Other | Admitting: Cardiovascular Disease

## 2013-06-03 ENCOUNTER — Inpatient Hospital Stay (HOSPITAL_COMMUNITY)
Admission: EM | Admit: 2013-06-03 | Discharge: 2013-06-08 | DRG: 683 | Disposition: A | Discharge status: Home Health | Payer: Medicare Other | Attending: Internal Medicine | Admitting: Internal Medicine

## 2013-06-03 ENCOUNTER — Telehealth: Payer: Self-pay | Admitting: *Deleted

## 2013-06-03 ENCOUNTER — Emergency Department (HOSPITAL_COMMUNITY): Payer: Medicare Other

## 2013-06-03 ENCOUNTER — Encounter (HOSPITAL_COMMUNITY): Payer: Self-pay | Admitting: Emergency Medicine

## 2013-06-03 ENCOUNTER — Telehealth: Payer: Self-pay

## 2013-06-03 DIAGNOSIS — Z951 Presence of aortocoronary bypass graft: Secondary | ICD-10-CM

## 2013-06-03 DIAGNOSIS — Z8673 Personal history of transient ischemic attack (TIA), and cerebral infarction without residual deficits: Secondary | ICD-10-CM

## 2013-06-03 DIAGNOSIS — I10 Essential (primary) hypertension: Secondary | ICD-10-CM

## 2013-06-03 DIAGNOSIS — J4489 Other specified chronic obstructive pulmonary disease: Secondary | ICD-10-CM | POA: Diagnosis present

## 2013-06-03 DIAGNOSIS — N400 Enlarged prostate without lower urinary tract symptoms: Secondary | ICD-10-CM | POA: Diagnosis present

## 2013-06-03 DIAGNOSIS — N189 Chronic kidney disease, unspecified: Secondary | ICD-10-CM | POA: Diagnosis present

## 2013-06-03 DIAGNOSIS — I4891 Unspecified atrial fibrillation: Secondary | ICD-10-CM

## 2013-06-03 DIAGNOSIS — I429 Cardiomyopathy, unspecified: Secondary | ICD-10-CM

## 2013-06-03 DIAGNOSIS — R319 Hematuria, unspecified: Secondary | ICD-10-CM | POA: Diagnosis present

## 2013-06-03 DIAGNOSIS — I5022 Chronic systolic (congestive) heart failure: Secondary | ICD-10-CM

## 2013-06-03 DIAGNOSIS — E860 Dehydration: Secondary | ICD-10-CM | POA: Diagnosis present

## 2013-06-03 DIAGNOSIS — I480 Paroxysmal atrial fibrillation: Secondary | ICD-10-CM | POA: Diagnosis present

## 2013-06-03 DIAGNOSIS — I129 Hypertensive chronic kidney disease with stage 1 through stage 4 chronic kidney disease, or unspecified chronic kidney disease: Secondary | ICD-10-CM | POA: Diagnosis present

## 2013-06-03 DIAGNOSIS — I798 Other disorders of arteries, arterioles and capillaries in diseases classified elsewhere: Secondary | ICD-10-CM | POA: Diagnosis present

## 2013-06-03 DIAGNOSIS — I2510 Atherosclerotic heart disease of native coronary artery without angina pectoris: Secondary | ICD-10-CM

## 2013-06-03 DIAGNOSIS — N179 Acute kidney failure, unspecified: Principal | ICD-10-CM | POA: Diagnosis present

## 2013-06-03 DIAGNOSIS — J449 Chronic obstructive pulmonary disease, unspecified: Secondary | ICD-10-CM

## 2013-06-03 DIAGNOSIS — Z87891 Personal history of nicotine dependence: Secondary | ICD-10-CM

## 2013-06-03 DIAGNOSIS — E1159 Type 2 diabetes mellitus with other circulatory complications: Secondary | ICD-10-CM

## 2013-06-03 DIAGNOSIS — I509 Heart failure, unspecified: Secondary | ICD-10-CM | POA: Diagnosis present

## 2013-06-03 DIAGNOSIS — R531 Weakness: Secondary | ICD-10-CM

## 2013-06-03 DIAGNOSIS — E785 Hyperlipidemia, unspecified: Secondary | ICD-10-CM

## 2013-06-03 DIAGNOSIS — Z7901 Long term (current) use of anticoagulants: Secondary | ICD-10-CM

## 2013-06-03 LAB — CBC WITH DIFFERENTIAL/PLATELET
Basophils Absolute: 0 10*3/uL (ref 0.0–0.1)
Basophils Relative: 0 % (ref 0–1)
Eosinophils Absolute: 0.2 10*3/uL (ref 0.0–0.7)
Eosinophils Relative: 3 % (ref 0–5)
HCT: 37.6 % — ABNORMAL LOW (ref 39.0–52.0)
Hemoglobin: 13 g/dL (ref 13.0–17.0)
MCH: 30.2 pg (ref 26.0–34.0)
MCHC: 34.6 g/dL (ref 30.0–36.0)
Monocytes Relative: 14 % — ABNORMAL HIGH (ref 3–12)
Neutro Abs: 4.1 10*3/uL (ref 1.7–7.7)
Neutrophils Relative %: 72 % (ref 43–77)
Platelets: 103 10*3/uL — ABNORMAL LOW (ref 150–400)

## 2013-06-03 LAB — COMPREHENSIVE METABOLIC PANEL
ALT: 35 U/L (ref 0–53)
AST: 29 U/L (ref 0–37)
Albumin: 3.9 g/dL (ref 3.5–5.2)
Alkaline Phosphatase: 87 U/L (ref 39–117)
BUN: 117 mg/dL — ABNORMAL HIGH (ref 6–23)
Chloride: 97 mEq/L (ref 96–112)
GFR calc Af Amer: 28 mL/min — ABNORMAL LOW (ref >90)
Potassium: 5 mEq/L (ref 3.5–5.1)
Sodium: 131 mEq/L — ABNORMAL LOW (ref 135–145)
Total Bilirubin: 1.1 mg/dL (ref 0.3–1.2)
Total Protein: 7.7 g/dL (ref 6.0–8.3)

## 2013-06-03 LAB — URINALYSIS, ROUTINE W REFLEX MICROSCOPIC
Bilirubin Urine: NEGATIVE
Ketones, ur: NEGATIVE mg/dL
Protein, ur: 100 mg/dL — AB
Urobilinogen, UA: 0.2 mg/dL (ref 0.0–1.0)
pH: 5.5 (ref 5.0–8.0)

## 2013-06-03 LAB — URINE MICROSCOPIC-ADD ON

## 2013-06-03 LAB — TROPONIN I: Troponin I: <0.3 ng/mL (ref <0.30)

## 2013-06-03 LAB — PRO B NATRIURETIC PEPTIDE: Pro B Natriuretic peptide (BNP): 1390 pg/mL — ABNORMAL HIGH (ref 0–125)

## 2013-06-03 LAB — OCCULT BLOOD, POC DEVICE: Fecal Occult Bld: NEGATIVE

## 2013-06-03 MED ORDER — ALUM & MAG HYDROXIDE-SIMETH 200-200-20 MG/5ML PO SUSP
30.0000 mL | Freq: Four times a day (QID) | ORAL | Status: DC | PRN
  Filled 2013-06-03: qty 30

## 2013-06-03 MED ORDER — CARVEDILOL 6.25 MG PO TABS
6.2500 mg | ORAL_TABLET | Freq: Two times a day (BID) | ORAL | Status: DC
  Administered 2013-06-04 – 2013-06-08 (×9): 6.25 mg via ORAL
  Filled 2013-06-03 (×11): qty 1

## 2013-06-03 MED ORDER — ACETAMINOPHEN 650 MG RE SUPP: 650.0000 mg | Freq: Four times a day (QID) | RECTAL | Status: DC | PRN

## 2013-06-03 MED ORDER — SODIUM CHLORIDE 0.9 % IV SOLN: Freq: Once | INTRAVENOUS | Status: DC

## 2013-06-03 MED ORDER — INSULIN ASPART 100 UNIT/ML ~~LOC~~ SOLN
0.0000 [IU] | Freq: Three times a day (TID) | SUBCUTANEOUS | Status: DC
  Administered 2013-06-04: 3 [IU] via SUBCUTANEOUS
  Administered 2013-06-04: 5 [IU] via SUBCUTANEOUS
  Administered 2013-06-06 (×2): 2 [IU] via SUBCUTANEOUS
  Administered 2013-06-07: 1 [IU] via SUBCUTANEOUS
  Administered 2013-06-07: 3 [IU] via SUBCUTANEOUS
  Administered 2013-06-08: 1 [IU] via SUBCUTANEOUS
  Administered 2013-06-08: 2 [IU] via SUBCUTANEOUS

## 2013-06-03 MED ORDER — SODIUM CHLORIDE 0.9 % IJ SOLN
3.0000 mL | Freq: Two times a day (BID) | INTRAMUSCULAR | Status: DC
  Administered 2013-06-04 – 2013-06-07 (×7): 3 mL via INTRAVENOUS

## 2013-06-03 MED ORDER — DOCUSATE SODIUM 100 MG PO CAPS
100.0000 mg | ORAL_CAPSULE | Freq: Two times a day (BID) | ORAL | Status: DC
  Administered 2013-06-04 – 2013-06-08 (×9): 100 mg via ORAL
  Filled 2013-06-03 (×10): qty 1

## 2013-06-03 MED ORDER — ADULT MULTIVITAMIN W/MINERALS CH
1.0000 | ORAL_TABLET | Freq: Every day | ORAL | Status: DC
  Administered 2013-06-04 – 2013-06-08 (×5): 1 via ORAL
  Filled 2013-06-03 (×5): qty 1

## 2013-06-03 MED ORDER — ENOXAPARIN SODIUM 150 MG/ML ~~LOC~~ SOLN: 1.0000 mg/kg | Freq: Two times a day (BID) | SUBCUTANEOUS | Status: DC

## 2013-06-03 MED ORDER — INSULIN ASPART 100 UNIT/ML ~~LOC~~ SOLN: 0.0000 [IU] | Freq: Every day | SUBCUTANEOUS | Status: DC

## 2013-06-03 MED ORDER — LORAZEPAM 1 MG PO TABS: 0.5000 mg | ORAL_TABLET | Freq: Every evening | ORAL | Status: DC | PRN

## 2013-06-03 MED ORDER — SODIUM CHLORIDE 0.9 % IV SOLN
Freq: Once | INTRAVENOUS | Status: AC
  Administered 2013-06-03: 23:00:00 via INTRAVENOUS

## 2013-06-03 MED ORDER — ALBUTEROL SULFATE (5 MG/ML) 0.5% IN NEBU: 2.5000 mg | INHALATION_SOLUTION | Freq: Four times a day (QID) | RESPIRATORY_TRACT | Status: DC | PRN

## 2013-06-03 MED ORDER — ZOLPIDEM TARTRATE 5 MG PO TABS: 5.0000 mg | ORAL_TABLET | Freq: Every evening | ORAL | Status: DC | PRN

## 2013-06-03 MED ORDER — ONDANSETRON HCL 4 MG/2ML IJ SOLN: 4.0000 mg | Freq: Four times a day (QID) | INTRAMUSCULAR | Status: DC | PRN

## 2013-06-03 MED ORDER — OXYCODONE HCL 5 MG PO TABS
5.0000 mg | ORAL_TABLET | ORAL | Status: DC | PRN
  Filled 2013-06-03: qty 1

## 2013-06-03 MED ORDER — INSULIN DETEMIR 100 UNIT/ML ~~LOC~~ SOLN
100.0000 [IU] | Freq: Every morning | SUBCUTANEOUS | Status: DC
Start: 2013-06-04 — End: 2013-06-04
  Filled 2013-06-03: qty 1

## 2013-06-03 MED ORDER — SODIUM CHLORIDE 0.9 % IV SOLN: INTRAVENOUS | Status: DC

## 2013-06-03 MED ORDER — HYDROMORPHONE HCL PF 1 MG/ML IJ SOLN: 0.5000 mg | INTRAMUSCULAR | Status: DC | PRN

## 2013-06-03 MED ORDER — VITAMIN D 1000 UNITS PO CAPS: 1000.0000 [IU] | ORAL_CAPSULE | Freq: Every morning | ORAL | Status: DC

## 2013-06-03 MED ORDER — ATORVASTATIN CALCIUM 20 MG PO TABS
20.0000 mg | ORAL_TABLET | Freq: Every day | ORAL | Status: DC
  Administered 2013-06-04 – 2013-06-07 (×5): 20 mg via ORAL
  Filled 2013-06-03 (×6): qty 1

## 2013-06-03 MED ORDER — ISOSORBIDE MONONITRATE ER 30 MG PO TB24
30.0000 mg | ORAL_TABLET | Freq: Every day | ORAL | Status: DC
  Administered 2013-06-04 – 2013-06-07 (×5): 30 mg via ORAL
  Filled 2013-06-03 (×6): qty 1

## 2013-06-03 MED ORDER — ONDANSETRON HCL 4 MG PO TABS: 4.0000 mg | ORAL_TABLET | Freq: Four times a day (QID) | ORAL | Status: DC | PRN

## 2013-06-03 MED ORDER — ACETAMINOPHEN 325 MG PO TABS
650.0000 mg | ORAL_TABLET | Freq: Four times a day (QID) | ORAL | Status: DC | PRN
  Administered 2013-06-06: 650 mg via ORAL
  Filled 2013-06-03 (×2): qty 2

## 2013-06-03 MED ORDER — AMIODARONE HCL 200 MG PO TABS
200.0000 mg | ORAL_TABLET | Freq: Every morning | ORAL | Status: DC
  Administered 2013-06-04 – 2013-06-08 (×5): 200 mg via ORAL
  Filled 2013-06-03 (×5): qty 1

## 2013-06-03 NOTE — Telephone Encounter (Signed)
Pt given instructions by PCP and is being treated.

## 2013-06-03 NOTE — ED Provider Notes (Signed)
CSN: [id number]     Arrival date & time 06/03/13  2005 History   First MD Initiated Contact with Patient 06/03/13 2026     Chief Complaint  Patient presents with  . Hematuria   (Consider location/radiation/quality/duration/timing/severity/associated sxs/prior Treatment) Patient is a 72 y.o. male presenting with hematuria. The history is provided by the patient.  Hematuria This is a new problem. The current episode started 6 to 12 hours ago. The problem occurs constantly. The problem has not changed since onset.Associated symptoms include shortness of breath (w/ exertion). Pertinent negatives include no chest pain, no abdominal pain and no headaches. Associated symptoms comments: Generalized weakness. Nothing aggravates the symptoms. Nothing relieves the symptoms. He has tried nothing for the symptoms. The treatment provided no relief.    Past Medical History  Diagnosis Date  . Atrial fibrillation      S/P ablation  . CAD (coronary artery disease)     S/P MI 1980's----------------------Dr McDowell  . Hyperlipidemia   . CHF (congestive heart failure)   . 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 BPH (benign prostatic hypertrophy)   . Stroke   . Sleep disturbance    Past Surgical History  Procedure Laterality Date  . Coronary artery bypass graft    . Appendectomy    . Tonsillectomy    . Upper gastrointestinal endoscopy      Gastritis , ? H. pylori 08/28/1999  . Atrial ablation surgery     Family History  Problem Relation Age of Onset  . Parkinson's disease Mother   . Heart disease Father   . Skin cancer Sister   . Heart attack Mother    History  Substance Use Topics  . Smoking status: Former Smoker -- 1.00 packs/day    Types: Cigarettes    Quit date: 07/15/1973  . Smokeless tobacco: Never Used  . Alcohol Use: No    Review of Systems  Constitutional: Negative for fever.  HENT: Negative for drooling and rhinorrhea.   Eyes: Negative for  pain.  Respiratory: Positive for shortness of breath (w/ exertion). Negative for cough.   Cardiovascular: Negative for chest pain and leg swelling.  Gastrointestinal: Negative for nausea, vomiting, abdominal pain and diarrhea.  Genitourinary: Positive for hematuria. Negative for dysuria.  Musculoskeletal: Negative for gait problem and neck pain.  Skin: Negative for color change.  Neurological: Negative for numbness and headaches.  Hematological: Negative for adenopathy.  Psychiatric/Behavioral: Negative for behavioral problems.  All other systems reviewed and are negative.    Allergies  Review of patient's allergies indicates no known allergies.  Home Medications   Current Outpatient Rx  Name  Route  Sig  Dispense  Refill  . albuterol (PROVENTIL) (5 MG/ML) 0.5% nebulizer solution   Nebulization   Take 0.5 mLs (2.5 mg total) by nebulization every 6 (six) hours as needed for wheezing or shortness of breath.   150 mL   1   . amiodarone (PACERONE) 200 MG tablet   Oral   Take 200 mg by mouth every morning.         Marland Kitchen aspirin EC 81 MG tablet   Oral   Take 81 mg by mouth every morning.          Marland Kitchen atorvastatin (LIPITOR) 20 MG tablet   Oral   Take 20 mg by mouth at bedtime.          . carvedilol (COREG) 6.25 MG tablet   Oral   Take 1 tablet (  6.25 mg total) by mouth 2 (two) times daily with a meal.   60 tablet   5   . Cholecalciferol (VITAMIN D) 1000 UNITS capsule   Oral   Take 1,000 Units by mouth every morning.         Tery Sanfilippo Calcium (STOOL SOFTENER PO)   Oral   Take 1 capsule by mouth 2 (two) times daily.         . insulin detemir (LEVEMIR) 100 UNIT/ML injection   Subcutaneous   Inject 100 Units into the skin every morning.         . isosorbide mononitrate (IMDUR) 30 MG 24 hr tablet   Oral   Take 30 mg by mouth at bedtime.         Marland Kitchen LORazepam (ATIVAN) 0.5 MG tablet   Oral   Take 0.5-1 mg by mouth at bedtime as needed (for sleep).          .  Multiple Vitamin (MULTIVITAMIN WITH MINERALS) TABS   Oral   Take 1 tablet by mouth daily.         . quinapril (ACCUPRIL) 10 MG tablet   Oral   Take 10 mg by mouth at bedtime.         . torsemide (DEMADEX) 20 MG tablet   Oral   Take 2 tablets (40 mg total) by mouth 2 (two) times daily. Hold afternoon dose if your weight at home is under 160#   120 tablet   11   . warfarin (COUMADIN) 5 MG tablet   Oral   Take 5-7.5 mg by mouth daily. Takes 5mg  every day of week, except Monday-7.5mg          . metolazone (ZAROXOLYN) 2.5 MG tablet   Oral   Take 1 tablet (2.5 mg total) by mouth daily.   30 tablet   0     Take for 3 days when you are instructed to    BP 101/51  Pulse 61  Temp(Src) 97.7 F (36.5 C) (Oral)  Resp 20  SpO2 99% Physical Exam  Nursing note and vitals reviewed. Constitutional: He is oriented to person, place, and time. He appears well-developed and well-nourished.  HENT:  Head: Normocephalic and atraumatic.  Right Ear: External ear normal.  Left Ear: External ear normal.  Nose: Nose normal.  Mouth/Throat: Oropharynx is clear and moist. No oropharyngeal exudate.  Eyes: Conjunctivae and EOM are normal. Pupils are equal, round, and reactive to light.  Neck: Normal range of motion. Neck supple.  Cardiovascular: Normal rate, regular rhythm, normal heart sounds and intact distal pulses.  Exam reveals no gallop and no friction rub.   No murmur heard. Pulmonary/Chest: Effort normal and breath sounds normal. No respiratory distress. He has no wheezes.  Abdominal: Soft. Bowel sounds are normal. He exhibits no distension. There is no tenderness. There is no rebound and no guarding.  Genitourinary:  Normal appearing external rectum. Mildly enlarge prostate. Brown stool. No gross blood.   Musculoskeletal: Normal range of motion. He exhibits no edema and no tenderness.  Neurological: He is alert and oriented to person, place, and time.  Skin: Skin is warm and dry.   Psychiatric: He has a normal mood and affect. His behavior is normal.    ED Course  Procedures (including critical care time) Labs Review Labs Reviewed  URINALYSIS, ROUTINE W REFLEX MICROSCOPIC - Abnormal; Notable for the following:    Color, Urine RED (*)    APPearance CLOUDY (*)  Hgb urine dipstick LARGE (*)    Protein, ur 100 (*)    Leukocytes, UA SMALL (*)    All other components within normal limits  CBC WITH DIFFERENTIAL - Abnormal; Notable for the following:    HCT 37.6 (*)    RDW 16.4 (*)    Platelets 103 (*)    Lymphocytes Relative 10 (*)    Lymphs Abs 0.6 (*)    Monocytes Relative 14 (*)    All other components within normal limits  COMPREHENSIVE METABOLIC PANEL - Abnormal; Notable for the following:    Sodium 131 (*)    Glucose, Bld 160 (*)    BUN 117 (*)    Creatinine, Ser 2.50 (*)    GFR calc non Af Amer 24 (*)    GFR calc Af Amer 28 (*)    All other components within normal limits  PROTIME-INR - Abnormal; Notable for the following:    Prothrombin Time 33.8 (*)    INR 3.50 (*)    All other components within normal limits  PRO B NATRIURETIC PEPTIDE - Abnormal; Notable for the following:    Pro B Natriuretic peptide (BNP) 1390.0 (*)    All other components within normal limits  URINE MICROSCOPIC-ADD ON  TROPONIN I  OCCULT BLOOD, POC DEVICE   Imaging Review Dg Chest 2 View  06/03/2013   CLINICAL DATA:  Chest pain  EXAM: CHEST  2 VIEW  COMPARISON:  11/27/2012  FINDINGS: Chronic cardiomegaly. Status post CABG. Cephalized blood flow and central vascular prominence. Chronic bronchitic changes. No edema or asymmetric opacity. No effusion or pneumothorax.  IMPRESSION: 1. Cardiomegaly and pulmonary venous congestion. 2. COPD.   Electronically Signed   By: Tiburcio Pea M.D.   On: 06/03/2013 22:40     Date: 06/03/2013  Rate: 63  Rhythm: atrial fibrillation  QRS Axis: left  Intervals: PR indeterminate, QTc 484 ms  ST/T Wave abnormalities: normal  Conduction  Disutrbances:none  Narrative Interpretation: LVH w/ repol abnormality  Old EKG Reviewed: changes noted    MDM   1. Hematuria   2. Generalized weakness   3. Acute renal failure    9:26 PM 72 y.o. male w hx of afib on coumadin, CHF, CAD who presents with hematuria. He also notes generalized weakness and mild shortness of breath with exertion for the past 2-3 weeks. He denies any pain. He is afebrile and vital signs are unremarkable here. Will get screening labwork.  Will admit to hospitalist.     Junius Argyle, MD 06/03/13 2326

## 2013-06-03 NOTE — Telephone Encounter (Signed)
Noted. plz call on Monday for an update. 

## 2013-06-03 NOTE — Telephone Encounter (Signed)
INR may be running high Could hold warfarin for 2 days, then restart warfarin Any more bleeding, would hold warfarin and call the office

## 2013-06-03 NOTE — ED Notes (Addendum)
Pt states that he ha noticed blood in his urine since yesterday. Pt was told to come in by PCP to be checked out. Pt on blood thinner warfarin. Pt states slight nausea but denies pain anywhere

## 2013-06-03 NOTE — Telephone Encounter (Signed)
Patient called and is urinating blood. He does have a call into Dr. Karle Starch office as well. Please advise patient

## 2013-06-03 NOTE — Telephone Encounter (Signed)
Pt said blood in urine started earlier today; bright red blood in urine x 3 today 1st time appeared to be mostly blood but not as bright red colored last time urinated. Pt said has never happened before. No dizziness or h/a. Pt feels weak. Pt has not taken warfarin today. Blair RN team lead advised pt not to take Warfarin until seen by doctor and advised by doctor about taking warfarin; pt to [facility] in [place] now.

## 2013-06-03 NOTE — Telephone Encounter (Signed)
Spoke w/ pt.  He states that he "urinated pure blood" this morning.  Denies any other bleeding. He just urinated and states that it is clearer now.  He has a call into Dr. Karle Starch office, as well.     Dr. Karle Starch office handles his coumadin dosing.  He would appreciate Dr. Windell Hummingbird advice before the weekend.  Please advise.  Thank you.

## 2013-06-03 NOTE — H&P (Signed)
[facility] History and Physical  [name] ZOX:[id number] DOB: 1941/03/23 DOA: 06/03/2013  Referring physician:   EDP PCP: Tillman Abide, MD  Specialists:   Chief Complaint: Dark Urine  HPI: [name] is a 72 y.o. male who presented to the ED with complaints of  Blood in his urine that has been for the past 3 days .  He has also had SOB, and Weakness over the past week.   He has Atrial fibrillation and is on long-term Coumadin Rx, and he called his PCP and had been advised to hold his coumadin for 2 days, and if worsening to go the the ED.   He was evaluated in the ED and was observed to have tea colored Urine, and his INR was found to be 3.50.   He also was found to have an elevated BUN/Cr compares to a Cr = 1.6 3 months ago.      Review of Systems: The patient denies anorexia, fever, chills, headaches, weight loss,, vision loss, diplopia, dizziness, decreased hearing, rhinitis, hoarseness, chest pain, syncope, balance deficits, cough, hemoptysis, abdominal pain, nausea, vomiting, diarrhea, constipation, hematemesis, melena, hematochezia, severe indigestion/heartburn, dysuria, hematuria, incontinence, muscle weakness, suspicious skin lesions, transient blindness, difficulty walking, depression, unusual weight change, abnormal bleeding, enlarged lymph nodes, angioedema, and breast masses.    Past Medical History  Diagnosis Date  . Atrial fibrillation      S/P ablation  . CAD (coronary artery disease)     S/P MI 1980's----------------------Dr McDowell  . Hyperlipidemia   . CHF (congestive heart failure)   . 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 BPH (benign prostatic hypertrophy)   . Stroke   . Sleep disturbance     Past Surgical History  Procedure Laterality Date  . Coronary artery bypass graft    . Appendectomy    . Tonsillectomy    . Upper gastrointestinal endoscopy      Gastritis , ? H. pylori 08/28/1999  . Atrial ablation  surgery      Prior to Admission medications   Medication Sig Start Date End Date Taking? Authorizing Provider  albuterol (PROVENTIL) (5 MG/ML) 0.5% nebulizer solution Take 0.5 mLs (2.5 mg total) by nebulization every 6 (six) hours as needed for wheezing or shortness of breath. 03/14/13  Yes Karie Schwalbe, MD  amiodarone (PACERONE) 200 MG tablet Take 200 mg by mouth every morning.   Yes Historical Provider, MD  aspirin EC 81 MG tablet Take 81 mg by mouth every morning.    Yes Historical Provider, MD  atorvastatin (LIPITOR) 20 MG tablet Take 20 mg by mouth at bedtime.    Yes Historical Provider, MD  carvedilol (COREG) 6.25 MG tablet Take 1 tablet (6.25 mg total) by mouth 2 (two) times daily with a meal. 11/10/12  Yes Antonieta Iba, MD  Cholecalciferol (VITAMIN D) 1000 UNITS capsule Take 1,000 Units by mouth every morning.   Yes Historical Provider, MD  Docusate Calcium (STOOL SOFTENER PO) Take 1 capsule by mouth 2 (two) times daily.   Yes Historical Provider, MD  insulin detemir (LEVEMIR) 100 UNIT/ML injection Inject 100 Units into the skin every morning.   Yes Historical Provider, MD  isosorbide mononitrate (IMDUR) 30 MG 24 hr tablet Take 30 mg by mouth at bedtime.   Yes Historical Provider, MD  LORazepam (ATIVAN) 0.5 MG tablet Take 0.5-1 mg by mouth at bedtime as needed (for sleep).    Yes Historical Provider, MD  Multiple  Vitamin (MULTIVITAMIN WITH MINERALS) TABS Take 1 tablet by mouth daily.   Yes Historical Provider, MD  quinapril (ACCUPRIL) 10 MG tablet Take 10 mg by mouth at bedtime.   Yes Historical Provider, MD  torsemide (DEMADEX) 20 MG tablet Take 2 tablets (40 mg total) by mouth 2 (two) times daily. Hold afternoon dose if your weight at home is under 160# 05/05/13  Yes Karie Schwalbe, MD  warfarin (COUMADIN) 5 MG tablet Take 5-7.5 mg by mouth daily. Takes 5mg  every day of week, except Monday-7.5mg    Yes Historical Provider, MD  metolazone (ZAROXOLYN) 2.5 MG tablet Take 1 tablet (2.5  mg total) by mouth daily. 05/12/13   Karie Schwalbe, MD    No Known Allergies  Social History:  reports that he quit smoking about 39 years ago. His smoking use included Cigarettes. He smoked 1.00 pack per day. He has never used smokeless tobacco. He reports that he does not drink alcohol or use illicit drugs.     Family History  Problem Relation Age of Onset  . Parkinson's disease Mother   . Heart disease Father   . Skin cancer Sister   . Heart attack Mother       Physical Exam:  GEN:  Pleasant Well nourished and Well developed Elderly  72 y.o.  Caucasian male  examined  and in no acute distress; cooperative with exam Filed Vitals:   06/03/13 2100 06/03/13 2115 06/03/13 2307 06/03/13 2315  BP: 101/51 103/51 114/37 114/50  Pulse: 61 59 56 61  Temp: 97.7 F (36.5 C)     TempSrc: Oral     Resp: 20 21 17 15   SpO2: 99% 96% 100% 100%   Blood pressure 114/50, pulse 61, temperature 97.7 F (36.5 C), temperature source Oral, resp. rate 15, SpO2 100.00%. PSYCH: He is alert and oriented x4; does not appear anxious does not appear depressed; affect is normal HEENT: Normocephalic and Atraumatic, Mucous membranes pink; PERRLA; EOM intact; Fundi:  Benign;  No scleral icterus, Nares: Patent, Oropharynx: Clear, Edentulous, Neck:  FROM, no cervical lymphadenopathy nor thyromegaly or carotid bruit; no JVD; Breasts:: Not examined CHEST WALL: No tenderness CHEST: Normal respiration, clear to auscultation bilaterally HEART: Regular rate and rhythm; no murmurs rubs or gallops BACK: No kyphosis or scoliosis; no CVA tenderness ABDOMEN: Positive Bowel Sounds,  soft non-tender; no masses, no organomegaly, no pannus; no intertriginous candida. Rectal Exam: Not done EXTREMITIES: No cyanosis, clubbing or edema; no ulcerations. Genitalia: not examined PULSES: 2+ and symmetric SKIN: Normal hydration no rash or ulceration,  Hyperpigmented BLEs ( due to long-term coumadin Rx).   CNS: Cranial nerves 2-12  grossly intact no focal neurologic deficit    Labs on Admission:  Basic Metabolic Panel:  Recent Labs Lab 06/03/13 2108  NA 131*  K 5.0  CL 97  CO2 21  GLUCOSE 160*  BUN 117*  CREATININE 2.50*  CALCIUM 9.1   Liver Function Tests:  Recent Labs Lab 06/03/13 2108  AST 29  ALT 35  ALKPHOS 87  BILITOT 1.1  PROT 7.7  ALBUMIN 3.9   No results found for this basename: LIPASE, AMYLASE,  in the last 168 hours No results found for this basename: AMMONIA,  in the last 168 hours CBC:  Recent Labs Lab 06/03/13 2108  WBC 5.7  NEUTROABS 4.1  HGB 13.0  HCT 37.6*  MCV 87.4  PLT 103*   Cardiac Enzymes:  Recent Labs Lab 06/03/13 2108  TROPONINI <0.30    BNP (last  3 results)  Recent Labs  11/27/12 1405 11/29/12 0655 06/03/13 2108  PROBNP 1210.0* 1278.0* 1390.0*   CBG: No results found for this basename: GLUCAP,  in the last 168 hours  Radiological Exams on Admission: Dg Chest 2 View  06/03/2013   CLINICAL DATA:  Chest pain  EXAM: CHEST  2 VIEW  COMPARISON:  11/27/2012  FINDINGS: Chronic cardiomegaly. Status post CABG. Cephalized blood flow and central vascular prominence. Chronic bronchitic changes. No edema or asymmetric opacity. No effusion or pneumothorax.  IMPRESSION: 1. Cardiomegaly and pulmonary venous congestion. 2. COPD.   Electronically Signed   By: Tiburcio Pea M.D.   On: 06/03/2013 22:40     EKG: Independently reviewed.    Assessment/Plan Principal Problem:   Acute renal failure Active Problems:   Hematuria   HYPERLIPIDEMIA   CORONARY ARTERY DISEASE   Atrial fibrillation   HTN (hypertension)   Type II or unspecified type diabetes mellitus with peripheral circulatory disorders, uncontrolled(250.72)   COPD (chronic obstructive pulmonary disease)   Chronic systolic heart failure   Generalized weakness      1.  Acute Renal Failure/ Dehydration- Gentle IVFs, and monitor BUN/Cr, Hold Ace Inhibitor Rx and Diuretics,  may need Nephrology  evaluation if no Improvement.   Renal US ordered.    2.  Hematuria- Possibly due to Elevated PT/INR so Coumadin on Hold, however send Urine C+S, and Renal US ordered.   3.   CAD- stable, on Imdur, and Carvedilol.  ASA on Hold due to hematuria.     4.   COPD -Stable continue Albuterol Nebs PRN.    5.   Chronic Systolic CHF- Monitor for signs of Overload.  Continue Carvedilol, Demadex and Metolazone (diuretics on Hold).   Had 2-D ECHO 11/2012  6.   Atrial fibrillation- On Amiodarone,  Coumadin on hold due to Hematuria.      7.   HTN-  On Carvedilol, Demadex, Metolazone,   8.  DM2 on Insulin-  Continue Levermir, and Add SSI coverage PRN, Check HbA1C.    9.   SCDs for DVT prophylaxis.     10.  Longterm use of Coumadin-  Monitor Pt/INR,  Coumadin on hold due to Hematuria.                 Code Status:  FULL CODE Family Communication:    No Family at Bedside Disposition Plan:     Inpatient  Time spent:  32 Minutes  Ron Parker [facility] Pager [phone]  If 7PM-7AM, please contact night-coverage [email or web address] Password [email or web address] 06/03/2013, 11:55 PM

## 2013-06-04 ENCOUNTER — Encounter (HOSPITAL_COMMUNITY): Payer: Self-pay | Admitting: General Practice

## 2013-06-04 ENCOUNTER — Observation Stay (HOSPITAL_COMMUNITY): Payer: Medicare Other

## 2013-06-04 DIAGNOSIS — D494 Neoplasm of unspecified behavior of bladder: Secondary | ICD-10-CM

## 2013-06-04 HISTORY — DX: Neoplasm of unspecified behavior of bladder: D49.4

## 2013-06-04 LAB — HEMOGLOBIN AND HEMATOCRIT, BLOOD
HCT: 35.3 % — ABNORMAL LOW (ref 39.0–52.0)
HCT: 37.2 % — ABNORMAL LOW (ref 39.0–52.0)
Hemoglobin: 12.5 g/dL — ABNORMAL LOW (ref 13.0–17.0)
Hemoglobin: 12.1 g/dL — ABNORMAL LOW (ref 13.0–17.0)
Hemoglobin: 12.5 g/dL — ABNORMAL LOW (ref 13.0–17.0)

## 2013-06-04 LAB — CBC
HCT: 36.1 % — ABNORMAL LOW (ref 39.0–52.0)
MCV: 87.6 fL (ref 78.0–100.0)
RBC: 4.12 MIL/uL — ABNORMAL LOW (ref 4.22–5.81)
WBC: 4.3 10*3/uL (ref 4.0–10.5)

## 2013-06-04 LAB — BASIC METABOLIC PANEL
BUN: 104 mg/dL — ABNORMAL HIGH (ref 6–23)
CO2: 24 mEq/L (ref 19–32)
Calcium: 9.3 mg/dL (ref 8.4–10.5)
Chloride: 102 mEq/L (ref 96–112)
GFR calc Af Amer: 38 mL/min — ABNORMAL LOW (ref >90)
Sodium: 137 mEq/L (ref 135–145)

## 2013-06-04 LAB — GLUCOSE, CAPILLARY
Glucose-Capillary: 91 mg/dL (ref 70–99)
Glucose-Capillary: 213 mg/dL — ABNORMAL HIGH (ref 70–99)
Glucose-Capillary: 99 mg/dL (ref 70–99)

## 2013-06-04 LAB — HEMOGLOBIN A1C: Hgb A1c MFr Bld: 7.1 % — ABNORMAL HIGH (ref <5.7)

## 2013-06-04 MED ORDER — INSULIN DETEMIR 100 UNIT/ML ~~LOC~~ SOLN
100.0000 [IU] | Freq: Every morning | SUBCUTANEOUS | Status: DC
  Administered 2013-06-05 – 2013-06-06 (×2): 100 [IU] via SUBCUTANEOUS
  Filled 2013-06-04 (×3): qty 1

## 2013-06-04 MED ORDER — SODIUM CHLORIDE 0.9 % IV SOLN: INTRAVENOUS | Status: AC

## 2013-06-04 MED ORDER — INSULIN DETEMIR 100 UNIT/ML ~~LOC~~ SOLN
50.0000 [IU] | Freq: Every morning | SUBCUTANEOUS | Status: DC
  Administered 2013-06-04: 50 [IU] via SUBCUTANEOUS
  Filled 2013-06-04: qty 0.5

## 2013-06-04 MED ORDER — VITAMIN D3 25 MCG (1000 UNIT) PO TABS
1000.0000 [IU] | ORAL_TABLET | Freq: Every day | ORAL | Status: DC
  Administered 2013-06-04 – 2013-06-08 (×5): 1000 [IU] via ORAL
  Filled 2013-06-04 (×6): qty 1

## 2013-06-04 NOTE — Progress Notes (Signed)
Nutrition Brief Note  Patient identified on the Malnutrition Screening Tool (MST) Report. Pt reports weight loss 2/2 "taking a new fluid pill." Endorses a great appetite and is presently asking when he can eat. Denies any nutrition concerns.  Wt Readings from Last 15 Encounters:  06/04/13 152 lb 14.4 oz (69.355 kg)  05/13/13 168 lb (76.204 kg)  05/12/13 165 lb 8 oz (75.07 kg)  05/05/13 165 lb (74.844 kg)  04/14/13 165 lb (74.844 kg)  03/14/13 156 lb (70.761 kg)  03/03/13 172 lb (78.019 kg)  02/21/13 169 lb (76.658 kg)  02/17/13 175 lb (79.379 kg)  12/16/12 154 lb 4 oz (69.967 kg)  11/30/12 155 lb 3.2 oz (70.398 kg)  11/25/12 164 lb (74.39 kg)  11/24/12 163 lb 12 oz (74.277 kg)  11/01/12 161 lb (73.029 kg)  08/19/12 157 lb (71.215 kg)    Body mass index is 22.57 kg/(m^2). Patient meets criteria for normal weight based on current BMI.   Current diet order is NPO. Labs and medications reviewed.   No nutrition interventions warranted at this time. If nutrition issues arise, please consult RD.   Jarold Motto MS, RD, LDN Pager: [phone] After-hours pager: [phone]

## 2013-06-04 NOTE — Progress Notes (Signed)
Text page Dr. Blake Divine to request clarification on patient am dose of levemir , blood sugar 91 and patient is NPO.  New order received.

## 2013-06-04 NOTE — Progress Notes (Signed)
[facility] PROGRESS NOTE  [name] ZOX:[id number] DOB: 1941-06-26 DOA: 06/03/2013 PCP: [name] Abide, MD  Assessment/Plan: 1. Acute Renal Failure/ Dehydration- Gentle IVFs, and monitor BUN/Cr, Hold Ace Inhibitor Rx and Diuretics, . Renal US ordered negative for hydronephrosis.  2. Hematuria- Possibly due to Elevated PT/INR so Coumadin on Hold, however send Urine C+S, and Renal US ordered.  3. CAD- stable, on Imdur, and Carvedilol. ASA on Hold due to hematuria.  4. COPD -Stable continue Albuterol Nebs PRN.  5. Chronic Systolic CHF- Monitor for signs of Overload. Continue Carvedilol, Demadex and Metolazone (diuretics on Hold). Had 2-D ECHO 11/2012  6. Atrial fibrillation- On Amiodarone, Coumadin on hold due to Hematuria.  7. HTN- On Carvedilol, Demadex, Metolazone,  8. DM2 on Insulin- Continue Levermir, and Add SSI coverage PRN, Check HbA1C.  CBG (last 3)   Recent Labs  06/04/13 0045 06/04/13 0806 06/04/13 1137  GLUCAP 201* 91 265*     9. SCDs for DVT prophylaxis.  10. Longterm use of Coumadin- Monitor Pt/INR, Coumadin on hold due to Hematuria.      Code Status: full code Family Communication: none at bedside Disposition Plan: pending.    Consultants:  none  Procedures:  US RENAL no hydronephrosis.   Antibiotics:  none  HPI/Subjective: No pain, no nausea or vomiting.   Objective: Filed Vitals:   06/04/13 1000  BP: 93/48  Pulse: 64  Temp: 97.7 F (36.5 C)  Resp: 18    Intake/Output Summary (Last 24 hours) at 06/04/13 1128 Last data filed at 06/04/13 0900  Gross per 24 hour  Intake    240 ml  Output   2275 ml  Net  -2035 ml   Filed Weights   06/04/13 0031  Weight: 69.355 kg (152 lb 14.4 oz)    Exam:  Alert afebrile comfortable HEART: Regular rate and rhythm; no murmurs rubs or gallops  CHEST: clear to auscultation bilaterally.  ABDOMEN: Positive Bowel Sounds, soft non-tender; .    Data Reviewed: Basic Metabolic  Panel:  Recent Labs Lab 06/03/13 2108 06/04/13 0600  NA 131* 137  K 5.0 4.4  CL 97 102  CO2 21 24  GLUCOSE 160* 112*  BUN 117* 104*  CREATININE 2.50* 1.94*  CALCIUM 9.1 9.3   Liver Function Tests:  Recent Labs Lab 06/03/13 2108  AST 29  ALT 35  ALKPHOS 87  BILITOT 1.1  PROT 7.7  ALBUMIN 3.9   No results found for this basename: LIPASE, AMYLASE,  in the last 168 hours No results found for this basename: AMMONIA,  in the last 168 hours CBC:  Recent Labs Lab 06/03/13 2108 06/03/13 2350 06/04/13 0600 06/04/13 0818  WBC 5.7  --  4.3  --   NEUTROABS 4.1  --   --   --   HGB 13.0 12.5* 12.2* 12.1*  HCT 37.6* 35.9* 36.1* 35.3*  MCV 87.4  --  87.6  --   PLT 103*  --  109*  --    Cardiac Enzymes:  Recent Labs Lab 06/03/13 2108  TROPONINI <0.30   BNP (last 3 results)  Recent Labs  11/27/12 1405 11/29/12 0655 06/03/13 2108  PROBNP 1210.0* 1278.0* 1390.0*   CBG:  Recent Labs Lab 06/04/13 0045 06/04/13 0806  GLUCAP 201* 91    No results found for this or any previous visit (from the past 240 hour(s)).   Studies: Dg Chest 2 View  06/03/2013   CLINICAL DATA:  Chest pain  EXAM: CHEST  2 VIEW  COMPARISON:  11/27/2012  FINDINGS: Chronic cardiomegaly. Status post CABG. Cephalized blood flow and central vascular prominence. Chronic bronchitic changes. No edema or asymmetric opacity. No effusion or pneumothorax.  IMPRESSION: 1. Cardiomegaly and pulmonary venous congestion. 2. COPD.   Electronically Signed   By: [name] M.D.   On: 06/03/2013 22:40   US Renal  06/04/2013   CLINICAL DATA:  Hematuria.  EXAM: RENAL/URINARY TRACT ULTRASOUND COMPLETE  COMPARISON:  Abdominal ultrasound 11/04/2012. Marland Kitchen  FINDINGS: Right Kidney  Length: 12 cm long axis. 4 mm nonobstructing upper pole renal collecting system calculus. No hydronephrosis. Otherwise the central sinus echo complex appears normal.  Left Kidney  Length: 13.5 cm long axis. Echogenicity within normal limits.  No mass or hydronephrosis visualized.  Bladder  Appears normal for degree of bladder distention.  Small amount of ascites is incidentally noted.  IMPRESSION: 1. Negative for hydronephrosis. Nonobstructing right upper pole renal collecting system calculus measuring 4 mm. 2. Small volume ascites. 3. Mild enlargement of the left kidney may be related to body habitus.   Electronically Signed   By: [name] M.D.   On: 06/04/2013 07:12    Scheduled Meds: . sodium chloride   Intravenous STAT  . amiodarone  200 mg Oral q morning - 10a  . atorvastatin  20 mg Oral QHS  . carvedilol  6.25 mg Oral BID WC  . cholecalciferol  1,000 Units Oral Daily  . docusate sodium  100 mg Oral BID  . insulin aspart  0-5 Units Subcutaneous QHS  . insulin aspart  0-9 Units Subcutaneous TID WC  . insulin detemir  50 Units Subcutaneous q morning - 10a  . isosorbide mononitrate  30 mg Oral QHS  . multivitamin with minerals  1 tablet Oral Daily  . sodium chloride  3 mL Intravenous Q12H   Continuous Infusions: . sodium chloride 75 mL/hr at 06/04/13 0146    Principal Problem:   Acute renal failure Active Problems:   HYPERLIPIDEMIA   CORONARY ARTERY DISEASE   Atrial fibrillation   HTN (hypertension)   Type II or unspecified type diabetes mellitus with peripheral circulatory disorders, uncontrolled(250.72)   COPD (chronic obstructive pulmonary disease)   Chronic systolic heart failure   Hematuria   Generalized weakness    Time spent: 25 min    [name]  [facility] Pager [phone]. If 7PM-7AM, please contact night-coverage at [email or web address], password [facility] 06/04/2013, 11:28 AM  LOS: 1 day

## 2013-06-05 DIAGNOSIS — I428 Other cardiomyopathies: Secondary | ICD-10-CM

## 2013-06-05 LAB — GLUCOSE, CAPILLARY
Glucose-Capillary: 114 mg/dL — ABNORMAL HIGH (ref 70–99)
Glucose-Capillary: 133 mg/dL — ABNORMAL HIGH (ref 70–99)
Glucose-Capillary: 86 mg/dL (ref 70–99)

## 2013-06-05 LAB — URINALYSIS, ROUTINE W REFLEX MICROSCOPIC
Bilirubin Urine: NEGATIVE
Ketones, ur: NEGATIVE mg/dL
Nitrite: NEGATIVE
Protein, ur: 30 mg/dL — AB
Specific Gravity, Urine: 1.016 (ref 1.005–1.030)
pH: 5.5 (ref 5.0–8.0)

## 2013-06-05 LAB — HEMOGLOBIN AND HEMATOCRIT, BLOOD
HCT: 35.4 % — ABNORMAL LOW (ref 39.0–52.0)
HCT: 38.1 % — ABNORMAL LOW (ref 39.0–52.0)
Hemoglobin: 12.3 g/dL — ABNORMAL LOW (ref 13.0–17.0)
Hemoglobin: 13 g/dL (ref 13.0–17.0)

## 2013-06-05 LAB — BASIC METABOLIC PANEL
BUN: 72 mg/dL — ABNORMAL HIGH (ref 6–23)
Calcium: 8.8 mg/dL (ref 8.4–10.5)
Chloride: 101 mEq/L (ref 96–112)
GFR calc Af Amer: 46 mL/min — ABNORMAL LOW (ref >90)
Potassium: 5 mEq/L (ref 3.5–5.1)

## 2013-06-05 LAB — PROTIME-INR
INR: 2.12 — ABNORMAL HIGH (ref 0.00–1.49)
Prothrombin Time: 23.1 seconds — ABNORMAL HIGH (ref 11.6–15.2)

## 2013-06-05 MED ORDER — WARFARIN SODIUM 5 MG PO TABS
5.0000 mg | ORAL_TABLET | Freq: Once | ORAL | Status: AC
  Administered 2013-06-05: 5 mg via ORAL
  Filled 2013-06-05: qty 1

## 2013-06-05 MED ORDER — WARFARIN - PHARMACIST DOSING INPATIENT
Freq: Every day | Status: DC
  Administered 2013-06-05: 18:00:00

## 2013-06-05 NOTE — Progress Notes (Signed)
[facility] PROGRESS NOTE  [name] UJW:[id number] DOB: 07-Mar-1941 DOA: 06/03/2013 PCP: Tillman Abide, MD Brief HPI: [name] is a 72 y.o. male who presented to the ED with complaints of Blood in his urine that has been for the past 3 days . He has also had SOB, and Weakness over the past week. He has Atrial fibrillation and is on long-term Coumadin Rx, and he called his PCP and had been advised to hold his coumadin for 2 days, and if worsening to go the the ED. He was evaluated in the ED and was observed to have tea colored Urine, and his INR was found to be 3.50. He also was found to have an elevated BUN/Cr compares to a Cr = 1.6 3 months ago.   Assessment/Plan: 1. Acute Renal Failure/ Dehydration- he was started on IV fluids and his creatinine improved. Holding  Hold Ace Inhibitor Rx and Diuretics, . Renal US ordered negative for hydronephrosis. Repeat labs in am.  2. Hematuria- Possibly due to Elevated PT/INR so Coumadin on Hold, however send Urine C+S, and Renal US ordered and negative for hydronephrosis. Hematuria is clearing up.  3. CAD- stable, on Imdur, and Carvedilol. ASA on Hold due to hematuria.  4. COPD -Stable continue Albuterol Nebs PRN.  5. Chronic Systolic CHF- Monitor for signs of Overload. Continue Carvedilol, Demadex and Metolazone (diuretics on Hold). Had 2-D ECHO 11/2012  6. Atrial fibrillation- On Amiodarone, Coumadin on hold due to Hematuria.  7. HTN- On Carvedilol, Demadex, Metolazone,  8. DM2 on Insulin- Continue Levermir, and Add SSI coverage PRN,  HbA1C is 7.1. He is on 100 units of levamir and monitor for hypoglycemia.   CBG (last 3)   Recent Labs  06/04/13 2106 06/05/13 0748 06/05/13 0911  GLUCAP 99 60* 114*     9. SCDs for DVT prophylaxis.   10. Longterm use of Coumadin- Monitor Pt/INR, Coumadin on hold due to Hematuria.      Code Status: full code Family Communication: none at bedside Disposition Plan: pending.     Consultants:  none  Procedures:  US RENAL no hydronephrosis.   Antibiotics:  none  HPI/Subjective: No pain, no nausea or vomiting.   Objective: Filed Vitals:   06/05/13 0946  BP: 96/48  Pulse: 56  Temp: 97.8 F (36.6 C)  Resp: 18    Intake/Output Summary (Last 24 hours) at 06/05/13 1118 Last data filed at 06/05/13 0900  Gross per 24 hour  Intake   1320 ml  Output   2700 ml  Net  -1380 ml   Filed Weights   06/04/13 0031 06/04/13 2111  Weight: 69.355 kg (152 lb 14.4 oz) 69.899 kg (154 lb 1.6 oz)    Exam:  Alert afebrile comfortable HEART: Regular rate and rhythm; no murmurs rubs or gallops  CHEST: clear to auscultation bilaterally.  ABDOMEN: Positive Bowel Sounds, soft non-tender; .    Data Reviewed: Basic Metabolic Panel:  Recent Labs Lab 06/03/13 2108 06/04/13 0600  NA 131* 137  K 5.0 4.4  CL 97 102  CO2 21 24  GLUCOSE 160* 112*  BUN 117* 104*  CREATININE 2.50* 1.94*  CALCIUM 9.1 9.3   Liver Function Tests:  Recent Labs Lab 06/03/13 2108  AST 29  ALT 35  ALKPHOS 87  BILITOT 1.1  PROT 7.7  ALBUMIN 3.9   No results found for this basename: LIPASE, AMYLASE,  in the last 168 hours No results found for this basename: AMMONIA,  in the last  168 hours CBC:  Recent Labs Lab 06/03/13 2108  06/04/13 0600 06/04/13 0818 06/04/13 1616 06/04/13 2356 06/05/13 0821  WBC 5.7  --  4.3  --   --   --   --   NEUTROABS 4.1  --   --   --   --   --   --   HGB 13.0  < > 12.2* 12.1* 12.5* 12.3* 13.0  HCT 37.6*  < > 36.1* 35.3* 37.2* 35.4* 38.1*  MCV 87.4  --  87.6  --   --   --   --   PLT 103*  --  109*  --   --   --   --   < > = values in this interval not displayed. Cardiac Enzymes:  Recent Labs Lab 06/03/13 2108  TROPONINI <0.30   BNP (last 3 results)  Recent Labs  11/27/12 1405 11/29/12 0655 06/03/13 2108  PROBNP 1210.0* 1278.0* 1390.0*   CBG:  Recent Labs Lab 06/04/13 1137 06/04/13 1634 06/04/13 2106 06/05/13 0748  06/05/13 0911  GLUCAP 265* 213* 99 60* 114*    No results found for this or any previous visit (from the past 240 hour(s)).   Studies: Dg Chest 2 View  06/03/2013   CLINICAL DATA:  Chest pain  EXAM: CHEST  2 VIEW  COMPARISON:  11/27/2012  FINDINGS: Chronic cardiomegaly. Status post CABG. Cephalized blood flow and central vascular prominence. Chronic bronchitic changes. No edema or asymmetric opacity. No effusion or pneumothorax.  IMPRESSION: 1. Cardiomegaly and pulmonary venous congestion. 2. COPD.   Electronically Signed   By: Tiburcio Pea M.D.   On: 06/03/2013 22:40   US Renal  06/04/2013   CLINICAL DATA:  Hematuria.  EXAM: RENAL/URINARY TRACT ULTRASOUND COMPLETE  COMPARISON:  Abdominal ultrasound 11/04/2012. [name]  FINDINGS: Right Kidney  Length: 12 cm long axis. 4 mm nonobstructing upper pole renal collecting system calculus. No hydronephrosis. Otherwise the central sinus echo complex appears normal.  Left Kidney  Length: 13.5 cm long axis. Echogenicity within normal limits. No mass or hydronephrosis visualized.  Bladder  Appears normal for degree of bladder distention.  Small amount of ascites is incidentally noted.  IMPRESSION: 1. Negative for hydronephrosis. Nonobstructing right upper pole renal collecting system calculus measuring 4 mm. 2. Small volume ascites. 3. Mild enlargement of the left kidney may be related to body habitus.   Electronically Signed   By: Andreas Newport M.D.   On: 06/04/2013 07:12    Scheduled Meds: . amiodarone  200 mg Oral q morning - 10a  . atorvastatin  20 mg Oral QHS  . carvedilol  6.25 mg Oral BID WC  . cholecalciferol  1,000 Units Oral Daily  . docusate sodium  100 mg Oral BID  . insulin aspart  0-5 Units Subcutaneous QHS  . insulin aspart  0-9 Units Subcutaneous TID WC  . insulin detemir  100 Units Subcutaneous q morning - 10a  . isosorbide mononitrate  30 mg Oral QHS  . multivitamin with minerals  1 tablet Oral Daily  . sodium chloride  3 mL  Intravenous Q12H   Continuous Infusions: . sodium chloride 75 mL/hr at 06/04/13 0146    Principal Problem:   Acute renal failure Active Problems:   HYPERLIPIDEMIA   CORONARY ARTERY DISEASE   Atrial fibrillation   HTN (hypertension)   Type II or unspecified type diabetes mellitus with peripheral circulatory disorders, uncontrolled(250.72)   COPD (chronic obstructive pulmonary disease)   Chronic systolic heart failure  Hematuria   Generalized weakness    Time spent: 25 min    Janelys Glassner  [facility] Pager [phone]. If 7PM-7AM, please contact night-coverage at [email or web address], password [facility] 06/05/2013, 11:18 AM  LOS: 2 days

## 2013-06-05 NOTE — Progress Notes (Signed)
ANTICOAGULATION CONSULT NOTE - Initial Consult  Pharmacy Consult for Coumadin Indication: atrial fibrillation  No Known Allergies  Patient Measurements: Height: 5\' 9"  (175.3 cm) Weight: 154 lb 1.6 oz (69.899 kg) IBW/kg (Calculated) : 70.7 Heparin Dosing Weight:   Vital Signs: Temp: 97.8 F (36.6 C) (11/02 0946) Temp src: Oral (11/02 0946) BP: 96/48 mmHg (11/02 0946) Pulse Rate: 56 (11/02 0946)  Labs:  Recent Labs  06/03/13 2108  06/04/13 0600  06/04/13 1616 06/04/13 2356 06/05/13 0821  HGB 13.0  < > 12.2*  < > 12.5* 12.3* 13.0  HCT 37.6*  < > 36.1*  < > 37.2* 35.4* 38.1*  PLT 103*  --  109*  --   --   --   --   LABPROT 33.8*  --   --   --   --   --   --   INR 3.50*  --   --   --   --   --   --   CREATININE 2.50*  --  1.94*  --   --   --   --   TROPONINI <0.30  --   --   --   --   --   --   < > = values in this interval not displayed.  Estimated Creatinine Clearance: 34.5 ml/min (by C-G formula based on Cr of 1.94).   Medical History: Past Medical History  Diagnosis Date  . Atrial fibrillation      S/P ablation  . CAD (coronary artery disease)     S/P MI 1980's----------------------Dr McDowell  . Hyperlipidemia   . CHF (congestive heart failure)   . 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 BPH (benign prostatic hypertrophy)   . Stroke   . Sleep disturbance     Medications:  Scheduled:  . amiodarone  200 mg Oral q morning - 10a  . atorvastatin  20 mg Oral QHS  . carvedilol  6.25 mg Oral BID WC  . cholecalciferol  1,000 Units Oral Daily  . docusate sodium  100 mg Oral BID  . insulin aspart  0-5 Units Subcutaneous QHS  . insulin aspart  0-9 Units Subcutaneous TID WC  . insulin detemir  100 Units Subcutaneous q morning - 10a  . isosorbide mononitrate  30 mg Oral QHS  . multivitamin with minerals  1 tablet Oral Daily  . sodium chloride  3 mL Intravenous Q12H    Assessment: 72yo male with Afib, admitted 10/31 with  hematuria possibly related to Coumadin and elevated INR 3.5.  She has received no Coumadin this admission, and her last dose was 10/30 as she had been instructed to hold her Coumadin x 2 days by outpt MD due to hematuria.  There have been no INRs checked since admission.  Hg has been stable; pltc was low on admit at 103.  Goal of Therapy:  INR 2-3 Monitor platelets by anticoagulation protocol: Yes   Plan:  INR now and daily  Marisue Humble, PharmD Clinical Pharmacist [facility]

## 2013-06-05 NOTE — Progress Notes (Signed)
06/05/13  Pharmacy-  [facility]  INR = 2.12  A/P:  72yo male with AFib.  INR has fallen from admission value of 3.5 to 2.12 today.  His last Coumadin dose was 10/30.    1.  Coumadin 5mg  today 2.  F/U in AM  Marisue Humble, PharmD Clinical Pharmacist [facility]

## 2013-06-06 LAB — GLUCOSE, CAPILLARY
Glucose-Capillary: 87 mg/dL (ref 70–99)
Glucose-Capillary: 120 mg/dL — ABNORMAL HIGH (ref 70–99)
Glucose-Capillary: 167 mg/dL — ABNORMAL HIGH (ref 70–99)
Glucose-Capillary: 175 mg/dL — ABNORMAL HIGH (ref 70–99)

## 2013-06-06 LAB — URINE MICROSCOPIC-ADD ON

## 2013-06-06 LAB — URINALYSIS, ROUTINE W REFLEX MICROSCOPIC
Bilirubin Urine: NEGATIVE
Glucose, UA: NEGATIVE mg/dL
Ketones, ur: 15 mg/dL — AB
Nitrite: NEGATIVE
Protein, ur: 100 mg/dL — AB
Specific Gravity, Urine: 1.019 (ref 1.005–1.030)
Urobilinogen, UA: 0.2 mg/dL (ref 0.0–1.0)

## 2013-06-06 LAB — HEMOGLOBIN AND HEMATOCRIT, BLOOD: Hemoglobin: 11.8 g/dL — ABNORMAL LOW (ref 13.0–17.0)

## 2013-06-06 LAB — PROTIME-INR: Prothrombin Time: 22.6 seconds — ABNORMAL HIGH (ref 11.6–15.2)

## 2013-06-06 MED ORDER — WARFARIN SODIUM 5 MG PO TABS
5.0000 mg | ORAL_TABLET | Freq: Once | ORAL | Status: DC
  Filled 2013-06-06: qty 1

## 2013-06-06 MED ORDER — OXYCODONE HCL 5 MG PO TABS: 5.0000 mg | ORAL_TABLET | ORAL | Status: DC | PRN

## 2013-06-06 NOTE — Progress Notes (Addendum)
[facility] PROGRESS NOTE  [name] ZOX:[id number] DOB: 10/03/1940 DOA: 06/03/2013 PCP: Tillman Abide, MD Brief HPI: [name] is a 72 y.o. male who presented to the ED with complaints of Blood in his urine that has been for the past 3 days . He has also had SOB, and Weakness over the past week. He has Atrial fibrillation and is on long-term Coumadin Rx, and he called his PCP and had been advised to hold his coumadin for 2 days, and if worsening to go the the ED. He was evaluated in the ED and was observed to have tea colored Urine, and his INR was found to be 3.50. He also was found to have an elevated BUN/Cr compares to a Cr = 1.6 3 months ago.   Assessment/Plan: 1. Acute Renal Failure/ Dehydration- he was started on IV fluids and his creatinine improved. Holding  Hold Ace Inhibitor Rx and Diuretics, . Renal US ordered negative for hydronephrosis. Repeat labs in am.  2. Hematuria- Possibly due to Elevated PT/INR so Coumadin on Hold, however send Urine C+S, and Renal US ordered and negative for hydronephrosis. Called urology and recommended outpatient follow up, pt reports recurrent hematuria. Doing H&H and sending UA tosay. 3. CAD- stable, on Imdur, and Carvedilol. ASA on Hold due to hematuria.  4. COPD -Stable continue Albuterol Nebs PRN.  5. Chronic Systolic CHF- Monitor for signs of Overload. Continue Carvedilol, Demadex and Metolazone (diuretics on Hold). Had 2-D ECHO 11/2012  6. Atrial fibrillation- On Amiodarone, Coumadin on hold due to Hematuria.  7. HTN- On Carvedilol, Demadex, Metolazone,  8. DM2 on Insulin- Continue Levermir, and Add SSI coverage PRN,  HbA1C is 7.1. He is on 100 units of levamir and monitor for hypoglycemia.  He had an episode of hypoglycemia this am , his cbg is in 83's and he remains asymptomatic. His levamir was decreased to 75 units daily. Discussed with the patient. Also obtained nutrition consult for diet consult.  CBG (last 3)   Recent Labs  06/06/13 0805 06/06/13 1212 06/06/13 1634  GLUCAP 120* 151* 167*     9. SCDs for DVT prophylaxis.   10. Longterm use of Coumadin- Monitor Pt/INR, Coumadin on hold due to Hematuria.      Code Status: full code Family Communication: none at bedside Disposition Plan: pending.    Consultants:  none  Procedures:  US RENAL no hydronephrosis.   Antibiotics:  none  HPI/Subjective: No pain, no nausea or vomiting.   Objective: Filed Vitals:   06/06/13 1411  BP: 106/50  Pulse: 58  Temp: 98.2 F (36.8 C)  Resp: 16    Intake/Output Summary (Last 24 hours) at 06/06/13 1911 Last data filed at 06/06/13 1356  Gross per 24 hour  Intake    600 ml  Output   1151 ml  Net   -551 ml   Filed Weights   06/04/13 0031 06/04/13 2111 06/05/13 2150  Weight: 69.355 kg (152 lb 14.4 oz) 69.899 kg (154 lb 1.6 oz) 69.718 kg (153 lb 11.2 oz)    Exam:  Alert afebrile comfortable HEART: Regular rate and rhythm; no murmurs rubs or gallops  CHEST: clear to auscultation bilaterally.  ABDOMEN: Positive Bowel Sounds, soft non-tender; .    Data Reviewed: Basic Metabolic Panel:  Recent Labs Lab 06/03/13 2108 06/04/13 0600 06/05/13 1105  NA 131* 137 135  K 5.0 4.4 5.0  CL 97 102 101  CO2 21 24 25   GLUCOSE 160* 112* 128*  BUN 117*  104* 72*  CREATININE 2.50* 1.94* 1.67*  CALCIUM 9.1 9.3 8.8   Liver Function Tests:  Recent Labs Lab 06/03/13 2108  AST 29  ALT 35  ALKPHOS 87  BILITOT 1.1  PROT 7.7  ALBUMIN 3.9   No results found for this basename: LIPASE, AMYLASE,  in the last 168 hours No results found for this basename: AMMONIA,  in the last 168 hours CBC:  Recent Labs Lab 06/03/13 2108  06/04/13 0600 06/04/13 0818 06/04/13 1616 06/04/13 2356 06/05/13 0821 06/06/13 1600  WBC 5.7  --  4.3  --   --   --   --   --   NEUTROABS 4.1  --   --   --   --   --   --   --   HGB 13.0  < > 12.2* 12.1* 12.5* 12.3* 13.0 11.8*  HCT 37.6*  < > 36.1* 35.3* 37.2* 35.4* 38.1*  34.6*  MCV 87.4  --  87.6  --   --   --   --   --   PLT 103*  --  109*  --   --   --   --   --   < > = values in this interval not displayed. Cardiac Enzymes:  Recent Labs Lab 06/03/13 2108  TROPONINI <0.30   BNP (last 3 results)  Recent Labs  11/27/12 1405 11/29/12 0655 06/03/13 2108  PROBNP 1210.0* 1278.0* 1390.0*   CBG:  Recent Labs Lab 06/05/13 1640 06/05/13 2145 06/06/13 0805 06/06/13 1212 06/06/13 1634  GLUCAP 86 120* 120* 151* 167*    No results found for this or any previous visit (from the past 240 hour(s)).   Studies: No results found.  Scheduled Meds: . amiodarone  200 mg Oral q morning - 10a  . atorvastatin  20 mg Oral QHS  . carvedilol  6.25 mg Oral BID WC  . cholecalciferol  1,000 Units Oral Daily  . docusate sodium  100 mg Oral BID  . insulin aspart  0-5 Units Subcutaneous QHS  . insulin aspart  0-9 Units Subcutaneous TID WC  . insulin detemir  100 Units Subcutaneous q morning - 10a  . isosorbide mononitrate  30 mg Oral QHS  . multivitamin with minerals  1 tablet Oral Daily  . sodium chloride  3 mL Intravenous Q12H  . warfarin  5 mg Oral ONCE-1800  . Warfarin - Pharmacist Dosing Inpatient   Does not apply q1800   Continuous Infusions:    Principal Problem:   Acute renal failure Active Problems:   HYPERLIPIDEMIA   CORONARY ARTERY DISEASE   Atrial fibrillation   HTN (hypertension)   Type II or unspecified type diabetes mellitus with peripheral circulatory disorders, uncontrolled(250.72)   COPD (chronic obstructive pulmonary disease)   Chronic systolic heart failure   Hematuria   Generalized weakness    Time spent: 25 min    Arneisha Kincannon  [facility] Pager [phone]. If 7PM-7AM, please contact night-coverage at [email or web address], password [facility] 06/06/2013, 7:11 PM  LOS: 3 days

## 2013-06-06 NOTE — Progress Notes (Signed)
ANTICOAGULATION CONSULT NOTE - Initial Consult  Pharmacy Consult for Coumadin Indication: atrial fibrillation  No Known Allergies  Patient Measurements: Height: 5\' 9"  (175.3 cm) Weight: 153 lb 11.2 oz (69.718 kg) IBW/kg (Calculated) : 70.7 Heparin Dosing Weight:   Vital Signs: Temp: 97.8 F (36.6 C) (11/03 0509) Temp src: Oral (11/03 0509) BP: 100/40 mmHg (11/03 0509) Pulse Rate: 58 (11/03 0509)  Labs:  Recent Labs  06/03/13 2108  06/04/13 0600  06/04/13 1616 06/04/13 2356 06/05/13 0821 06/05/13 1105 06/06/13 0643  HGB 13.0  < > 12.2*  < > 12.5* 12.3* 13.0  --   --   HCT 37.6*  < > 36.1*  < > 37.2* 35.4* 38.1*  --   --   PLT 103*  --  109*  --   --   --   --   --   --   LABPROT 33.8*  --   --   --   --   --   --  23.1* 22.6*  INR 3.50*  --   --   --   --   --   --  2.12* 2.06*  CREATININE 2.50*  --  1.94*  --   --   --   --  1.67*  --   TROPONINI <0.30  --   --   --   --   --   --   --   --   < > = values in this interval not displayed.  Estimated Creatinine Clearance: 40 ml/min (by C-G formula based on Cr of 1.67).   Medical History: Past Medical History  Diagnosis Date  . Atrial fibrillation      S/P ablation  . CAD (coronary artery disease)     S/P MI 1980's----------------------Dr McDowell  . Hyperlipidemia   . CHF (congestive heart failure)   . 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 BPH (benign prostatic hypertrophy)   . Stroke   . Sleep disturbance     Assessment: 72yom on chronic Coumadin for Afib. INR (2.06) remains therapeutic after Coumadin restarted on 11/2. Coumadin was held on 10/31 and 11/1 for elevated INR and hematuria. Hematuria is clearing up per MD notes.  - No CBC this AM (Hg 13 on 11/2) - No significant bleeding reported  Goal of Therapy:  INR 2-3   Plan:  1. Repeat Coumadin 5mg  po x 1 today 2. Follow-up AM INR and CBC  Cleon Dew [phone] 06/06/2013,8:45 AM

## 2013-06-06 NOTE — Progress Notes (Signed)
Inpatient Diabetes Program Recommendations  AACE/ADA: New Consensus Statement on Inpatient Glycemic Control (2013)  Target Ranges:  Prepandial:   less than 140 mg/dL      Peak postprandial:   less than 180 mg/dL (1-2 hours)      Critically ill patients:  140 - 180 mg/dL   Reason for Visit: Results for NIKASH, MORTENSEN (MRN [id number]) as of 06/06/2013 11:12  Ref. Range 06/05/2013 09:11 06/05/2013 11:50 06/05/2013 16:40 06/05/2013 21:45 06/06/2013 08:05  Glucose-Capillary Latest Range: 70-99 mg/dL 409 (H) 811 (H) 86 914 (H) 120 (H)   Please decrease Levemir to 90 units daily.    Thanks, Beryl Meager, RN, BC-ADM Inpatient Diabetes Coordinator Pager [phone]

## 2013-06-06 NOTE — Care Management Note (Signed)
   CARE MANAGEMENT NOTE 06/06/2013  Patient:  JACQUELINE, DELAPENA   Account Number:  [id number]  Date Initiated:  06/06/2013  Documentation initiated by:  Samhita Kretsch  Subjective/Objective Assessment:   order for CHF program     Action/Plan:   Met with pt who selected [facility] for [facility] CHF program. Pt will d/c to sisters home in [place]. [facility] notifed and provided with pt cell phone number, pt alert and able to make arrangements.   Anticipated DC Date:  06/06/2013   Anticipated DC Plan:  HOME W HOME HEALTH SERVICES         Choice offered to / List presented to:          [facility] arranged  HH-1 RN  HH-10 DISEASE MANAGEMENT      HH agency  [facility]   Status of service:  Completed, signed off Medicare Important Message given?   (If response is "NO", the following Medicare IM given date fields will be blank) Date Medicare IM given:   Date Additional Medicare IM given:    Discharge Disposition:  HOME W HOME HEALTH SERVICES  Per UR Regulation:    If discussed at Long Length of Stay Meetings, dates discussed:    Comments:

## 2013-06-07 LAB — GLUCOSE, CAPILLARY
Glucose-Capillary: 64 mg/dL — ABNORMAL LOW (ref 70–99)
Glucose-Capillary: 64 mg/dL — ABNORMAL LOW (ref 70–99)
Glucose-Capillary: 149 mg/dL — ABNORMAL HIGH (ref 70–99)
Glucose-Capillary: 163 mg/dL — ABNORMAL HIGH (ref 70–99)

## 2013-06-07 LAB — BASIC METABOLIC PANEL
CO2: 26 mEq/L (ref 19–32)
Calcium: 8.8 mg/dL (ref 8.4–10.5)
GFR calc non Af Amer: 47 mL/min — ABNORMAL LOW (ref >90)
Glucose, Bld: 112 mg/dL — ABNORMAL HIGH (ref 70–99)
Potassium: 4.5 mEq/L (ref 3.5–5.1)
Sodium: 136 mEq/L (ref 135–145)

## 2013-06-07 LAB — CBC
Hemoglobin: 12.2 g/dL — ABNORMAL LOW (ref 13.0–17.0)
MCH: 29.7 pg (ref 26.0–34.0)
MCHC: 33.2 g/dL (ref 30.0–36.0)
MCV: 89.5 fL (ref 78.0–100.0)
Platelets: 112 10*3/uL — ABNORMAL LOW (ref 150–400)
RBC: 4.11 MIL/uL — ABNORMAL LOW (ref 4.22–5.81)

## 2013-06-07 LAB — PROTIME-INR: Prothrombin Time: 22.2 seconds — ABNORMAL HIGH (ref 11.6–15.2)

## 2013-06-07 MED ORDER — SODIUM CHLORIDE 0.9 % IV SOLN
INTRAVENOUS | Status: DC
  Administered 2013-06-07: 17:00:00 via INTRAVENOUS

## 2013-06-07 MED ORDER — INSULIN DETEMIR 100 UNIT/ML ~~LOC~~ SOLN
75.0000 [IU] | Freq: Every morning | SUBCUTANEOUS | Status: DC
  Administered 2013-06-07 – 2013-06-08 (×2): 75 [IU] via SUBCUTANEOUS
  Filled 2013-06-07 (×2): qty 0.75

## 2013-06-07 NOTE — Evaluation (Addendum)
Physical Therapy Evaluation Patient Details Name: ROMANI WILBON MRN: [id number] DOB: 1941-07-13 Today's Date: 06/07/2013 Time:  -     PT Assessment / Plan / Recommendation History of Present Illness  Muad Noga Coalson is a 72 y.o. male who presented to the ED with complaints of Blood in his urine that has been for the past 3 days . He has also had SOB, and Weakness over the past week. He has Atrial fibrillation and is on long-term Coumadin Rx, and he called his PCP and had been advised to hold his coumadin for 2 days, and if worsening to go the the ED. He was evaluated in the ED and was observed to have tea colored Urine, and his INR was found to be 3.50. He also was found to have an elevated BUN/Cr compares to a Cr = 1.6 3 months ago.     Clinical Impression  Patients function has decreased during his hospital stay.  Patient has difficulty ambulating community distances and will benefit from using his SPC vs. RW in the community.  D/c is complicated by the fact that he lives alone in an old house with no heat or air condition and has to care for his sister during the day at her living community.  Social worker was consulted to evaluate his eligibility for assistance (meals on wheels, housing, etc.).  Pt may need SNF to improve overall strength, balance and functional mobility prior to d/c home alone.  Pt did adamantly refuse any assistance including SW and therapy in his home due to the overall condition of the house.  Pt stated "I'm embarrassed, to be honest of my home."     PT Assessment  Patient needs continued PT services    Follow Up Recommendations  SNF; Supervision/Assistance - 24 hour       Barriers to Discharge  Home in poor condition and having to care for his sister.      Equipment Recommendations  Rolling walker with 5" wheels    Recommendations for Other Services Other (comment) (Social worker to assess eligibility for assistance)   Frequency Min 4X/week    Precautions /  Restrictions Restrictions Weight Bearing Restrictions: No         Mobility  Bed Mobility Bed Mobility:  (seated EOB upon entry to the room) Transfers Transfers: Sit to Stand Sit to Stand: 5: Supervision Ambulation/Gait Ambulation/Gait Assistance: 4: Min guard, min (A) (gait belt support, HHA, and using railing sporadically) Ambulation Distance (Feet): 50 Feet (with 2 standing rest breaks) Gait Pattern: Step-through pattern Gait velocity:  (diminished) General Gait Details: fatigues easily Stairs: No    Exercises     PT Diagnosis: Difficulty walking;Abnormality of gait;Generalized weakness  PT Problem List: Decreased strength;Decreased activity tolerance;Decreased coordination;Decreased knowledge of use of DME;Decreased safety awareness PT Treatment Interventions: DME instruction;Gait training;Stair training;Functional mobility training;Therapeutic activities;Therapeutic exercise;Balance training     PT Goals(Current goals can be found in the care plan section) Acute Rehab PT Goals Patient Stated Goal: return home PT Goal Formulation: With patient Time For Goal Achievement: 06/21/13 Potential to Achieve Goals: Good  Visit Information  Last PT Received On: 06/07/13 History of Present Illness: Patsy Varma Crossett is a 72 y.o. male who presented to the ED with complaints of Blood in his urine that has been for the past 3 days . He has also had SOB, and Weakness over the past week. He has Atrial fibrillation and is on long-term Coumadin Rx, and he called his PCP and had  been advised to hold his coumadin for 2 days, and if worsening to go the the ED. He was evaluated in the ED and was observed to have tea colored Urine, and his INR was found to be 3.50. He also was found to have an elevated BUN/Cr compares to a Cr = 1.6 3 months ago.         Prior Functioning  Home Living Family/patient expects to be discharged to:: Private residence Living Arrangements: Alone (helps take care of  sister with alzheimer's during the day) Available Help at Discharge: None Type of Home: House Home Access: Stairs to enter (5) Entrance Stairs-Number of Steps: 5 Home Layout: One level Home Equipment: Cane - single point (as needed to steady himself), has a shower chair. Prior Function Level of Independence: Independent Comments: still driving, using a scooter around grocery store Communication Communication: No difficulties    Cognition  Cognition Arousal/Alertness: Awake/alert Behavior During Therapy: WFL for tasks assessed/performed Overall Cognitive Status: Within Functional Limits for tasks assessed    Extremity/Trunk Assessment     Balance Balance Balance Assessed: Yes Static Sitting Balance Static Sitting - Balance Support: Bilateral upper extremity supported;Feet supported Static Sitting - Level of Assistance: 5: Stand by assistance Static Sitting - Comment/# of Minutes: 5  End of Session PT - End of Session Equipment Utilized During Treatment: Gait belt Activity Tolerance: Patient limited by fatigue;Patient limited by lethargy Patient left: in chair  GP     Barrie Dunker 06/07/2013, 12:44 PM  Solmon Ice, SPT  [place], PT DPT [phone]

## 2013-06-07 NOTE — Telephone Encounter (Signed)
Pt was admitted to [facility]

## 2013-06-07 NOTE — Progress Notes (Signed)
ANTICOAGULATION CONSULT NOTE - Follow Up Consult  Pharmacy Consult for Coumadin Indication: atrial fibrillation  No Known Allergies  Patient Measurements: Height: 5\' 9"  (175.3 cm) Weight: 153 lb 11.2 oz (69.718 kg) IBW/kg (Calculated) : 70.7 Heparin Dosing Weight:   Vital Signs: Temp: 97.6 F (36.4 C) (11/04 0451) Temp src: Oral (11/04 0451) BP: 107/58 mmHg (11/04 0451) Pulse Rate: 56 (11/04 0451)  Labs:  Recent Labs  06/05/13 0821 06/05/13 1105 06/06/13 0643 06/06/13 1600 06/07/13 0520  HGB 13.0  --   --  11.8* 12.2*  HCT 38.1*  --   --  34.6* 36.8*  PLT  --   --   --   --  112*  LABPROT  --  23.1* 22.6*  --  22.2*  INR  --  2.12* 2.06*  --  2.02*  CREATININE  --  1.67*  --   --  1.45*    Estimated Creatinine Clearance: 46.1 ml/min (by C-G formula based on Cr of 1.45).  Assessment: 72yom on chronic Coumadin for Afib. INR (2.02) remains therapeutic. However, Coumadin was held per MD order on 11/3 due to recurrent hematuria. Discussed with Dr. Blake Divine - continue to hold Coumadin. - H/H improving, Plts low but improving  Goal of Therapy:  INR 2-3   Plan:  1. No Coumadin tonight per MD order 2. Follow-up AM INR and Coumadin restart plans  Cleon Dew [phone] 06/07/2013,9:51 AM

## 2013-06-07 NOTE — Plan of Care (Signed)
Problem: Food- and Nutrition-Related Knowledge Deficit (NB-1.1) Goal: Nutrition education Formal process to instruct or train a patient/client in a skill or to impart knowledge to help patients/clients voluntarily manage or modify food choices and eating behavior to maintain or improve health. Outcome: Completed/Met Date Met:  06/07/13  RD consulted for nutrition education regarding diabetes. Pt asking for 5 Day Meal Plan. RD provided pt with handouts per his request. Pt very appreciative of information.    Lab Results  Component Value Date    HGBA1C 7.1* 06/04/2013    RD provided "Carbohydrate Counting for People with Diabetes" handout from the Academy of Nutrition and Dietetics. Discussed different food groups and their effects on blood sugar, emphasizing carbohydrate-containing foods. Provided list of carbohydrates and recommended serving sizes of common foods.  Discussed importance of controlled and consistent carbohydrate intake throughout the day. Provided examples of ways to balance meals/snacks and encouraged intake of high-fiber, whole grain complex carbohydrates. Teach back method used.  Expect fair compliance.  Body mass index is 22.69 kg/(m^2). Pt meets criteria for normal weight based on current BMI.  Current diet order is Carbohydrate Modified Medium, patient is consuming approximately 100% of meals at this time. Labs and medications reviewed. No further nutrition interventions warranted at this time. RD contact information provided. If additional nutrition issues arise, please re-consult RD.  Jarold Motto MS, RD, LDN Pager: [phone] After-hours pager: [phone]

## 2013-06-08 ENCOUNTER — Telehealth: Payer: Self-pay | Admitting: Internal Medicine

## 2013-06-08 LAB — GLUCOSE, CAPILLARY
Glucose-Capillary: 121 mg/dL — ABNORMAL HIGH (ref 70–99)
Glucose-Capillary: 133 mg/dL — ABNORMAL HIGH (ref 70–99)
Glucose-Capillary: 200 mg/dL — ABNORMAL HIGH (ref 70–99)
Glucose-Capillary: 222 mg/dL — ABNORMAL HIGH (ref 70–99)

## 2013-06-08 LAB — CBC
Platelets: 119 10*3/uL — ABNORMAL LOW (ref 150–400)
RBC: 3.75 MIL/uL — ABNORMAL LOW (ref 4.22–5.81)
RDW: 16.9 % — ABNORMAL HIGH (ref 11.5–15.5)
WBC: 5.2 10*3/uL (ref 4.0–10.5)

## 2013-06-08 MED ORDER — OXYCODONE HCL 5 MG PO TABS: 5.0000 mg | ORAL_TABLET | Freq: Four times a day (QID) | ORAL | Status: DC | PRN

## 2013-06-08 MED ORDER — INSULIN DETEMIR 100 UNIT/ML ~~LOC~~ SOLN: 90.0000 [IU] | Freq: Every day | SUBCUTANEOUS | Status: DC

## 2013-06-08 NOTE — Discharge Summary (Signed)
Physician Discharge Summary  [name] EXB:[id number] DOB: 10/19/1940 DOA: 06/03/2013  PCP: Tillman Abide, MD  Admit date: 06/03/2013 Discharge date: 06/08/2013  Time spent: 40  minutes  Recommendations for Outpatient Follow-up:  1. D/c home with HHPT 2. Has follow up with PCP in 1 week on 11/13. Ned to repeat H&H. If stable and no further hematuria plan to resume ASA and coumadin. 3. Has appt with urologist today at 2:30 pm 4. Follow up with cardiologist in 2 weeks  Discharge Diagnoses:  Principal Problem:   Acute renal failure  Active Problems:   Hematuria   Atrial fibrillation   HTN (hypertension)   COPD (chronic obstructive pulmonary disease)   Chronic systolic heart failure   Generalized weakness   Type II or unspecified type diabetes mellitus with peripheral circulatory disorders, uncontrolled(250.72) Coronary artery diesase  Discharge Condition: *fair  Diet recommendation: diabetic  Filed Weights   06/05/13 2150 06/06/13 2033 06/07/13 2034  Weight: 69.718 kg (153 lb 11.2 oz) 69.718 kg (153 lb 11.2 oz) 69.718 kg (153 lb 11.2 oz)    History of present illness:  Please refer to admission H&P for details but in brief, 72 year old male with history of severe systolic CHF (EF of 25-30%), A. fib on long-term anticoagulation with Coumadin, hypertension, hyperlipidemia, coronary artery disease, COPD, type 2 diabetes mellitus on Levemir was sent to the ED by his PCP after having symptoms of hematuria. In the ED his INR was 2.5 and was also found to have acute and chronic kidney injury.  Hospital Course:  Hematuria Possible in the setting of anticoagulation use although the INR was not that high. Renal US showed a small nonobstrcutive right kidney stone. He has had a slight drop in his H&H but is hemodynamically stable. He still has off on mild hematuria. Stool for occult blood was negative. An appointment with outpatient urology has been made today ( Dr Margarita Grizzle). His  aspirin and Coumadin had been on hold and he will be discharged home without them for now. I have spoken with his PCP today. He  has an appointment with him in next week and if hemoglobin remains stable and has no further hematuria his aspirin and Coumadin will be resumed. Patient instructed to return to the ED if he has persistent hematuria along with symptoms such as dizziness, weakness, SOB or chest discomfort.  -INR today is 1.75  Acute on chronic kidney disease Possibly in the setting of dehydration. -His diuretis and  ACE inhibitor were held. His creatinine has improved to 1.4 today. I will resume he is Demadex but continue to hold metolazone and quinapril until he is seen by his PCP next week.  Severe systolic CHF Last EF of 25-30%. Continue Coreg and imdur, resume Demadex. We continued to hold quinapril and metolazone until he is seen by his PCP. Hold aspirin in the setting of active hematuria . patient has a followup with his cardiologist Dr. Lewie Loron on 11/21.  Afib'  rate controlled and in sinus rhythm. Off coumadin and ASA for now. Continue amiodarone and coreg.  Type 2 diabetes mellitus  hemoglobin A1c 7.1. He is blood glucose had been in the low 100s and we have reduced his Levemir goes below 90 units daily at bedtime.  COPD Currently stable  CAD Stable. Continue statin , coreg and imdur  Procedures:  None  Consultations:  None  Discharge Exam: Filed Vitals:   06/08/13 0956  BP: 98/50  Pulse: 60  Temp: 97.8 F (36.6 C)  Resp: 18    General: Elderly male in no acute distress HEENT: No pallor, moist oral mucosa Chest: Clear to auscultation bilaterally, no added sounds CVS: Normal S1 and S2, no murmurs rub or gallop Abdomen: Soft, nontender, nondistended, bowel sounds present Extremities: Warm, no edema CNS: AAO x3   Discharge Instructions  Discharge Orders   Future Appointments Provider Department Dept Phone   06/16/2013 3:45 PM Karie Schwalbe, MD  [facility] [place] [phone]   06/16/2013 4:00 PM [facility] [place] [phone]   06/24/2013 3:00 PM Antonieta Iba, MD [facility] [phone]   Future Orders Complete By Expires   Diet - low sodium heart healthy  As directed    Discharge instructions  As directed    Comments:     Follow up with PCP ino ne week Follow upwith Dr Margarita Grizzle on Wednesday at 2 pm,       Medication List    STOP taking these medications       aspirin EC 81 MG tablet     metolazone 2.5 MG tablet  Commonly known as:  ZAROXOLYN     quinapril 10 MG tablet  Commonly known as:  ACCUPRIL     warfarin 5 MG tablet  Commonly known as:  COUMADIN      TAKE these medications       albuterol (5 MG/ML) 0.5% nebulizer solution  Commonly known as:  PROVENTIL  Take 0.5 mLs (2.5 mg total) by nebulization every 6 (six) hours as needed for wheezing or shortness of breath.     amiodarone 200 MG tablet  Commonly known as:  PACERONE  Take 200 mg by mouth every morning.     atorvastatin 20 MG tablet  Commonly known as:  LIPITOR  Take 20 mg by mouth at bedtime.     carvedilol 6.25 MG tablet  Commonly known as:  COREG  Take 1 tablet (6.25 mg total) by mouth 2 (two) times daily with a meal.     insulin detemir 100 UNIT/ML injection  Commonly known as:  LEVEMIR  Inject 90 Units into the skin daily at bedtime     isosorbide mononitrate 30 MG 24 hr tablet  Commonly known as:  IMDUR  Take 30 mg by mouth at bedtime.     LORazepam 0.5 MG tablet  Commonly known as:  ATIVAN  Take 0.5-1 mg by mouth at bedtime as needed (for sleep).     multivitamin with minerals Tabs tablet  Take 1 tablet by mouth daily.     oxyCODONE 5 MG immediate release tablet  Commonly known as:  Oxy IR/ROXICODONE  Take 1 tablet (5 mg total) by mouth every 4 (four) hours as needed.     STOOL SOFTENER PO  Take 1 capsule by mouth 2 (two) times daily.      torsemide 20 MG tablet  Commonly known as:  DEMADEX  Take 2 tablets (40 mg total) by mouth 2 (two) times daily. Hold afternoon dose if your weight at home is under 160#     Vitamin D 1000 UNITS capsule  Take 1,000 Units by mouth every morning.       No Known Allergies     Follow-up Information   Follow up with Tillman Abide, MD. Schedule an appointment as soon as possible for a visit in 1 week.   Specialties:  Internal Medicine, Pediatrics   Contact information:   [address] [place]  [address] [phone]       Follow up with Milford Cage, MD On 06/08/2013. (at 2 pm . )    Specialty:  Urology   Contact information:   [address] [place] FLOOR [address] [place] [address] [phone]       Follow up with Tillman Abide, MD In 1 week. (has appt on 11/13)    Specialties:  Internal Medicine, Pediatrics   Contact information:   [address] [place] [address] [phone]       Follow up with Julien Nordmann, MD. Schedule an appointment as soon as possible for a visit in 2 weeks.   Specialty:  Cardiology   Contact information:   [facility] [place] [address] [place] [address] [phone]        The results of significant diagnostics from this hospitalization (including imaging, microbiology, ancillary and laboratory) are listed below for reference.    Significant Diagnostic Studies: Dg Chest 2 View  06/03/2013   CLINICAL DATA:  Chest pain  EXAM: CHEST  2 VIEW  COMPARISON:  11/27/2012  FINDINGS: Chronic cardiomegaly. Status post CABG. Cephalized blood flow and central vascular prominence. Chronic bronchitic changes. No edema or asymmetric opacity. No effusion or pneumothorax.  IMPRESSION: 1. Cardiomegaly and pulmonary venous congestion. 2. COPD.   Electronically Signed   By: Tiburcio Pea M.D.   On: 06/03/2013 22:40   US Renal  06/04/2013   CLINICAL DATA:  Hematuria.  EXAM:  RENAL/URINARY TRACT ULTRASOUND COMPLETE  COMPARISON:  Abdominal ultrasound 11/04/2012. Marland Kitchen  FINDINGS: Right Kidney  Length: 12 cm long axis. 4 mm nonobstructing upper pole renal collecting system calculus. No hydronephrosis. Otherwise the central sinus echo complex appears normal.  Left Kidney  Length: 13.5 cm long axis. Echogenicity within normal limits. No mass or hydronephrosis visualized.  Bladder  Appears normal for degree of bladder distention.  Small amount of ascites is incidentally noted.  IMPRESSION: 1. Negative for hydronephrosis. Nonobstructing right upper pole renal collecting system calculus measuring 4 mm. 2. Small volume ascites. 3. Mild enlargement of the left kidney may be related to body habitus.   Electronically Signed   By: Andreas Newport M.D.   On: 06/04/2013 07:12    Microbiology: No results found for this or any previous visit (from the past 240 hour(s)).   Labs: Basic Metabolic Panel:  Recent Labs Lab 06/03/13 2108 06/04/13 0600 06/05/13 1105 06/07/13 0520  NA 131* 137 135 136  K 5.0 4.4 5.0 4.5  CL 97 102 101 102  CO2 21 24 25 26   GLUCOSE 160* 112* 128* 112*  BUN 117* 104* 72* 54*  CREATININE 2.50* 1.94* 1.67* 1.45*  CALCIUM 9.1 9.3 8.8 8.8   Liver Function Tests:  Recent Labs Lab 06/03/13 2108  AST 29  ALT 35  ALKPHOS 87  BILITOT 1.1  PROT 7.7  ALBUMIN 3.9   No results found for this basename: LIPASE, AMYLASE,  in the last 168 hours No results found for this basename: AMMONIA,  in the last 168 hours CBC:  Recent Labs Lab 06/03/13 2108  06/04/13 0600  06/04/13 2356 06/05/13 0821 06/06/13 1600 06/07/13 0520 06/08/13 0930  WBC 5.7  --  4.3  --   --   --   --  4.9 5.2  NEUTROABS 4.1  --   --   --   --   --   --   --   --   HGB 13.0  < >  12.2*  < > 12.3* 13.0 11.8* 12.2* 11.3*  HCT 37.6*  < > 36.1*  < > 35.4* 38.1* 34.6* 36.8* 33.8*  MCV 87.4  --  87.6  --   --   --   --  89.5 90.1  PLT 103*  --  109*  --   --   --   --  112* 119*  < > =  values in this interval not displayed. Cardiac Enzymes:  Recent Labs Lab 06/03/13 2108  TROPONINI <0.30   BNP: BNP (last 3 results)  Recent Labs  11/27/12 1405 11/29/12 0655 06/03/13 2108  PROBNP 1210.0* 1278.0* 1390.0*   CBG:  Recent Labs Lab 06/07/13 1633 06/07/13 2032 06/08/13 0028 06/08/13 0734 06/08/13 0736  GLUCAP 212* 163* 222* 133* 121*       Signed:  Alanny Rivers  [facility] 06/08/2013, 11:53 AM

## 2013-06-08 NOTE — Progress Notes (Signed)
ANTICOAGULATION CONSULT NOTE - Follow Up Consult  Pharmacy Consult for Coumadin Indication: atrial fibrillation  No Known Allergies  Patient Measurements: Height: 5\' 9"  (175.3 cm) Weight: 153 lb 11.2 oz (69.718 kg) IBW/kg (Calculated) : 70.7 Heparin Dosing Weight:   Vital Signs: Temp: 97.8 F (36.6 C) (11/05 0956) Temp src: Oral (11/05 0956) BP: 98/50 mmHg (11/05 0956) Pulse Rate: 60 (11/05 0956)  Labs:  Recent Labs  06/05/13 1105 06/06/13 0643  06/06/13 1600 06/07/13 0520 06/08/13 0620 06/08/13 0930  HGB  --   --   < > 11.8* 12.2*  --  11.3*  HCT  --   --   --  34.6* 36.8*  --  33.8*  PLT  --   --   --   --  112*  --  119*  LABPROT 23.1* 22.6*  --   --  22.2* 19.9*  --   INR 2.12* 2.06*  --   --  2.02* 1.75*  --   CREATININE 1.67*  --   --   --  1.45*  --   --   < > = values in this interval not displayed.  Estimated Creatinine Clearance: 46.1 ml/min (by C-G formula based on Cr of 1.45).  Assessment: 72yom on chronic Coumadin for Afib. INR (1.75) is now subtherapeutic after holding the last 2 doses of Coumadin per MD orders for recurrent hematuria. Per discussion with MD, continue to hold Coumadin as patient is following up with outpatient Urologist today for further recommendations. - H/H trended down, Plts improving  Goal of Therapy:  INR 2-3 Monitor platelets by anticoagulation protocol: Yes   Plan:  1. No Coumadin today per MD orders 2. Follow-up AM INR and Coumadin plans  Cleon Dew [phone] 06/08/2013,10:29 AM

## 2013-06-08 NOTE — Progress Notes (Signed)
Patient was discharged home by car. Patient was able to drive home, per MD. Patient was given discharge instructions and prescriptions. Patient was told to go straight to urology appointment for 1400 appointment. Patient was told to contact MD with questions and concerns. Patient was stable upon discharge.

## 2013-06-08 NOTE — Telephone Encounter (Signed)
Phone call with Dr Dawna Part  Ready to discharge him Still with slight hematuria--- intermittent Going to see urologist in office today---?getting cysto  Holding coumadin and aspirin for now Asked him to restart his lasix at lower dose to prevent fluid accumulation (did have some AKI)  Carlena Sax, Please check on him later this week If the hematuria has stopped, get him back on the coumadin at a lower dose Had appt with me on 11/13 already

## 2013-06-09 ENCOUNTER — Telehealth: Payer: Self-pay

## 2013-06-09 ENCOUNTER — Other Ambulatory Visit: Payer: Self-pay

## 2013-06-09 MED ORDER — CARVEDILOL 6.25 MG PO TABS: 6.2500 mg | ORAL_TABLET | Freq: Two times a day (BID) | ORAL | Status: DC

## 2013-06-09 NOTE — Telephone Encounter (Signed)
Appt scheduled with pt.  Pt unable to come until Monday r/t other previously scheduled appts.

## 2013-06-09 NOTE — Telephone Encounter (Signed)
Refill sent for carvedilol 6.25 mg take one tablet twice a day.  

## 2013-06-09 NOTE — Telephone Encounter (Signed)
I checked the chart and apparently they increased him from 20-90 daily. I am not sure if that is too much for him  Find out how much he is taking and how his sugars have been

## 2013-06-09 NOTE — Telephone Encounter (Signed)
The patient called and is hoping to get clarification on how much insulin he should be taking.  He states the hospital put in him 100 units, but he is under the impression this was not the correct amount for him.  Thanks!

## 2013-06-10 ENCOUNTER — Encounter: Payer: Self-pay | Admitting: Cardiovascular Disease

## 2013-06-10 ENCOUNTER — Ambulatory Visit (INDEPENDENT_AMBULATORY_CARE_PROVIDER_SITE_OTHER): Payer: Medicare Other | Admitting: Cardiovascular Disease

## 2013-06-10 VITALS — BP 108/60 | HR 60 | Ht 69.0 in | Wt 170.5 lb

## 2013-06-10 DIAGNOSIS — I10 Essential (primary) hypertension: Secondary | ICD-10-CM

## 2013-06-10 DIAGNOSIS — I2510 Atherosclerotic heart disease of native coronary artery without angina pectoris: Secondary | ICD-10-CM

## 2013-06-10 DIAGNOSIS — N179 Acute kidney failure, unspecified: Secondary | ICD-10-CM

## 2013-06-10 DIAGNOSIS — R319 Hematuria, unspecified: Secondary | ICD-10-CM

## 2013-06-10 DIAGNOSIS — I5022 Chronic systolic (congestive) heart failure: Secondary | ICD-10-CM

## 2013-06-10 DIAGNOSIS — R079 Chest pain, unspecified: Secondary | ICD-10-CM

## 2013-06-10 DIAGNOSIS — E785 Hyperlipidemia, unspecified: Secondary | ICD-10-CM

## 2013-06-10 NOTE — Patient Instructions (Addendum)
You are doing well. No medication changes were made.  Please take  torsemide 40 mg daily For weight greater than 171 pounds, take torsemide 40 mg twice a day  Please call us if you have new issues that need to be addressed before your next appt.  Your physician wants you to follow-up in: 3 months.  You will receive a reminder letter in the mail two months in advance. If you don't receive a letter, please call our office to schedule the follow-up appointment.

## 2013-06-10 NOTE — Assessment & Plan Note (Signed)
Cholesterol is at goal on the current lipid regimen. No changes to the medications were made.  

## 2013-06-10 NOTE — Assessment & Plan Note (Signed)
Recent acute renal failure improved by holding diuresis. Challenging to walk a fine line given his chronic systolic CHF and pulmonary hypertension

## 2013-06-10 NOTE — Assessment & Plan Note (Signed)
He reports hematuria has improved. He has followup with [facility]

## 2013-06-10 NOTE — Assessment & Plan Note (Signed)
Blood pressure is well controlled on today's visit. No changes made to the medications. 

## 2013-06-10 NOTE — Assessment & Plan Note (Signed)
Mildly dehydrated on recent admission to the hospital. We have suggested he only take torsemide twice a day for weight over 170 pounds (his scale at home). This would be approximately 173 pounds in our office. Suggested he liberalize his fluid intake mildly. It sounds that he has been very strict with his fluid intake

## 2013-06-10 NOTE — Progress Notes (Signed)
Patient ID: Elward Nocera Bula, male    DOB: 1940-12-25, 72 y.o.   MRN: [id number]  HPI Comments: Mr. Arian Jergens is a pleasant 72 year old gentleman with past medical history of diabetes, coronary artery disease, bypass 1996, ischemic cardiomyopathy with ejection fraction 25-30% in September 2013, moderate to severe pulmonary hypertension at that time,  Episodes of paroxysmal atrial fibrillation with history of ablation, hyperlipidemia, obstructive sleep apnea, hypertension and COPD who presents for routine followup. 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 Inferior wall defect was seen on stress test in 2011.  Hx of nightmares which wake him up.   On a clinic visit in 2013 his weight was  180 pounds. at  that time  he had significant edema and abdominal swelling with shortness of breath.  he was started on torsemide with improvement of his weight , initially down to 170 pounds, and into the 160 range .   In followup visits, his weight was up and torsemide was increased from 40 mg daily up to 40 mg twice a day. He was having significant abdominal swelling, leg edema, shortness of breath. He uses metolazone sparingly  On a prior visit, he was not eating as much, had thinning in his face from weight loss. continued to have mild edema, shortness of breath with exertion.   bothered by his stomach which was somewhat distended and tight.   In followup today, he had a recent hospital admission for hematuria. INR was 3.5. Creatinine was 2.5, BNP 1300. Hemoglobin A1c 7.1.  He reports that he has not been drinking very much at home. During the hospital course, INR improved down to 2.1, creatinine down to 1.67 with holding diuresis. Hematuria stopped. He has followup with [facility]. He reports having a CT scan done of his bladder.  Since the discharge, he has been off his insulin as he reports he was told to increase his insulin dosing significantly. He feels it was  inappropriate. We had home has been approximately 168 pounds. He feels his scale at home is 3 pounds less than our office. He is concerned as weight is up slightly. Review of records show weight is approximately the same as his prior clinic visit. He denies any significant shortness of breath on today's visit. 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 albuterol (PROVENTIL) (5 MG/ML) 0.5% nebulizer solution Take 0.5 mLs (2.5 mg total) by nebulization every 6 (six) hours as needed for wheezing or shortness of breath.  150 mL  1  . amiodarone (PACERONE) 200 MG tablet Take 1 tablet (200 mg total) by mouth daily.  30 tablet  3  . aspirin EC 81 MG tablet Take 81 mg by mouth daily.      Marland Kitchen atorvastatin (LIPITOR) 20 MG tablet Take 20 mg by mouth at bedtime.       . carvedilol (COREG) 6.25 MG tablet Take 1 tablet (6.25 mg total) by mouth 2 (two) times daily with a meal.  60 tablet  5  . cholecalciferol (VITAMIN D) 1000 UNITS tablet Take 1,000 Units by mouth daily.      Tery Sanfilippo Calcium (STOOL SOFTENER PO) Take 1 capsule by mouth 2 (two) times daily.      Marland Kitchen  glucose blood (RELION PRIME TEST) test strip Test blood sugar 3-5 times daily, dx: 250.00  200 each  11  . insulin detemir (LEVEMIR) 100 UNIT/ML injection Inject 0.2 mLs (20 Units total) into the skin daily before breakfast.  20 mL  1  . Insulin Syringe-Needle U-100 (BD INSULIN SYRINGE ULTRAFINE) 31G X 5/16" 1 ML MISC USE AS DIRECTED to test blood sugar 1-2 times daily dx: 250.72  200 each  6  . isosorbide mononitrate (IMDUR) 30 MG 24 hr tablet Take 30 mg by mouth at bedtime.      Marland Kitchen LORazepam (ATIVAN) 0.5 MG tablet Take 0.5-1 mg by mouth at bedtime as needed (for  sleep).       . metolazone (ZAROXOLYN) 2.5 MG tablet Take 1 tablet (2.5 mg total) by mouth daily.  30 tablet  0  . Multiple Vitamin (MULTIVITAMIN WITH MINERALS) TABS Take 1 tablet by mouth daily.      . Omega-3 Fatty Acids (FISH OIL) 1000 MG CAPS Take 1,000 mg by mouth daily.      . quinapril (ACCUPRIL) 10 MG tablet Take 10 mg by mouth at bedtime.      . sitaGLIPtin (JANUVIA) 100 MG tablet Take 1 tablet (100 mg total) by mouth daily.  90 tablet  1  . spironolactone (ALDACTONE) 100 MG tablet Take 1 tablet (100 mg total) by mouth daily.  30 tablet  11  . torsemide (DEMADEX) 20 MG tablet Take 2 tablets (40 mg total) by mouth 2 (two) times daily. Hold afternoon dose if your weight at home is under 160#  120 tablet  11  . warfarin (COUMADIN) 5 MG tablet as directed.        Review of Systems  Constitutional: Negative.   HENT: Negative.   Eyes: Negative.   Cardiovascular: Positive for leg swelling.  Gastrointestinal: Negative.   Endocrine: Negative.   Skin: Negative.   Allergic/Immunologic: Negative.   Neurological: Negative.   Hematological: Negative.   Psychiatric/Behavioral: Negative.   All other systems reviewed and are negative.    BP 108/60  Pulse 60  Ht 5\' 9"  (1.753 m)  Wt 170 lb 8 oz (77.338 kg)  BMI 25.17 kg/m2  Physical Exam  Nursing note and vitals reviewed. Constitutional: He is oriented to person, place, and time. He appears well-developed and well-nourished.  HENT:  Head: Normocephalic.  Nose: Nose normal.  Mouth/Throat: Oropharynx is clear and moist.  Eyes: Conjunctivae are normal. Pupils are equal, round, and reactive to light.  Neck: Normal range of motion. Neck supple. No JVD present.  Cardiovascular: Normal rate, regular rhythm, S1 normal, S2 normal and intact distal pulses.  Exam reveals no gallop and no friction rub.   Murmur heard.  Crescendo systolic murmur is present with a grade of 2/6  Trace  pitting edema bilaterally below the knees  Pulmonary/Chest:  Effort normal. No respiratory distress. He has decreased breath sounds in the right lower field and the left lower field. He has no wheezes. He has no rales. He exhibits no tenderness.  Abdominal: Soft. Bowel sounds are normal. He exhibits no distension. There is no tenderness.  Musculoskeletal: Normal range of motion. He exhibits no edema and no tenderness.  Lymphadenopathy:    He has no cervical adenopathy.  Neurological: He is alert and oriented to person, place, and time. Coordination normal.  Skin: Skin is warm and dry. No rash noted. No erythema.  Psychiatric: He has a normal mood and affect. His behavior is normal. Judgment and thought  content normal.      Assessment and Plan

## 2013-06-10 NOTE — Telephone Encounter (Signed)
Spoke with patient and he refuses to take 90 units he states "it will kill me" he has not been taking any insulin and his sugars are below 200, pt states he will start back tonight taking 20 units and will discuss with Dr. Alphonsus Sias on Monday @ 2:45pm

## 2013-06-10 NOTE — Assessment & Plan Note (Signed)
Currently with no symptoms of angina. No further workup at this time. Continue current medication regimen. Underlying ischemic cardiomyopathy

## 2013-06-11 NOTE — Telephone Encounter (Signed)
That sounds appropriate I don't know how they could have increased his insulin that much!

## 2013-06-13 ENCOUNTER — Encounter: Payer: Self-pay | Admitting: Internal Medicine

## 2013-06-13 ENCOUNTER — Ambulatory Visit (INDEPENDENT_AMBULATORY_CARE_PROVIDER_SITE_OTHER): Payer: Medicare Other | Admitting: Family Medicine

## 2013-06-13 ENCOUNTER — Ambulatory Visit: Payer: Medicare Other

## 2013-06-13 ENCOUNTER — Ambulatory Visit (INDEPENDENT_AMBULATORY_CARE_PROVIDER_SITE_OTHER): Payer: Medicare Other | Admitting: Internal Medicine

## 2013-06-13 VITALS — BP 110/70 | HR 56 | Temp 97.5°F | Ht 69.0 in | Wt 173.0 lb

## 2013-06-13 DIAGNOSIS — I4891 Unspecified atrial fibrillation: Secondary | ICD-10-CM

## 2013-06-13 DIAGNOSIS — N179 Acute kidney failure, unspecified: Secondary | ICD-10-CM

## 2013-06-13 DIAGNOSIS — L97209 Non-pressure chronic ulcer of unspecified calf with unspecified severity: Secondary | ICD-10-CM

## 2013-06-13 DIAGNOSIS — L97909 Non-pressure chronic ulcer of unspecified part of unspecified lower leg with unspecified severity: Secondary | ICD-10-CM | POA: Insufficient documentation

## 2013-06-13 DIAGNOSIS — E1159 Type 2 diabetes mellitus with other circulatory complications: Secondary | ICD-10-CM

## 2013-06-13 DIAGNOSIS — I5022 Chronic systolic (congestive) heart failure: Secondary | ICD-10-CM

## 2013-06-13 DIAGNOSIS — Z5181 Encounter for therapeutic drug level monitoring: Secondary | ICD-10-CM

## 2013-06-13 DIAGNOSIS — L97901 Non-pressure chronic ulcer of unspecified part of unspecified lower leg limited to breakdown of skin: Secondary | ICD-10-CM

## 2013-06-13 DIAGNOSIS — R319 Hematuria, unspecified: Secondary | ICD-10-CM

## 2013-06-13 DIAGNOSIS — Z7901 Long term (current) use of anticoagulants: Secondary | ICD-10-CM

## 2013-06-13 MED ORDER — SILVER SULFADIAZINE 1 % EX CREA: 1.0000 "application " | TOPICAL_CREAM | Freq: Every day | CUTANEOUS | Status: DC

## 2013-06-13 NOTE — Assessment & Plan Note (Signed)
Hopefully still resolved with fluid given back Will recheck

## 2013-06-13 NOTE — Assessment & Plan Note (Signed)
Dressed with silvadene here and DSD Rx given for him to continue this

## 2013-06-13 NOTE — Assessment & Plan Note (Signed)
No gross hematuria now Due for cysto on Thursday

## 2013-06-13 NOTE — Patient Instructions (Signed)
Your goal weight to balance your kidneys and heart is probably around 165# at home in the morning.

## 2013-06-13 NOTE — Progress Notes (Signed)
Subjective:    Patient ID: [name], male    DOB: 10/15/1940, 72 y.o.   MRN: [id number]  HPI Reviewed his status Recent hospitalization for hematuria--felt to be related his INR of 3.5 but having urology follow up Had CT scan which was okay Due for cysto in 3 days No dysuria  Also had AKI felt to be due to overdiuresis Torsemide cut back Getting the feet swelling again Breathing is stable--"not okay" Gets DOE easily---like even walking around his house  No chest pain No palpitations--- but occasional feels something at night (not specific)  Tripped over something yesterday Scraped up his right shin  Current Outpatient Prescriptions on File Prior to Visit  Medication Sig Dispense Refill  . albuterol (PROVENTIL) (5 MG/ML) 0.5% nebulizer solution Take 0.5 mLs (2.5 mg total) by nebulization every 6 (six) hours as needed for wheezing or shortness of breath.  150 mL  1  . amiodarone (PACERONE) 200 MG tablet Take 200 mg by mouth every morning.      Marland Kitchen atorvastatin (LIPITOR) 20 MG tablet Take 20 mg by mouth at bedtime.       . carvedilol (COREG) 6.25 MG tablet Take 1 tablet (6.25 mg total) by mouth 2 (two) times daily with a meal.  60 tablet  5  . Cholecalciferol (VITAMIN D) 1000 UNITS capsule Take 1,000 Units by mouth every morning.      [name] Calcium (STOOL SOFTENER PO) Take 1 capsule by mouth 2 (two) times daily.      . insulin detemir (LEVEMIR) 100 UNIT/ML injection Inject 20 Units into the skin at bedtime.      . isosorbide mononitrate (IMDUR) 30 MG 24 hr tablet Take 30 mg by mouth at bedtime.      Marland Kitchen LORazepam (ATIVAN) 0.5 MG tablet Take 0.5-1 mg by mouth at bedtime as needed (for sleep).       . Multiple Vitamin (MULTIVITAMIN WITH MINERALS) TABS Take 1 tablet by mouth daily.      Marland Kitchen oxyCODONE (OXY IR/ROXICODONE) 5 MG immediate release tablet Take 1 tablet (5 mg total) by mouth every 6 (six) hours as needed for moderate pain.  20 tablet  0  . spironolactone (ALDACTONE) 25  MG tablet Take 100 mg by mouth daily.       Marland Kitchen torsemide (DEMADEX) 20 MG tablet Take 2 tablets (40 mg total) by mouth 2 (two) times daily. Hold afternoon dose if your weight at home is under 160#  120 tablet  11   No current facility-administered medications on file prior to visit.    No Known Allergies  Past Medical History  Diagnosis Date  . Atrial fibrillation      S/P ablation  . CAD (coronary artery disease)     S/P MI 1980's----------------------Dr McDowell  . Hyperlipidemia   . CHF (congestive heart failure)   . 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 BPH (benign prostatic hypertrophy)   . Stroke   . Sleep disturbance     Past Surgical History  Procedure Laterality Date  . Coronary artery bypass graft    . Appendectomy    . Tonsillectomy    . Upper gastrointestinal endoscopy      Gastritis , ? H. pylori 08/28/1999  . Atrial ablation surgery      Family History  Problem Relation Age of Onset  . Parkinson's disease Mother   . Heart disease Father   . Skin cancer Sister   .  Heart attack Mother     History   Social History  . Marital Status: Single    Spouse Name: N/A    Number of Children: N/A  . Years of Education: N/A   Occupational History  . disabled due to heart disease    Social History Main Topics  . Smoking status: Former Smoker -- 1.00 packs/day    Types: Cigarettes    Quit date: 07/15/1973  . Smokeless tobacco: Never Used  . Alcohol Use: No  . Drug Use: No  . Sexual Activity: Not on file   Other Topics Concern  . Not on file   Social History Narrative   Single--lives alone   Stays with sister with Altzheimer's frequently   Has living will   Brother, Gerlene Burdock has health care POA   Would accept resuscitation but no prolonged artificial ventilation   No feeding tube if cognitively unaware            Review of Systems Appetite still poor--hasn't come back since hospital Bowels okay Sleeping okay-- no  PND    Objective:   Physical Exam  Constitutional: He appears well-developed and well-nourished. No distress.  Neck: Normal range of motion. Neck supple.  Cardiovascular: Normal rate and regular rhythm.  Exam reveals no gallop.   No murmur heard. Pulmonary/Chest: Effort normal. No respiratory distress. He has no wheezes.  Faint bibasilar crackles  Abdominal: He exhibits distension.  Slight fluid distention again in abdomen  Musculoskeletal:  1+ tight swelling in calves  Lymphadenopathy:    He has no cervical adenopathy.  Skin:  Superficial skin tear on anterior calf---4x6cm and 2x2cm above that          Assessment & Plan:

## 2013-06-13 NOTE — Assessment & Plan Note (Signed)
Regular now Will restart coumadin after the cystoscopy

## 2013-06-13 NOTE — Patient Instructions (Addendum)
Restart Coumadin on Thursday (11/13) to take 1 tablet every day except take 1 1/2 tablets on Monday.  Re-check in 4 weeks. (normally 6)

## 2013-06-13 NOTE — Assessment & Plan Note (Signed)
Chronic DOE Close to baseline Weight up due to less diuretics--due to AKI Okay for now Discussed weight goal of 165#

## 2013-06-13 NOTE — Assessment & Plan Note (Signed)
Never took the increased dose of 100 or 90 A1c was fine on just 20 Will go back to the 20

## 2013-06-14 LAB — BASIC METABOLIC PANEL
BUN: 38 mg/dL — ABNORMAL HIGH (ref 6–23)
Calcium: 8.9 mg/dL (ref 8.4–10.5)
Chloride: 99 mEq/L (ref 96–112)
Creatinine, Ser: 1.5 mg/dL (ref 0.4–1.5)
GFR: 47.5 mL/min — ABNORMAL LOW (ref >60.00)
Glucose, Bld: 165 mg/dL — ABNORMAL HIGH (ref 70–99)
Potassium: 4.4 mEq/L (ref 3.5–5.1)
Sodium: 135 mEq/L (ref 135–145)

## 2013-06-15 ENCOUNTER — Telehealth: Payer: Self-pay | Admitting: Internal Medicine

## 2013-06-15 LAB — CBC WITH DIFFERENTIAL/PLATELET
Basophils Absolute: 0 10*3/uL (ref 0.0–0.1)
Eosinophils Absolute: 0.1 10*3/uL (ref 0.0–0.7)
HCT: 34.5 % — ABNORMAL LOW (ref 39.0–52.0)
Lymphs Abs: 0.5 10*3/uL — ABNORMAL LOW (ref 0.7–4.0)
MCHC: 33.7 g/dL (ref 30.0–36.0)
MCV: 87.8 fl (ref 78.0–100.0)
Monocytes Absolute: 0.8 10*3/uL (ref 0.1–1.0)
Monocytes Relative: 19.5 % — ABNORMAL HIGH (ref 3.0–12.0)
Platelets: 140 10*3/uL — ABNORMAL LOW (ref 150.0–400.0)
RDW: 17.5 % — ABNORMAL HIGH (ref 11.5–14.6)

## 2013-06-15 NOTE — Telephone Encounter (Signed)
Patient notified as instructed by telephone. 

## 2013-06-15 NOTE — Telephone Encounter (Signed)
Would wash gently, don't scrub.  Then apply silvadene and bandage daily.  Thanks.

## 2013-06-15 NOTE — Telephone Encounter (Signed)
Patient Information:  Caller Name: [name]  Phone: [phone]  Patient: [name], [name]  Gender: Male  DOB: 08/19/1940  Age: 72 Years  PCP: [name] [facility])  Office Follow Up:  Does the office need to follow up with this patient?: Yes  Instructions For The Office: Needs clarification of wound care- Is applying Silvadene cream to superficial skin tear on anterior calf. Asking if he is supposed to cleanse wound prior to applying Silvadene cream? Verify dressing change is every day.   Symptoms  Reason For Call & Symptoms: [name] states he is placing a topical cream on his leg.  Has a superficial skin tear to anterior calf on his leg after a fall on 06/13/13. Was seen in office 06/13/13 . Was told to change bandage every other day. Asking whether to wash or cleanse his leg prior to applying  Silvadene cream and new bandage? Also, verify dressing change is daily rather than every other day. Per [facility] has orders to apply cream once daily. Please call [name] at [phone]. May not be able to reach [name] on 06/16/13- is scheduled for a Cystoscopy on 06/16/13.  Reviewed Health History In EMR: Yes  Reviewed Medications In EMR: Yes  Reviewed Allergies In EMR: Yes  Reviewed Surgeries / Procedures: Yes  Date of Onset of Symptoms: 06/13/2013  Treatments Tried: Silvadene cream and dressing change  Treatments Tried Worked: No  Guideline(s) Used:  No Protocol Available - Sick Adult  Disposition Per Guideline:   Discuss with PCP and Callback by Nurse Today  Reason For Disposition Reached:   Nursing judgment  Advice Given:  N/A  Patient Will Follow Care Advice:  YES

## 2013-06-16 ENCOUNTER — Ambulatory Visit: Payer: Medicare Other | Admitting: Internal Medicine

## 2013-06-16 ENCOUNTER — Ambulatory Visit: Payer: Medicare Other

## 2013-06-16 ENCOUNTER — Telehealth: Payer: Self-pay | Admitting: Family Medicine

## 2013-06-16 NOTE — Telephone Encounter (Signed)
Okay to stop the coumadin before the procedure  Please coordinate with them about restarting

## 2013-06-16 NOTE — Telephone Encounter (Signed)
Seleta from Dr. Hilario Quarry office called stating that pt has been dx with bladder tumor and they need the ok to d/c Coumadin 5 days prior to procedure to remove it.  Cb [id number] [phone]

## 2013-06-17 NOTE — Telephone Encounter (Signed)
Clearance letter faxed and scanned.

## 2013-06-17 NOTE — Telephone Encounter (Signed)
Okay to send clearance letter Probably better not to bridge---too high a risk of rebleeding since there is a cancer there

## 2013-06-17 NOTE — Telephone Encounter (Signed)
[facility] requesting clearance letter be faxed to their office at [phone].  Ok to fax?  Do you want pt to go on Lovenox bridge?  Procedure date is TBD.  Dr. Hilario Quarry office needs clearance from Dr. Mariah Milling as well.

## 2013-06-21 ENCOUNTER — Encounter: Payer: Self-pay | Admitting: *Deleted

## 2013-06-24 ENCOUNTER — Ambulatory Visit: Payer: Medicare Other | Admitting: Cardiovascular Disease

## 2013-06-28 ENCOUNTER — Telehealth: Payer: Self-pay

## 2013-06-28 ENCOUNTER — Other Ambulatory Visit: Payer: Self-pay | Admitting: *Deleted

## 2013-06-28 MED ORDER — ALBUTEROL SULFATE (5 MG/ML) 0.5% IN NEBU: 2.5000 mg | INHALATION_SOLUTION | Freq: Four times a day (QID) | RESPIRATORY_TRACT | Status: DC | PRN

## 2013-06-28 MED ORDER — INSULIN DETEMIR 100 UNIT/ML ~~LOC~~ SOLN: 20.0000 [IU] | Freq: Every day | SUBCUTANEOUS | Status: DC

## 2013-06-28 NOTE — Telephone Encounter (Signed)
Pt said [facility] brought out oxygen equipment and pt said [facility] could not get his permanent address correct and pt told [facility] to come pick up the Oxygen; pt was told would have to have order from Dr Alphonsus Sias to pick up oxygen. Please advise.pt request cb.

## 2013-06-30 NOTE — Telephone Encounter (Signed)
Please figure out what is going on He needs the oxygen---see if you can get him to settle down--especially if he has the equipment there already (doesn't seem like a good idea to pick it up and then need to get it from another company)

## 2013-07-04 NOTE — Telephone Encounter (Signed)
Please check with the company and see if they can do it this way

## 2013-07-04 NOTE — Telephone Encounter (Signed)
Spoke with patient and he states that the oxygen will cost him $26.00 a month and he states he can't afford it, he also states that [facility] delivered the oxygen to his sister house and he doesn't want his name on anything at his sister house because she's on housing and could get put out. I asked how did they get the address to his sister house and he states he gave it to them because his house is such a mess that he didn't want anyone to come in there. He wants the oxygen at his sister house but billed to his address and he said if that couldn't happen then he doesn't want it.

## 2013-07-05 ENCOUNTER — Telehealth: Payer: Self-pay | Admitting: Internal Medicine

## 2013-07-05 ENCOUNTER — Encounter: Payer: Self-pay | Admitting: Internal Medicine

## 2013-07-05 NOTE — Telephone Encounter (Signed)
Good to hear

## 2013-07-05 NOTE — Telephone Encounter (Signed)
Pt called office this morning and wants to keep the oxygen. Also oxygen has been moved from his sister house to his house.

## 2013-07-07 ENCOUNTER — Ambulatory Visit (INDEPENDENT_AMBULATORY_CARE_PROVIDER_SITE_OTHER): Payer: Medicare Other | Admitting: Internal Medicine

## 2013-07-07 ENCOUNTER — Telehealth: Payer: Self-pay | Admitting: Internal Medicine

## 2013-07-07 ENCOUNTER — Encounter: Payer: Self-pay | Admitting: Internal Medicine

## 2013-07-07 VITALS — BP 98/58 | HR 60 | Temp 97.3°F | Ht 69.0 in | Wt 177.5 lb

## 2013-07-07 DIAGNOSIS — L97901 Non-pressure chronic ulcer of unspecified part of unspecified lower leg limited to breakdown of skin: Secondary | ICD-10-CM

## 2013-07-07 DIAGNOSIS — E162 Hypoglycemia, unspecified: Secondary | ICD-10-CM

## 2013-07-07 DIAGNOSIS — L97209 Non-pressure chronic ulcer of unspecified calf with unspecified severity: Secondary | ICD-10-CM

## 2013-07-07 NOTE — Telephone Encounter (Signed)
Patient Information:  Caller Name: Nayef  Phone: [phone]  Patient: Carol, Regan  Gender: Male  DOB: [birth year]  Age: 72 Years  PCP: Tillman Abide [facility])  Office Follow Up:  Does the office need to follow up with this patient?: Yes  Instructions For The Office: No appt available. Patinet having new wound issues.  please contact.  RN Note:  No appt available. Patinet having new wound issues.  please contact.  Symptoms  Reason For Call & Symptoms: Patient states he fell and was seen in office 06/13/13  and injured right leg/shin area  and saw Dr. Alphonsus Sias and he prescribed Silvadene and wound care.  He states the area was healing well.  The open places have close.  Today, he has fluid collection on wound, "big sac of fluid under the skin  , sack of water or blood size  "two tennis balls". No drainage. +redness, no pain. Reports recent leg swelling as well.  Afebrile.  Reviewed Health History In EMR: Yes  Reviewed Medications In EMR: Yes  Reviewed Allergies In EMR: Yes  Reviewed Surgeries / Procedures: Yes  Date of Onset of Symptoms: 07/07/2013  Guideline(s) Used:  Wound Infection  Disposition Per Guideline:   See Today in Office  Reason For Disposition Reached:   Skin redness around the wound larger than 2 inches (5 cm)  Advice Given:  Call Back If:   Wound becomes more tender  Redness starts to spread  Pus, drainage, or fever occurs  You become worse  RN Overrode Recommendation:  Make Appointment  No appt available. Patinet having new wound issues.  please contact.

## 2013-07-07 NOTE — Assessment & Plan Note (Signed)
With secondary blister now  PROCEDURE Blister removed with sterile pickups and scalpel Large amount of serous fluid Partially granulated skin below Redressed with silvadene and sterile gauze

## 2013-07-07 NOTE — Progress Notes (Signed)
Pre-visit discussion using our clinic review tool. No additional management support is needed unless otherwise documented below in the visit note.  

## 2013-07-07 NOTE — Assessment & Plan Note (Signed)
Into 40's and symptomatic at night Will decrease the insulin to 15

## 2013-07-07 NOTE — Telephone Encounter (Signed)
Dr Alphonsus Sias said will see pt today at 4:45 pm. Advised pt and he will keep legs elevated until seen. If condition changes or worsens prior to appt pt will cb.(no one inoffice who can add on appt now but Revonda Standard will have appt added on at [facility] return).

## 2013-07-07 NOTE — Telephone Encounter (Signed)
Pt spoke with Revonda Standard at front desk concerned has not received cb. Spoke with Dr Reece Agar and Dr Para March and advised pt needs to be seen today but no available appts. Please advise if can work pt in on your schedule this afternoon. (also texted Dr Alphonsus Sias).

## 2013-07-07 NOTE — Patient Instructions (Signed)
Decrease the insulin to 15 units daily

## 2013-07-07 NOTE — Progress Notes (Signed)
Subjective:    Patient ID: [name], male    DOB: 1940-10-06, 72 y.o.   MRN: [id number]  HPI Felt the skin area was looking "well" Stopped the silvadene  Then sudden massive swelling under that wound and now distended with fluid Uncomfortable but not really painful No discharge  Having low sugar reactions Needs to eat at night to prevent this  Current Outpatient Prescriptions on File Prior to Visit  Medication Sig Dispense Refill  . albuterol (PROVENTIL) (5 MG/ML) 0.5% nebulizer solution Take 0.5 mLs (2.5 mg total) by nebulization every 6 (six) hours as needed for wheezing or shortness of breath.  150 mL  1  . amiodarone (PACERONE) 200 MG tablet Take 200 mg by mouth every morning.      Marland Kitchen atorvastatin (LIPITOR) 20 MG tablet Take 20 mg by mouth at bedtime.       . carvedilol (COREG) 6.25 MG tablet Take 1 tablet (6.25 mg total) by mouth 2 (two) times daily with a meal.  60 tablet  5  . Cholecalciferol (VITAMIN D) 1000 UNITS capsule Take 1,000 Units by mouth every morning.      Tery Sanfilippo Calcium (STOOL SOFTENER PO) Take 1 capsule by mouth 2 (two) times daily.      . insulin detemir (LEVEMIR) 100 UNIT/ML injection Inject 0.2 mLs (20 Units total) into the skin at bedtime.  10 mL  11  . isosorbide mononitrate (IMDUR) 30 MG 24 hr tablet Take 30 mg by mouth at bedtime.      Marland Kitchen LORazepam (ATIVAN) 0.5 MG tablet Take 0.5-1 mg by mouth at bedtime as needed (for sleep).       . Multiple Vitamin (MULTIVITAMIN WITH MINERALS) TABS Take 1 tablet by mouth daily.      Marland Kitchen oxyCODONE (OXY IR/ROXICODONE) 5 MG immediate release tablet Take 1 tablet (5 mg total) by mouth every 6 (six) hours as needed for moderate pain.  20 tablet  0  . silver sulfADIAZINE (SILVADENE) 1 % cream Apply 1 application topically daily.  50 g  0  . spironolactone (ALDACTONE) 25 MG tablet Take 100 mg by mouth daily.       Marland Kitchen torsemide (DEMADEX) 20 MG tablet Take 2 tablets (40 mg total) by mouth 2 (two) times daily. Hold afternoon  dose if your weight at home is under 160#  120 tablet  11   No current facility-administered medications on file prior to visit.    No Known Allergies  Past Medical History  Diagnosis Date  . Atrial fibrillation      S/P ablation  . CAD (coronary artery disease)     S/P MI 1980's----------------------Dr McDowell  . Hyperlipidemia   . CHF (congestive heart failure)   . 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 BPH (benign prostatic hypertrophy)   . Stroke   . Sleep disturbance     Past Surgical History  Procedure Laterality Date  . Coronary artery bypass graft    . Appendectomy    . Tonsillectomy    . Upper gastrointestinal endoscopy      Gastritis , ? H. pylori 08/28/1999  . Atrial ablation surgery      Family History  Problem Relation Age of Onset  . Parkinson's disease Mother   . Heart disease Father   . Skin cancer Sister   . Heart attack Mother     History   Social History  . Marital Status: Single    Spouse  Name: N/A    Number of Children: N/A  . Years of Education: N/A   Occupational History  . disabled due to heart disease    Social History Main Topics  . Smoking status: Former Smoker -- 1.00 packs/day    Types: Cigarettes    Quit date: 07/15/1973  . Smokeless tobacco: Never Used  . Alcohol Use: No  . Drug Use: No  . Sexual Activity: Not on file   Other Topics Concern  . Not on file   Social History Narrative   Single--lives alone   Stays with sister with Altzheimer's frequently   Has living will   Brother, Gerlene Burdock has health care POA   Would accept resuscitation but no prolonged artificial ventilation   No feeding tube if cognitively unaware            Review of Systems No fever Appetite is still not great--but still eating    Objective:   Physical Exam  Constitutional: He appears well-developed and well-nourished. No distress.  Skin:  Huge (~8 x 12cm) blister on right lower leg Not  inflamed Tense Not blood  Psychiatric: He has a normal mood and affect. His behavior is normal.          Assessment & Plan:

## 2013-07-11 ENCOUNTER — Ambulatory Visit (INDEPENDENT_AMBULATORY_CARE_PROVIDER_SITE_OTHER): Payer: Medicare Other | Admitting: Internal Medicine

## 2013-07-11 ENCOUNTER — Other Ambulatory Visit: Payer: Self-pay | Admitting: Internal Medicine

## 2013-07-11 ENCOUNTER — Other Ambulatory Visit: Payer: Self-pay

## 2013-07-11 ENCOUNTER — Encounter: Payer: Self-pay | Admitting: Internal Medicine

## 2013-07-11 VITALS — BP 102/60 | HR 60 | Temp 97.8°F | Wt 179.8 lb

## 2013-07-11 DIAGNOSIS — L97209 Non-pressure chronic ulcer of unspecified calf with unspecified severity: Secondary | ICD-10-CM

## 2013-07-11 DIAGNOSIS — L97901 Non-pressure chronic ulcer of unspecified part of unspecified lower leg limited to breakdown of skin: Secondary | ICD-10-CM

## 2013-07-11 DIAGNOSIS — I5022 Chronic systolic (congestive) heart failure: Secondary | ICD-10-CM

## 2013-07-11 DIAGNOSIS — L02419 Cutaneous abscess of limb, unspecified: Secondary | ICD-10-CM

## 2013-07-11 DIAGNOSIS — L03115 Cellulitis of right lower limb: Secondary | ICD-10-CM | POA: Insufficient documentation

## 2013-07-11 MED ORDER — METOLAZONE 2.5 MG PO TABS: 2.5000 mg | ORAL_TABLET | Freq: Every day | ORAL | Status: DC | PRN

## 2013-07-11 MED ORDER — SILVER SULFADIAZINE 1 % EX CREA: 1.0000 "application " | TOPICAL_CREAM | Freq: Every day | CUTANEOUS | Status: DC

## 2013-07-11 MED ORDER — ISOSORBIDE MONONITRATE ER 30 MG PO TB24: 30.0000 mg | ORAL_TABLET | Freq: Every day | ORAL | Status: DC

## 2013-07-11 MED ORDER — CEPHALEXIN 500 MG PO CAPS: 500.0000 mg | ORAL_CAPSULE | Freq: Three times a day (TID) | ORAL | Status: DC

## 2013-07-11 NOTE — Telephone Encounter (Signed)
Refill sent for Imdur 30 mg take one tablet daily.  

## 2013-07-11 NOTE — Telephone Encounter (Signed)
Received refill request electronically. Last refill 06/13/13. Is it okay to refill medication?

## 2013-07-11 NOTE — Assessment & Plan Note (Signed)
Ongoing edema Weight up  Will have him take zaroxlyn for the next 3 days and recheck later in the week

## 2013-07-11 NOTE — Assessment & Plan Note (Signed)
Will treat with cephalexin Change to broader coverage if doesn't improve

## 2013-07-11 NOTE — Assessment & Plan Note (Signed)
Ulcer redressed with silvadene and non stick dressings

## 2013-07-11 NOTE — Progress Notes (Signed)
Pre-visit discussion using our clinic review tool. No additional management support is needed unless otherwise documented below in the visit note.  

## 2013-07-11 NOTE — Patient Instructions (Signed)
Please use the metolazone 2.5mg  daily with the AM torsemide for only the next 3 mornings. Then we will review on Friday.

## 2013-07-11 NOTE — Progress Notes (Signed)
Subjective:    Patient ID: [name], male    DOB: Dec 31, 1940, 72 y.o.   MRN: [id number]  HPI Wound had not been improving  Now bright red Draining lots of fluid Now having pain Hard to stand on it No fever  Current Outpatient Prescriptions on File Prior to Visit  Medication Sig Dispense Refill  . albuterol (PROVENTIL) (5 MG/ML) 0.5% nebulizer solution Take 0.5 mLs (2.5 mg total) by nebulization every 6 (six) hours as needed for wheezing or shortness of breath.  150 mL  1  . amiodarone (PACERONE) 200 MG tablet Take 200 mg by mouth every morning.      Marland Kitchen atorvastatin (LIPITOR) 20 MG tablet Take 20 mg by mouth at bedtime.       . carvedilol (COREG) 6.25 MG tablet Take 1 tablet (6.25 mg total) by mouth 2 (two) times daily with a meal.  60 tablet  5  . Cholecalciferol (VITAMIN D) 1000 UNITS capsule Take 1,000 Units by mouth every morning.      Tery Sanfilippo Calcium (STOOL SOFTENER PO) Take 1 capsule by mouth 2 (two) times daily.      . insulin detemir (LEVEMIR) 100 UNIT/ML injection Inject 15 Units into the skin at bedtime.      Marland Kitchen LORazepam (ATIVAN) 0.5 MG tablet Take 0.5-1 mg by mouth at bedtime as needed (for sleep).       . Multiple Vitamin (MULTIVITAMIN WITH MINERALS) TABS Take 1 tablet by mouth daily.      Marland Kitchen oxyCODONE (OXY IR/ROXICODONE) 5 MG immediate release tablet Take 1 tablet (5 mg total) by mouth every 6 (six) hours as needed for moderate pain.  20 tablet  0  . silver sulfADIAZINE (SILVADENE) 1 % cream Apply 1 application topically daily.  50 g  0  . spironolactone (ALDACTONE) 25 MG tablet Take 100 mg by mouth daily.       Marland Kitchen torsemide (DEMADEX) 20 MG tablet Take 2 tablets (40 mg total) by mouth 2 (two) times daily. Hold afternoon dose if your weight at home is under 160#  120 tablet  11   No current facility-administered medications on file prior to visit.    No Known Allergies  Past Medical History  Diagnosis Date  . Atrial fibrillation      S/P ablation  . CAD  (coronary artery disease)     S/P MI 1980's----------------------Dr McDowell  . Hyperlipidemia   . CHF (congestive heart failure)   . 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 BPH (benign prostatic hypertrophy)   . Stroke   . Sleep disturbance     Past Surgical History  Procedure Laterality Date  . Coronary artery bypass graft    . Appendectomy    . Tonsillectomy    . Upper gastrointestinal endoscopy      Gastritis , ? H. pylori 08/28/1999  . Atrial ablation surgery      Family History  Problem Relation Age of Onset  . Parkinson's disease Mother   . Heart disease Father   . Skin cancer Sister   . Heart attack Mother     History   Social History  . Marital Status: Single    Spouse Name: N/A    Number of Children: N/A  . Years of Education: N/A   Occupational History  . disabled due to heart disease    Social History Main Topics  . Smoking status: Former Smoker -- 1.00 packs/day  Types: Cigarettes    Quit date: 07/15/1973  . Smokeless tobacco: Never Used  . Alcohol Use: No  . Drug Use: No  . Sexual Activity: Not on file   Other Topics Concern  . Not on file   Social History Narrative   Single--lives alone   Stays with sister with Altzheimer's frequently   Has living will   Brother, Gerlene Burdock has health care POA   Would accept resuscitation but no prolonged artificial ventilation   No feeding tube if cognitively unaware            Review of Systems Weight is up a lot Has been using the diuretics regularly    Objective:   Physical Exam  Constitutional: He appears well-developed. No distress.  Musculoskeletal: He exhibits edema.  1+ tense edema  Skin:  Right calf ulcer same size Now bright red and tender          Assessment & Plan:

## 2013-07-15 ENCOUNTER — Ambulatory Visit (INDEPENDENT_AMBULATORY_CARE_PROVIDER_SITE_OTHER): Payer: Medicare Other | Admitting: Internal Medicine

## 2013-07-15 ENCOUNTER — Encounter: Payer: Self-pay | Admitting: Internal Medicine

## 2013-07-15 VITALS — BP 100/70 | HR 60 | Temp 97.7°F | Wt 168.0 lb

## 2013-07-15 DIAGNOSIS — L03115 Cellulitis of right lower limb: Secondary | ICD-10-CM

## 2013-07-15 DIAGNOSIS — L02419 Cutaneous abscess of limb, unspecified: Secondary | ICD-10-CM

## 2013-07-15 MED ORDER — LEVOFLOXACIN 500 MG PO TABS: 500.0000 mg | ORAL_TABLET | Freq: Every day | ORAL | Status: DC

## 2013-07-15 NOTE — Progress Notes (Signed)
Subjective:    Patient ID: [name], male    DOB: 12-Dec-1940, 72 y.o.   MRN: [id number]  HPI Here with sister again  Did lose a lot of weight with the new diuretic Breathing is easy  Still having bad pains in the right leg Stabbing at times It looks better but still not good  Current Outpatient Prescriptions on File Prior to Visit  Medication Sig Dispense Refill  . albuterol (PROVENTIL) (5 MG/ML) 0.5% nebulizer solution Take 0.5 mLs (2.5 mg total) by nebulization every 6 (six) hours as needed for wheezing or shortness of breath.  150 mL  1  . amiodarone (PACERONE) 200 MG tablet Take 200 mg by mouth every morning.      Marland Kitchen atorvastatin (LIPITOR) 20 MG tablet Take 20 mg by mouth at bedtime.       . carvedilol (COREG) 6.25 MG tablet Take 1 tablet (6.25 mg total) by mouth 2 (two) times daily with a meal.  60 tablet  5  . cephALEXin (KEFLEX) 500 MG capsule Take 1 capsule (500 mg total) by mouth 3 (three) times daily.  30 capsule  0  . Cholecalciferol (VITAMIN D) 1000 UNITS capsule Take 1,000 Units by mouth every morning.      Tery Sanfilippo Calcium (STOOL SOFTENER PO) Take 1 capsule by mouth 2 (two) times daily.      . insulin detemir (LEVEMIR) 100 UNIT/ML injection Inject 15 Units into the skin at bedtime.      . isosorbide mononitrate (IMDUR) 30 MG 24 hr tablet Take 1 tablet (30 mg total) by mouth at bedtime.  30 tablet  6  . LORazepam (ATIVAN) 0.5 MG tablet Take 0.5-1 mg by mouth at bedtime as needed (for sleep).       . metolazone (ZAROXOLYN) 2.5 MG tablet Take 1 tablet (2.5 mg total) by mouth daily as needed.  30 tablet  0  . Multiple Vitamin (MULTIVITAMIN WITH MINERALS) TABS Take 1 tablet by mouth daily.      Marland Kitchen oxyCODONE (OXY IR/ROXICODONE) 5 MG immediate release tablet Take 1 tablet (5 mg total) by mouth every 6 (six) hours as needed for moderate pain.  20 tablet  0  . silver sulfADIAZINE (SILVADENE) 1 % cream Apply 1 application topically daily.  50 g  1  . spironolactone  (ALDACTONE) 25 MG tablet Take 100 mg by mouth daily.       Marland Kitchen SSD 1 % cream APPLY EVERY DAY  50 g  1  . torsemide (DEMADEX) 20 MG tablet Take 2 tablets (40 mg total) by mouth 2 (two) times daily. Hold afternoon dose if your weight at home is under 160#  120 tablet  11   No current facility-administered medications on file prior to visit.    No Known Allergies  Past Medical History  Diagnosis Date  . Atrial fibrillation      S/P ablation  . CAD (coronary artery disease)     S/P MI 1980's----------------------Dr McDowell  . Hyperlipidemia   . CHF (congestive heart failure)   . 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 BPH (benign prostatic hypertrophy)   . Stroke   . Sleep disturbance     Past Surgical History  Procedure Laterality Date  . Coronary artery bypass graft    . Appendectomy    . Tonsillectomy    . Upper gastrointestinal endoscopy      Gastritis , ? H. pylori 08/28/1999  . Atrial ablation  surgery      Family History  Problem Relation Age of Onset  . Parkinson's disease Mother   . Heart disease Father   . Skin cancer Sister   . Heart attack Mother     History   Social History  . Marital Status: Single    Spouse Name: N/A    Number of Children: N/A  . Years of Education: N/A   Occupational History  . disabled due to heart disease    Social History Main Topics  . Smoking status: Former Smoker -- 1.00 packs/day    Types: Cigarettes    Quit date: 07/15/1973  . Smokeless tobacco: Never Used  . Alcohol Use: No  . Drug Use: No  . Sexual Activity: Not on file   Other Topics Concern  . Not on file   Social History Narrative   Single--lives alone   Stays with sister with Altzheimer's frequently   Has living will   Brother, Gerlene Burdock has health care POA   Would accept resuscitation but no prolonged artificial ventilation   No feeding tube if cognitively unaware            Review of Systems No fever Gets some nausea  with the severe pain Loose stool with some incontinence on the antibiotic    Objective:   Physical Exam  Constitutional: He appears well-developed. No distress.  Musculoskeletal:  Edema gone  Skin:  Right calf ulcer covered with yellow slough---peeled off Still with deep redness at ulcer base and mild redness around wound (which is slightly better) Very tender          Assessment & Plan:

## 2013-07-15 NOTE — Assessment & Plan Note (Signed)
Not much better Edema is really down---will change the metolazone to twice a week Add levaquin  Wound cleaned of slough, silvadene and non stick dressings applied with gauze cling

## 2013-07-15 NOTE — Patient Instructions (Addendum)
Please start the levofloxacin antibiotic but finish the cephalexin also. Ask the pharmacist for a probiotic to take while you are on the antibiotics. Please decrease your warfarin to 1/2 tablet daily while you are on the levofloxacin. Continue to take the metolazone but only twice a week.

## 2013-07-18 ENCOUNTER — Ambulatory Visit (INDEPENDENT_AMBULATORY_CARE_PROVIDER_SITE_OTHER): Payer: Medicare Other | Admitting: Internal Medicine

## 2013-07-18 ENCOUNTER — Encounter: Payer: Self-pay | Admitting: Internal Medicine

## 2013-07-18 ENCOUNTER — Ambulatory Visit: Payer: Medicare Other

## 2013-07-18 ENCOUNTER — Ambulatory Visit (INDEPENDENT_AMBULATORY_CARE_PROVIDER_SITE_OTHER): Payer: Medicare Other | Admitting: Family Medicine

## 2013-07-18 VITALS — BP 98/50 | HR 58 | Temp 98.4°F | Wt 163.0 lb

## 2013-07-18 DIAGNOSIS — Z5181 Encounter for therapeutic drug level monitoring: Secondary | ICD-10-CM

## 2013-07-18 DIAGNOSIS — L97209 Non-pressure chronic ulcer of unspecified calf with unspecified severity: Secondary | ICD-10-CM

## 2013-07-18 DIAGNOSIS — L97901 Non-pressure chronic ulcer of unspecified part of unspecified lower leg limited to breakdown of skin: Secondary | ICD-10-CM

## 2013-07-18 DIAGNOSIS — I4891 Unspecified atrial fibrillation: Secondary | ICD-10-CM

## 2013-07-18 DIAGNOSIS — Z7901 Long term (current) use of anticoagulants: Secondary | ICD-10-CM

## 2013-07-18 MED ORDER — SILVER SULFADIAZINE 1 % EX CREA: 1.0000 "application " | TOPICAL_CREAM | Freq: Every day | CUTANEOUS | Status: DC

## 2013-07-18 NOTE — Patient Instructions (Signed)
Please take 1/2 warfarin daily (2.5mg ) while still on the levofloxacin--then go back to 5mg  daily. Don't take the metolazone if your home weight is under 160#

## 2013-07-18 NOTE — Progress Notes (Signed)
Subjective:    Patient ID: [name], male    DOB: 1940/12/25, 72 y.o.   MRN: [id number]  HPI Here with sister again Doing some better Not as painful with walking Still some pain when changing dressing Still drains some  Current Outpatient Prescriptions on File Prior to Visit  Medication Sig Dispense Refill  . albuterol (PROVENTIL) (5 MG/ML) 0.5% nebulizer solution Take 0.5 mLs (2.5 mg total) by nebulization every 6 (six) hours as needed for wheezing or shortness of breath.  150 mL  1  . amiodarone (PACERONE) 200 MG tablet Take 200 mg by mouth every morning.      Marland Kitchen atorvastatin (LIPITOR) 20 MG tablet Take 20 mg by mouth at bedtime.       . carvedilol (COREG) 6.25 MG tablet Take 1 tablet (6.25 mg total) by mouth 2 (two) times daily with a meal.  60 tablet  5  . cephALEXin (KEFLEX) 500 MG capsule Take 1 capsule (500 mg total) by mouth 3 (three) times daily.  30 capsule  0  . Cholecalciferol (VITAMIN D) 1000 UNITS capsule Take 1,000 Units by mouth every morning.      Tery Sanfilippo Calcium (STOOL SOFTENER PO) Take 1 capsule by mouth 2 (two) times daily.      . insulin detemir (LEVEMIR) 100 UNIT/ML injection Inject 15 Units into the skin at bedtime.      . isosorbide mononitrate (IMDUR) 30 MG 24 hr tablet Take 1 tablet (30 mg total) by mouth at bedtime.  30 tablet  6  . levofloxacin (LEVAQUIN) 500 MG tablet Take 1 tablet (500 mg total) by mouth daily.  10 tablet  0  . LORazepam (ATIVAN) 0.5 MG tablet Take 0.5-1 mg by mouth at bedtime as needed (for sleep).       . metolazone (ZAROXOLYN) 2.5 MG tablet Take 2.5 mg by mouth 2 (two) times a week.      . Multiple Vitamin (MULTIVITAMIN WITH MINERALS) TABS Take 1 tablet by mouth daily.      Marland Kitchen oxyCODONE (OXY IR/ROXICODONE) 5 MG immediate release tablet Take 1 tablet (5 mg total) by mouth every 6 (six) hours as needed for moderate pain.  20 tablet  0  . silver sulfADIAZINE (SILVADENE) 1 % cream Apply 1 application topically daily.  50 g  1  .  spironolactone (ALDACTONE) 25 MG tablet Take 100 mg by mouth daily.       Marland Kitchen SSD 1 % cream APPLY EVERY DAY  50 g  1  . torsemide (DEMADEX) 20 MG tablet Take 2 tablets (40 mg total) by mouth 2 (two) times daily. Hold afternoon dose if your weight at home is under 160#  120 tablet  11   No current facility-administered medications on file prior to visit.    No Known Allergies  Past Medical History  Diagnosis Date  . Atrial fibrillation      S/P ablation  . CAD (coronary artery disease)     S/P MI 1980's----------------------Dr McDowell  . Hyperlipidemia   . CHF (congestive heart failure)   . 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 BPH (benign prostatic hypertrophy)   . Stroke   . Sleep disturbance     Past Surgical History  Procedure Laterality Date  . Coronary artery bypass graft    . Appendectomy    . Tonsillectomy    . Upper gastrointestinal endoscopy      Gastritis , ? H. pylori 08/28/1999  .  Atrial ablation surgery      Family History  Problem Relation Age of Onset  . Parkinson's disease Mother   . Heart disease Father   . Skin cancer Sister   . Heart attack Mother     History   Social History  . Marital Status: Single    Spouse Name: N/A    Number of Children: N/A  . Years of Education: N/A   Occupational History  . disabled due to heart disease    Social History Main Topics  . Smoking status: Former Smoker -- 1.00 packs/day    Types: Cigarettes    Quit date: 07/15/1973  . Smokeless tobacco: Never Used  . Alcohol Use: No  . Drug Use: No  . Sexual Activity: Not on file   Other Topics Concern  . Not on file   Social History Narrative   Single--lives alone   Stays with sister with Altzheimer's frequently   Has living will   Brother, Gerlene Burdock has health care POA   Would accept resuscitation but no prolonged artificial ventilation   No feeding tube if cognitively unaware            Review of Systems No  fever Weight down a few pounds more    Objective:   Physical Exam  Constitutional: He appears well-developed. No distress.  Skin:  Right calf wound now has some early dark (but healthy) granulation Rest of wound is healthy pink now (not red) Much better!          Assessment & Plan:

## 2013-07-18 NOTE — Assessment & Plan Note (Signed)
Cellulitis is better Will finish out the levofloxacin Continue silvadene dressings---redone her with silvadene/non adherent dressings and cling

## 2013-07-18 NOTE — Addendum Note (Signed)
Addended by: Sueanne Margarita on: 07/18/2013 04:27 PM   Modules accepted: Orders

## 2013-07-26 ENCOUNTER — Other Ambulatory Visit: Payer: Self-pay | Admitting: *Deleted

## 2013-07-27 ENCOUNTER — Ambulatory Visit: Payer: Medicare Other | Admitting: Internal Medicine

## 2013-07-27 ENCOUNTER — Telehealth: Payer: Self-pay | Admitting: *Deleted

## 2013-07-27 MED ORDER — PROMETHAZINE HCL 25 MG PO TABS: 25.0000 mg | ORAL_TABLET | Freq: Three times a day (TID) | ORAL | Status: DC | PRN

## 2013-07-27 NOTE — Telephone Encounter (Signed)
Please make sure he is not having sig abdominal pain or fever If so, he should call rescue If not, can send Rx for phenergan 25mg  1 tid prn for nausea and vomiting #6 x 0  Will see in Friday as planned if he doesn't have to go to hospital before then

## 2013-07-27 NOTE — Telephone Encounter (Signed)
Per pt he's been throwing for the last 3 days, can't keep his meds down or food. We offered an appt but he said he couldn't come but wanted to know what Dr. Alphonsus Sias could do. Please advise

## 2013-07-27 NOTE — Telephone Encounter (Signed)
Per pt no abdominal pain really just cramps, no fever, rx sent to pharmacy and pt will keep his appt on friday

## 2013-07-29 ENCOUNTER — Ambulatory Visit (INDEPENDENT_AMBULATORY_CARE_PROVIDER_SITE_OTHER): Payer: Medicare Other | Admitting: Internal Medicine

## 2013-07-29 ENCOUNTER — Ambulatory Visit (INDEPENDENT_AMBULATORY_CARE_PROVIDER_SITE_OTHER)
Admission: RE | Admit: 2013-07-29 | Discharge: 2013-07-29 | Disposition: A | Discharge status: Home / Self Care | Payer: Medicare Other | Source: Ambulatory Visit | Attending: Internal Medicine | Admitting: Internal Medicine

## 2013-07-29 ENCOUNTER — Encounter: Payer: Self-pay | Admitting: Internal Medicine

## 2013-07-29 VITALS — BP 98/50 | HR 58 | Temp 98.4°F | Wt 168.0 lb

## 2013-07-29 DIAGNOSIS — L03115 Cellulitis of right lower limb: Secondary | ICD-10-CM

## 2013-07-29 DIAGNOSIS — R112 Nausea with vomiting, unspecified: Secondary | ICD-10-CM | POA: Insufficient documentation

## 2013-07-29 DIAGNOSIS — L02419 Cutaneous abscess of limb, unspecified: Secondary | ICD-10-CM

## 2013-07-29 DIAGNOSIS — I5022 Chronic systolic (congestive) heart failure: Secondary | ICD-10-CM

## 2013-07-29 NOTE — Assessment & Plan Note (Signed)
May be related to the levaquin which he finished yesterday Mild RUQ tenderness--- but appetite better and no fever No Murphy's sign

## 2013-07-29 NOTE — Assessment & Plan Note (Addendum)
Worse dyspnea CXR doesn't show pneumonia---?increase pulmonary markings Will have him restart the carvedilol

## 2013-07-29 NOTE — Progress Notes (Signed)
Subjective:    Patient ID: [name], male    DOB: 1940-10-29, 72 y.o.   MRN: [id number]  HPI Here with brother Has been hiccoughing a lot Still is though a little better  Started with nausea around a week ago Back of throat was dry and tongue sore Some vomiting--twice total Appetite is off---had first reasonable meal this AM. Some dinner at times--like a sandwich Phenergan has helped the nausea Has tolerated some boost  No fever No abdominal pain--but it bothers stomach when hiccoughing  Ran out of carvedilol Did finish the antibiotic  Current Outpatient Prescriptions on File Prior to Visit  Medication Sig Dispense Refill  . albuterol (PROVENTIL) (5 MG/ML) 0.5% nebulizer solution Take 0.5 mLs (2.5 mg total) by nebulization every 6 (six) hours as needed for wheezing or shortness of breath.  150 mL  1  . amiodarone (PACERONE) 200 MG tablet Take 200 mg by mouth every morning.      Marland Kitchen atorvastatin (LIPITOR) 20 MG tablet Take 20 mg by mouth at bedtime.       . carvedilol (COREG) 6.25 MG tablet Take 1 tablet (6.25 mg total) by mouth 2 (two) times daily with a meal.  60 tablet  5  . Cholecalciferol (VITAMIN D) 1000 UNITS capsule Take 1,000 Units by mouth every morning.      Tery Sanfilippo Calcium (STOOL SOFTENER PO) Take 1 capsule by mouth 2 (two) times daily.      . insulin detemir (LEVEMIR) 100 UNIT/ML injection Inject 15 Units into the skin at bedtime.      . isosorbide mononitrate (IMDUR) 30 MG 24 hr tablet Take 1 tablet (30 mg total) by mouth at bedtime.  30 tablet  6  . LORazepam (ATIVAN) 0.5 MG tablet Take 0.5-1 mg by mouth at bedtime as needed (for sleep).       . metolazone (ZAROXOLYN) 2.5 MG tablet Take 2.5 mg by mouth 2 (two) times a week. Do not take if your weight is under 160# at home      . Multiple Vitamin (MULTIVITAMIN WITH MINERALS) TABS Take 1 tablet by mouth daily.      Marland Kitchen oxyCODONE (OXY IR/ROXICODONE) 5 MG immediate release tablet Take 1 tablet (5 mg total) by  mouth every 6 (six) hours as needed for moderate pain.  20 tablet  0  . promethazine (PHENERGAN) 25 MG tablet Take 1 tablet (25 mg total) by mouth 3 (three) times daily as needed for nausea or vomiting.  6 tablet  0  . silver sulfADIAZINE (SILVADENE) 1 % cream Apply 1 application topically daily.  85 g  1  . spironolactone (ALDACTONE) 25 MG tablet Take 100 mg by mouth daily.       Marland Kitchen torsemide (DEMADEX) 20 MG tablet Take 2 tablets (40 mg total) by mouth 2 (two) times daily. Hold afternoon dose if your weight at home is under 160#  120 tablet  11   No current facility-administered medications on file prior to visit.    No Known Allergies  Past Medical History  Diagnosis Date  . Atrial fibrillation      S/P ablation  . CAD (coronary artery disease)     S/P MI 1980's----------------------Dr McDowell  . Hyperlipidemia   . CHF (congestive heart failure)   . 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 BPH (benign prostatic hypertrophy)   . Stroke   . Sleep disturbance     Past Surgical History  Procedure  Laterality Date  . Coronary artery bypass graft    . Appendectomy    . Tonsillectomy    . Upper gastrointestinal endoscopy      Gastritis , ? H. pylori 08/28/1999  . Atrial ablation surgery      Family History  Problem Relation Age of Onset  . Parkinson's disease Mother   . Heart disease Father   . Skin cancer Sister   . Heart attack Mother     History   Social History  . Marital Status: Single    Spouse Name: N/A    Number of Children: N/A  . Years of Education: N/A   Occupational History  . disabled due to heart disease    Social History Main Topics  . Smoking status: Former Smoker -- 1.00 packs/day    Types: Cigarettes    Quit date: 07/15/1973  . Smokeless tobacco: Never Used  . Alcohol Use: No  . Drug Use: No  . Sexual Activity: Not on file   Other Topics Concern  . Not on file   Social History Narrative   Single--lives alone    Stays with sister with Altzheimer's frequently   Has living will   Brother, Gerlene Burdock has health care POA   Would accept resuscitation but no prolonged artificial ventilation   No feeding tube if cognitively unaware            Review of Systems Now has injury on his left forearm Some SOB--may be more than his usual Soft stools--no diarrhea    Objective:   Physical Exam  Constitutional:  Slow moving Dyspnea just getting up on table  Neck: Normal range of motion. No thyromegaly present.  Cardiovascular: Normal rate.  Exam reveals no gallop and no friction rub.   Gr 2/6 systolic murmur  Pulmonary/Chest: He has no wheezes.  LLL fine crackles ?slight dullness  Abdominal: Soft. Bowel sounds are normal. He exhibits distension. There is tenderness.  Distended but not really dull like effusion Mild RUQ tenderness  Musculoskeletal: He exhibits no edema.  Lymphadenopathy:    He has no cervical adenopathy.  Skin:  Right calf wound is almost healed No inflammation  Left forearm has 2 small open areas that are granulating          Assessment & Plan:

## 2013-07-29 NOTE — Assessment & Plan Note (Signed)
Pretty much resolved He will finish out dressings till skin totally healed

## 2013-07-29 NOTE — Patient Instructions (Signed)
Please restart the carvedilol today. Let me know if your nausea and abdominal problems don't go away Go back to your regular dose of warfarin today--- 5mg  daily and 7.5mg  on Monday.

## 2013-08-01 ENCOUNTER — Telehealth: Payer: Self-pay

## 2013-08-01 NOTE — Telephone Encounter (Signed)
Good to hear We will plan the recheck on 1/12

## 2013-08-01 NOTE — Telephone Encounter (Signed)
Pt left v/m that he has lost weight and wanted to know if pt needed to be rechecked, pt was seen on 07/29/13; left v/m asking pt to cb.

## 2013-08-01 NOTE — Telephone Encounter (Signed)
Pt called back and today weight is 163. Pt feels better today; SOB is no worse than usual, pt said still has hiccups but not having hiccups quite as often. Rt leg is better, leg still has red area, one small spot that is slightly draining but appears to be scabbing over; no pain in leg. Pt request cb.

## 2013-08-04 DIAGNOSIS — N189 Chronic kidney disease, unspecified: Secondary | ICD-10-CM

## 2013-08-04 DIAGNOSIS — K922 Gastrointestinal hemorrhage, unspecified: Secondary | ICD-10-CM

## 2013-08-04 HISTORY — DX: Chronic kidney disease, unspecified: N18.9

## 2013-08-04 HISTORY — DX: Gastrointestinal hemorrhage, unspecified: K92.2

## 2013-08-08 ENCOUNTER — Telehealth: Payer: Self-pay

## 2013-08-08 ENCOUNTER — Other Ambulatory Visit: Payer: Self-pay | Admitting: Cardiovascular Disease

## 2013-08-08 ENCOUNTER — Other Ambulatory Visit: Payer: Self-pay | Admitting: *Deleted

## 2013-08-08 MED ORDER — AMIODARONE HCL 200 MG PO TABS: 200.0000 mg | ORAL_TABLET | Freq: Every morning | ORAL | Status: DC

## 2013-08-08 NOTE — Telephone Encounter (Signed)
Several days ago pt started probiotics and pt developed dark stools and occasional bright red blood on toilet tissue when he wipes; pt hs very loose stools, formed but very soft. No new abd. Pain and on and off nausea; last vomited 3 days ago and now nausea has eased. Pt has not taken fever; pt was to call Dr Silvio Pate if had problems; pt said he is not bad enough to go to [employer]. Pt request cb.  Advised pt if condition changes or worsens to cb. Pt voiced understanding.

## 2013-08-08 NOTE — Telephone Encounter (Signed)
Requested Prescriptions   Signed Prescriptions Disp Refills  . amiodarone (PACERONE) 200 MG tablet 30 tablet 3    Sig: Take 1 tablet (200 mg total) by mouth every morning.    Authorizing Provider: Minna Merritts    Ordering User: Britt Bottom

## 2013-08-09 NOTE — Telephone Encounter (Signed)
Sounds like he may be slowly coming back to normal I would recommend continuing the probiotic  As long as he doesn't have pain or fever, and is able to eat-- we should just wait and see if everything goes back to normal

## 2013-08-09 NOTE — Telephone Encounter (Signed)
Spoke with patient and advised results, he will call if anything changes

## 2013-08-15 ENCOUNTER — Ambulatory Visit (INDEPENDENT_AMBULATORY_CARE_PROVIDER_SITE_OTHER): Payer: Medicare HMO | Admitting: Internal Medicine

## 2013-08-15 ENCOUNTER — Ambulatory Visit (INDEPENDENT_AMBULATORY_CARE_PROVIDER_SITE_OTHER): Payer: Medicare HMO | Admitting: Family Medicine

## 2013-08-15 ENCOUNTER — Encounter: Payer: Self-pay | Admitting: Internal Medicine

## 2013-08-15 VITALS — BP 110/60 | HR 56 | Temp 98.0°F | Wt 165.0 lb

## 2013-08-15 DIAGNOSIS — E1159 Type 2 diabetes mellitus with other circulatory complications: Secondary | ICD-10-CM

## 2013-08-15 DIAGNOSIS — Z7901 Long term (current) use of anticoagulants: Secondary | ICD-10-CM

## 2013-08-15 DIAGNOSIS — I4891 Unspecified atrial fibrillation: Secondary | ICD-10-CM

## 2013-08-15 DIAGNOSIS — R112 Nausea with vomiting, unspecified: Secondary | ICD-10-CM

## 2013-08-15 DIAGNOSIS — D494 Neoplasm of unspecified behavior of bladder: Secondary | ICD-10-CM

## 2013-08-15 DIAGNOSIS — I5022 Chronic systolic (congestive) heart failure: Secondary | ICD-10-CM

## 2013-08-15 DIAGNOSIS — Z5181 Encounter for therapeutic drug level monitoring: Secondary | ICD-10-CM

## 2013-08-15 LAB — POCT INR: INR: 5.2

## 2013-08-15 NOTE — Assessment & Plan Note (Signed)
Regular now On coumadin Back on the carvedilol

## 2013-08-15 NOTE — Assessment & Plan Note (Signed)
Better but still not eating well ?blood in stool---would not do any testing for now

## 2013-08-15 NOTE — Progress Notes (Signed)
Pre-visit discussion using our clinic review tool. No additional management support is needed unless otherwise documented below in the visit note.  

## 2013-08-15 NOTE — Assessment & Plan Note (Signed)
Doesn't seem to get to point of stability --but may need to go ahead and let Dr Jasmine December resect the tumor so we know what we are dealing with  Will review with Dr Rockey Situ

## 2013-08-15 NOTE — Assessment & Plan Note (Signed)
Severe Weight stable Now with ascites NYHA class 3

## 2013-08-15 NOTE — Assessment & Plan Note (Signed)
Lab Results  Component Value Date   HGBA1C 7.1* 06/04/2013   Good control without hypoglycemia

## 2013-08-15 NOTE — Progress Notes (Signed)
Subjective:    Patient ID: [name], male    DOB: 06-21-1941, 73 y.o.   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  Has to rest walking up small hills No chest pain No palpitations  Current Outpatient Prescriptions on File Prior to Visit  Medication Sig Dispense Refill  . albuterol (PROVENTIL) (5 MG/ML) 0.5% nebulizer solution Take 0.5 mLs (2.5 mg total) by nebulization every 6 (six) hours as needed for wheezing or shortness of breath.  150 mL  1  . amiodarone (PACERONE) 200 MG tablet TAKE ONE TABLET BY MOUTH EVERY DAY  30 tablet  3  . amiodarone (PACERONE) 200 MG tablet Take 1 tablet (200 mg total) by mouth every morning.  30 tablet  3  . atorvastatin (LIPITOR) 20 MG tablet Take 20 mg by mouth at bedtime.       . carvedilol (COREG) 6.25 MG tablet Take 1 tablet (6.25 mg total) by mouth 2 (two) times daily with a meal.  60 tablet  5  . Cholecalciferol (VITAMIN D) 1000 UNITS capsule Take 1,000 Units by mouth every morning.      Mariane Baumgarten Calcium (STOOL SOFTENER PO) Take 1 capsule by mouth 2 (two) times daily.      . insulin detemir (LEVEMIR) 100 UNIT/ML injection Inject 15 Units into the skin at bedtime.      . isosorbide mononitrate (IMDUR) 30 MG 24 hr tablet Take 1 tablet (30 mg total) by mouth at bedtime.  30 tablet  6  . LORazepam (ATIVAN) 0.5 MG tablet Take 0.5-1 mg by mouth at bedtime as needed (for sleep).       . metolazone (ZAROXOLYN) 2.5 MG tablet Take 2.5 mg by mouth 2 (two) times a week. Do not take if  your weight is under 160# at home      . Multiple Vitamin (MULTIVITAMIN WITH MINERALS) TABS Take 1 tablet by mouth daily.      Marland Kitchen oxyCODONE (OXY IR/ROXICODONE) 5 MG immediate release tablet Take 1 tablet (5 mg total) by mouth every 6 (six) hours as needed for moderate pain.  20 tablet  0  . promethazine (PHENERGAN) 25 MG tablet Take 1 tablet (25 mg total) by mouth 3 (three) times daily as needed for nausea or vomiting.  6 tablet  0  . silver sulfADIAZINE (SILVADENE) 1 % cream Apply 1 application topically daily.  85 g  1  . spironolactone (ALDACTONE) 25 MG tablet Take 100 mg by mouth daily.       Marland Kitchen torsemide (DEMADEX) 20 MG tablet Take 2 tablets (40 mg total) by mouth 2 (two) times daily. Hold afternoon dose if your weight at home is under 160#  120 tablet  11   No current facility-administered medications on file prior to visit.    No Known Allergies  Past Medical History  Diagnosis Date  . Atrial fibrillation      S/P ablation  . CAD (coronary artery disease)     S/P MI 1980's----------------------Dr McDowell  . Hyperlipidemia   .  CHF (congestive heart failure)   . 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 BPH (benign prostatic hypertrophy)   . Stroke   . Sleep disturbance     Past Surgical History  Procedure Laterality Date  . Coronary artery bypass graft    . Appendectomy    . Tonsillectomy    . Upper gastrointestinal endoscopy      Gastritis , ? H. pylori 08/28/1999  . Atrial ablation surgery      Family History  Problem Relation Age of Onset  . Parkinson's disease Mother   . Heart disease Father   . Skin cancer Sister   . Heart attack Mother     History   Social History  . Marital Status: Single    Spouse Name: N/A    Number of Children: N/A  . Years of Education: N/A   Occupational History  . disabled due to heart disease    Social History Main Topics  . Smoking status: Former Smoker -- 1.00 packs/day    Types: Cigarettes      Quit date: 07/15/1973  . Smokeless tobacco: Never Used  . Alcohol Use: No  . Drug Use: No  . Sexual Activity: Not on file   Other Topics Concern  . Not on file   Social History Narrative   Single--lives alone   Stays with sister with Altzheimer's frequently   Has living will   Brother, Delfino Lovett has health care POA   Would accept resuscitation but no prolonged artificial ventilation   No feeding tube if cognitively unaware            Review of Systems Weight has been stable or down a few pounds Sugars have been running okay    Objective:   Physical Exam  Neck: Normal range of motion. Neck supple.  Cardiovascular: Normal rate and regular rhythm.  Exam reveals no gallop.   Coarse Gr 2/6 systolic murmur Regular!  Pulmonary/Chest: Effort normal. No respiratory distress. He has no wheezes. He has no rales.  Abdominal: He exhibits distension. There is no tenderness.  Apparent ascites with shifting dullness  Musculoskeletal: He exhibits no edema.  No edema  Lymphadenopathy:    He has no cervical adenopathy.  Skin:  Right calf is healed  Psychiatric: He has a normal mood and affect. His behavior is normal.          Assessment & Plan:

## 2013-08-18 ENCOUNTER — Ambulatory Visit (INDEPENDENT_AMBULATORY_CARE_PROVIDER_SITE_OTHER): Payer: Medicare HMO | Admitting: Family Medicine

## 2013-08-18 DIAGNOSIS — Z7901 Long term (current) use of anticoagulants: Secondary | ICD-10-CM

## 2013-08-18 DIAGNOSIS — I4891 Unspecified atrial fibrillation: Secondary | ICD-10-CM

## 2013-08-18 DIAGNOSIS — Z5181 Encounter for therapeutic drug level monitoring: Secondary | ICD-10-CM

## 2013-08-18 LAB — POCT INR: INR: 2.2

## 2013-08-19 ENCOUNTER — Other Ambulatory Visit: Payer: Self-pay | Admitting: Internal Medicine

## 2013-08-19 ENCOUNTER — Telehealth: Payer: Self-pay | Admitting: *Deleted

## 2013-08-19 NOTE — Telephone Encounter (Signed)
Patient called needing a cardiac clearance for a urology procedure w/ Dr.

## 2013-08-22 ENCOUNTER — Telehealth: Payer: Self-pay | Admitting: Internal Medicine

## 2013-08-22 ENCOUNTER — Ambulatory Visit: Payer: Medicare HMO | Admitting: Internal Medicine

## 2013-08-22 ENCOUNTER — Telehealth: Payer: Self-pay

## 2013-08-22 ENCOUNTER — Encounter: Payer: Self-pay | Admitting: Internal Medicine

## 2013-08-22 ENCOUNTER — Ambulatory Visit (INDEPENDENT_AMBULATORY_CARE_PROVIDER_SITE_OTHER): Payer: Medicare HMO | Admitting: Internal Medicine

## 2013-08-22 ENCOUNTER — Ambulatory Visit: Payer: Medicare HMO | Admitting: Family Medicine

## 2013-08-22 VITALS — BP 90/60 | HR 54 | Wt 173.0 lb

## 2013-08-22 DIAGNOSIS — R143 Flatulence: Secondary | ICD-10-CM

## 2013-08-22 DIAGNOSIS — K921 Melena: Secondary | ICD-10-CM | POA: Insufficient documentation

## 2013-08-22 DIAGNOSIS — R142 Eructation: Secondary | ICD-10-CM

## 2013-08-22 DIAGNOSIS — R140 Abdominal distension (gaseous): Secondary | ICD-10-CM | POA: Insufficient documentation

## 2013-08-22 DIAGNOSIS — I5022 Chronic systolic (congestive) heart failure: Secondary | ICD-10-CM

## 2013-08-22 DIAGNOSIS — R141 Gas pain: Secondary | ICD-10-CM

## 2013-08-22 DIAGNOSIS — D494 Neoplasm of unspecified behavior of bladder: Secondary | ICD-10-CM

## 2013-08-22 NOTE — Assessment & Plan Note (Signed)
Given his instability, would hold off on TURBT

## 2013-08-22 NOTE — Progress Notes (Signed)
Subjective:    Patient ID: [name], male    DOB: 1940/10/29, 73 y.o.   MRN: [id number]  HPI Here with brother  Having increased stomach trouble Abdominal pain--especially when moving bowels Has had black stools and thinks there is red blood at times also Appetite is poor Weight is up another 8# Some nausea Vomited 3 times---usually just gets nausea  Wonders if the spironolactone is bothering his stomach Breathing is bad---not clearly worse DOE with any incline walking, etc Weight seems to be in his stomach  Current Outpatient Prescriptions on File Prior to Visit  Medication Sig Dispense Refill  . albuterol (PROVENTIL) (5 MG/ML) 0.5% nebulizer solution Take 0.5 mLs (2.5 mg total) by nebulization every 6 (six) hours as needed for wheezing or shortness of breath.  150 mL  1  . amiodarone (PACERONE) 200 MG tablet TAKE ONE TABLET BY MOUTH EVERY DAY  30 tablet  3  . amiodarone (PACERONE) 200 MG tablet Take 1 tablet (200 mg total) by mouth every morning.  30 tablet  3  . atorvastatin (LIPITOR) 20 MG tablet Take 20 mg by mouth at bedtime.       . carvedilol (COREG) 6.25 MG tablet Take 1 tablet (6.25 mg total) by mouth 2 (two) times daily with a meal.  60 tablet  5  . Cholecalciferol (VITAMIN D) 1000 UNITS capsule Take 1,000 Units by mouth every morning.      Mariane Baumgarten Calcium (STOOL SOFTENER PO) Take 1 capsule by mouth 2 (two) times daily.      . insulin detemir (LEVEMIR) 100 UNIT/ML injection Inject 15 Units into the skin at bedtime.      . isosorbide mononitrate (IMDUR) 30 MG 24 hr tablet Take 1 tablet (30 mg total) by mouth at bedtime.  30 tablet  6  . LORazepam (ATIVAN) 0.5 MG tablet Take 0.5-1 mg by mouth at bedtime as needed (for sleep).       . metolazone (ZAROXOLYN) 2.5 MG tablet Take 2.5 mg by mouth 2 (two) times a week. Do not take if your weight is under 160# at home      . Multiple Vitamin (MULTIVITAMIN WITH MINERALS) TABS Take 1 tablet by mouth daily.      Marland Kitchen  oxyCODONE (OXY IR/ROXICODONE) 5 MG immediate release tablet Take 1 tablet (5 mg total) by mouth every 6 (six) hours as needed for moderate pain.  20 tablet  0  . promethazine (PHENERGAN) 25 MG tablet Take 1 tablet (25 mg total) by mouth 3 (three) times daily as needed for nausea or vomiting.  6 tablet  0  . silver sulfADIAZINE (SILVADENE) 1 % cream Apply 1 application topically daily.  85 g  1  . spironolactone (ALDACTONE) 25 MG tablet Take 100 mg by mouth daily.       Marland Kitchen torsemide (DEMADEX) 20 MG tablet Take 2 tablets (40 mg total) by mouth 2 (two) times daily. Hold afternoon dose if your weight at home is under 160#  120 tablet  11  . TRUETEST TEST test strip TEST BLOOD SUGAR 3 TO 5 TIMES DAILY.  200 each  1   No current facility-administered medications on file prior to visit.    No Known Allergies  Past Medical History  Diagnosis Date  . Atrial fibrillation      S/P ablation  . CAD (coronary artery disease)     S/P MI 1980's----------------------Dr McDowell  . Hyperlipidemia   . CHF (congestive heart failure)   .  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 BPH (benign prostatic hypertrophy)   . Stroke   . Sleep disturbance     Past Surgical History  Procedure Laterality Date  . Coronary artery bypass graft    . Appendectomy    . Tonsillectomy    . Upper gastrointestinal endoscopy      Gastritis , ? H. pylori 08/28/1999  . Atrial ablation surgery      Family History  Problem Relation Age of Onset  . Parkinson's disease Mother   . Heart disease Father   . Skin cancer Sister   . Heart attack Mother     History   Social History  . Marital Status: Single    Spouse Name: N/A    Number of Children: N/A  . Years of Education: N/A   Occupational History  . disabled due to heart disease    Social History Main Topics  . Smoking status: Former Smoker -- 1.00 packs/day    Types: Cigarettes    Quit date: 07/15/1973  . Smokeless tobacco:  Never Used  . Alcohol Use: No  . Drug Use: No  . Sexual Activity: Not on file   Other Topics Concern  . Not on file   Social History Narrative   Single--lives alone   Stays with sister with Altzheimer's frequently   Has living will   Brother, Delfino Lovett has health care POA   Would accept resuscitation but no prolonged artificial ventilation   No feeding tube if cognitively unaware            Review of Systems Feels like crying--emotions are more unstable No apparent fever    Objective:   Physical Exam  Constitutional: No distress.  Easy dyspnea--like getting up from supine  Cardiovascular: Normal rate and regular rhythm.  Exam reveals no gallop.   Soft systolic murmur  Pulmonary/Chest: Effort normal. No respiratory distress.  Slight crackles at right base  Abdominal: Soft. He exhibits distension. There is no tenderness. There is no rebound.  Genitourinary:  Slight perirectal erythema No rectal masses Stool dark brown-- slight heme positive  Musculoskeletal: He exhibits no edema.          Assessment & Plan:

## 2013-08-22 NOTE — Telephone Encounter (Signed)
Given 4:45PM appointment

## 2013-08-22 NOTE — Telephone Encounter (Signed)
Chastity in Dr Jasmine December office at [facility] left v/m requesting status of pts medical clearance. If pt is cleared for surgery fax medical clearance to [phone].Chastity request cb.

## 2013-08-22 NOTE — Assessment & Plan Note (Signed)
Doesn't seem to be exacerbated despite the weight gain (stage III CHF though and perhaps easier dyspnea) No change in diuretics for now

## 2013-08-22 NOTE — Assessment & Plan Note (Signed)
Only a little Will check CBC I am more concerned about the abdominal distention

## 2013-08-22 NOTE — Telephone Encounter (Signed)
Please let them know that he has worsened Seems to have ascites that is increasing I am scheduling an ultrasound and they should push back consideration of TURBT

## 2013-08-22 NOTE — Assessment & Plan Note (Signed)
I fear this is ascites If it is, could be from heart or some other cause (recent diagnosis of bladder tumor and I don't think he had GI/pelvic imaging No CT for now since I am concerned about his kidneys

## 2013-08-22 NOTE — Telephone Encounter (Signed)
Patient Information:  Caller Name: Nahuel  Phone: [phone]  Patient: [name], [name]  Gender: Male  DOB: [birth year]  Age: 73 Years  PCP: [name] [facility])  Office Follow Up:  Does the office need to follow up with this patient?: No  Instructions For The Office: N/A   Symptoms  Reason For Call & Symptoms: Pt calling regarding blood in stool. Has had diarrhea since at least 08/15/13. Up to 3 loose stools a day. Looks black in color and when it hits the water turns red. "Can't hold hardly anything in my stomach". Vomited 2-3 x last week. Has not vomited since last week; not sure what day. Still having some cramping and feeling dizzy after bm's.  Reviewed Health History In EMR: Yes  Reviewed Medications In EMR: Yes  Reviewed Allergies In EMR: Yes  Reviewed Surgeries / Procedures: Yes  Date of Onset of Symptoms: 08/22/2013  Guideline(s) Used:  Diarrhea  Rectal Bleeding  Disposition Per Guideline:   Go to ED Now (or to Office with PCP Approval)  Reason For Disposition Reached:   Severe dizziness (e.g., unable to stand, requires support to walk, feels like passing out now)  Advice Given:  N/A  Patient Will Follow Care Advice:  YES  Appointment Scheduled:  08/22/2013 10:30:00 Appt per Karena Addison via [facility] and [place] Appointment Scheduled Provider:  Arnette Norris [facility])

## 2013-08-23 ENCOUNTER — Encounter (HOSPITAL_COMMUNITY): Payer: Self-pay | Admitting: Emergency Medicine

## 2013-08-23 ENCOUNTER — Emergency Department (HOSPITAL_COMMUNITY): Payer: Medicare HMO

## 2013-08-23 ENCOUNTER — Inpatient Hospital Stay (HOSPITAL_COMMUNITY): Payer: Medicare HMO

## 2013-08-23 ENCOUNTER — Telehealth: Payer: Self-pay | Admitting: Radiology

## 2013-08-23 ENCOUNTER — Inpatient Hospital Stay (HOSPITAL_COMMUNITY)
Admission: EM | Admit: 2013-08-23 | Discharge: 2013-09-02 | DRG: 377 | Disposition: A | Discharge status: Skilled Nursing Facility | Payer: Medicare HMO | Attending: Internal Medicine | Admitting: Internal Medicine

## 2013-08-23 DIAGNOSIS — I10 Essential (primary) hypertension: Secondary | ICD-10-CM | POA: Diagnosis present

## 2013-08-23 DIAGNOSIS — D62 Acute posthemorrhagic anemia: Secondary | ICD-10-CM

## 2013-08-23 DIAGNOSIS — Z794 Long term (current) use of insulin: Secondary | ICD-10-CM

## 2013-08-23 DIAGNOSIS — K7460 Unspecified cirrhosis of liver: Secondary | ICD-10-CM

## 2013-08-23 DIAGNOSIS — I428 Other cardiomyopathies: Secondary | ICD-10-CM

## 2013-08-23 DIAGNOSIS — E872 Acidosis, unspecified: Secondary | ICD-10-CM | POA: Diagnosis present

## 2013-08-23 DIAGNOSIS — D689 Coagulation defect, unspecified: Secondary | ICD-10-CM

## 2013-08-23 DIAGNOSIS — I498 Other specified cardiac arrhythmias: Secondary | ICD-10-CM | POA: Diagnosis present

## 2013-08-23 DIAGNOSIS — I255 Ischemic cardiomyopathy: Secondary | ICD-10-CM | POA: Diagnosis present

## 2013-08-23 DIAGNOSIS — E875 Hyperkalemia: Secondary | ICD-10-CM

## 2013-08-23 DIAGNOSIS — G4730 Sleep apnea, unspecified: Secondary | ICD-10-CM | POA: Diagnosis present

## 2013-08-23 DIAGNOSIS — D6832 Hemorrhagic disorder due to extrinsic circulating anticoagulants: Secondary | ICD-10-CM

## 2013-08-23 DIAGNOSIS — Z91040 Latex allergy status: Secondary | ICD-10-CM

## 2013-08-23 DIAGNOSIS — D494 Neoplasm of unspecified behavior of bladder: Secondary | ICD-10-CM | POA: Diagnosis present

## 2013-08-23 DIAGNOSIS — N179 Acute kidney failure, unspecified: Secondary | ICD-10-CM

## 2013-08-23 DIAGNOSIS — Z23 Encounter for immunization: Secondary | ICD-10-CM

## 2013-08-23 DIAGNOSIS — I429 Cardiomyopathy, unspecified: Secondary | ICD-10-CM

## 2013-08-23 DIAGNOSIS — K209 Esophagitis, unspecified without bleeding: Secondary | ICD-10-CM

## 2013-08-23 DIAGNOSIS — N183 Chronic kidney disease, stage 3 unspecified: Secondary | ICD-10-CM | POA: Diagnosis present

## 2013-08-23 DIAGNOSIS — N189 Chronic kidney disease, unspecified: Secondary | ICD-10-CM

## 2013-08-23 DIAGNOSIS — Z808 Family history of malignant neoplasm of other organs or systems: Secondary | ICD-10-CM

## 2013-08-23 DIAGNOSIS — Z9089 Acquired absence of other organs: Secondary | ICD-10-CM

## 2013-08-23 DIAGNOSIS — K2901 Acute gastritis with bleeding: Principal | ICD-10-CM | POA: Diagnosis present

## 2013-08-23 DIAGNOSIS — R188 Other ascites: Secondary | ICD-10-CM

## 2013-08-23 DIAGNOSIS — I959 Hypotension, unspecified: Secondary | ICD-10-CM | POA: Diagnosis present

## 2013-08-23 DIAGNOSIS — N058 Unspecified nephritic syndrome with other morphologic changes: Secondary | ICD-10-CM | POA: Diagnosis present

## 2013-08-23 DIAGNOSIS — K921 Melena: Secondary | ICD-10-CM

## 2013-08-23 DIAGNOSIS — J9600 Acute respiratory failure, unspecified whether with hypoxia or hypercapnia: Secondary | ICD-10-CM | POA: Diagnosis not present

## 2013-08-23 DIAGNOSIS — N19 Unspecified kidney failure: Secondary | ICD-10-CM

## 2013-08-23 DIAGNOSIS — I509 Heart failure, unspecified: Secondary | ICD-10-CM | POA: Diagnosis present

## 2013-08-23 DIAGNOSIS — K922 Gastrointestinal hemorrhage, unspecified: Secondary | ICD-10-CM

## 2013-08-23 DIAGNOSIS — R112 Nausea with vomiting, unspecified: Secondary | ICD-10-CM | POA: Diagnosis present

## 2013-08-23 DIAGNOSIS — K429 Umbilical hernia without obstruction or gangrene: Secondary | ICD-10-CM | POA: Diagnosis present

## 2013-08-23 DIAGNOSIS — Z951 Presence of aortocoronary bypass graft: Secondary | ICD-10-CM

## 2013-08-23 DIAGNOSIS — I2589 Other forms of chronic ischemic heart disease: Secondary | ICD-10-CM | POA: Diagnosis present

## 2013-08-23 DIAGNOSIS — E1129 Type 2 diabetes mellitus with other diabetic kidney complication: Secondary | ICD-10-CM | POA: Diagnosis present

## 2013-08-23 DIAGNOSIS — C679 Malignant neoplasm of bladder, unspecified: Secondary | ICD-10-CM | POA: Diagnosis present

## 2013-08-23 DIAGNOSIS — IMO0002 Reserved for concepts with insufficient information to code with codable children: Secondary | ICD-10-CM

## 2013-08-23 DIAGNOSIS — I079 Rheumatic tricuspid valve disease, unspecified: Secondary | ICD-10-CM | POA: Diagnosis present

## 2013-08-23 DIAGNOSIS — Z7901 Long term (current) use of anticoagulants: Secondary | ICD-10-CM

## 2013-08-23 DIAGNOSIS — Z79899 Other long term (current) drug therapy: Secondary | ICD-10-CM

## 2013-08-23 DIAGNOSIS — D696 Thrombocytopenia, unspecified: Secondary | ICD-10-CM | POA: Diagnosis not present

## 2013-08-23 DIAGNOSIS — Z9229 Personal history of other drug therapy: Secondary | ICD-10-CM

## 2013-08-23 DIAGNOSIS — I2510 Atherosclerotic heart disease of native coronary artery without angina pectoris: Secondary | ICD-10-CM | POA: Diagnosis present

## 2013-08-23 DIAGNOSIS — I252 Old myocardial infarction: Secondary | ICD-10-CM

## 2013-08-23 DIAGNOSIS — J4489 Other specified chronic obstructive pulmonary disease: Secondary | ICD-10-CM | POA: Diagnosis present

## 2013-08-23 DIAGNOSIS — Z8249 Family history of ischemic heart disease and other diseases of the circulatory system: Secondary | ICD-10-CM

## 2013-08-23 DIAGNOSIS — I5023 Acute on chronic systolic (congestive) heart failure: Secondary | ICD-10-CM | POA: Diagnosis not present

## 2013-08-23 DIAGNOSIS — Z87891 Personal history of nicotine dependence: Secondary | ICD-10-CM

## 2013-08-23 DIAGNOSIS — Z602 Problems related to living alone: Secondary | ICD-10-CM

## 2013-08-23 DIAGNOSIS — N200 Calculus of kidney: Secondary | ICD-10-CM | POA: Diagnosis present

## 2013-08-23 DIAGNOSIS — N400 Enlarged prostate without lower urinary tract symptoms: Secondary | ICD-10-CM | POA: Diagnosis present

## 2013-08-23 DIAGNOSIS — T45515A Adverse effect of anticoagulants, initial encounter: Secondary | ICD-10-CM | POA: Diagnosis present

## 2013-08-23 DIAGNOSIS — I2789 Other specified pulmonary heart diseases: Secondary | ICD-10-CM | POA: Diagnosis present

## 2013-08-23 DIAGNOSIS — E46 Unspecified protein-calorie malnutrition: Secondary | ICD-10-CM | POA: Diagnosis present

## 2013-08-23 DIAGNOSIS — E1165 Type 2 diabetes mellitus with hyperglycemia: Secondary | ICD-10-CM | POA: Diagnosis present

## 2013-08-23 DIAGNOSIS — E1159 Type 2 diabetes mellitus with other circulatory complications: Secondary | ICD-10-CM

## 2013-08-23 DIAGNOSIS — E785 Hyperlipidemia, unspecified: Secondary | ICD-10-CM | POA: Diagnosis present

## 2013-08-23 DIAGNOSIS — T462X5A Adverse effect of other antidysrhythmic drugs, initial encounter: Secondary | ICD-10-CM | POA: Diagnosis present

## 2013-08-23 DIAGNOSIS — I480 Paroxysmal atrial fibrillation: Secondary | ICD-10-CM | POA: Diagnosis present

## 2013-08-23 DIAGNOSIS — K2970 Gastritis, unspecified, without bleeding: Secondary | ICD-10-CM | POA: Diagnosis present

## 2013-08-23 DIAGNOSIS — I130 Hypertensive heart and chronic kidney disease with heart failure and stage 1 through stage 4 chronic kidney disease, or unspecified chronic kidney disease: Secondary | ICD-10-CM | POA: Diagnosis present

## 2013-08-23 DIAGNOSIS — J449 Chronic obstructive pulmonary disease, unspecified: Secondary | ICD-10-CM

## 2013-08-23 DIAGNOSIS — E871 Hypo-osmolality and hyponatremia: Secondary | ICD-10-CM | POA: Diagnosis not present

## 2013-08-23 DIAGNOSIS — I4891 Unspecified atrial fibrillation: Secondary | ICD-10-CM

## 2013-08-23 DIAGNOSIS — Z8673 Personal history of transient ischemic attack (TIA), and cerebral infarction without residual deficits: Secondary | ICD-10-CM

## 2013-08-23 DIAGNOSIS — I798 Other disorders of arteries, arterioles and capillaries in diseases classified elsewhere: Secondary | ICD-10-CM | POA: Diagnosis present

## 2013-08-23 HISTORY — DX: Other cirrhosis of liver: K74.69

## 2013-08-23 HISTORY — DX: Neoplasm of unspecified behavior of bladder: D49.4

## 2013-08-23 HISTORY — DX: Gastrointestinal hemorrhage, unspecified: K92.2

## 2013-08-23 HISTORY — DX: Chronic kidney disease, unspecified: N18.9

## 2013-08-23 LAB — CBC WITH DIFFERENTIAL/PLATELET
BASOS ABS: 0 10*3/uL (ref 0.0–0.1)
BASOS PCT: 0 % (ref 0–1)
Basophils Absolute: 0 10*3/uL (ref 0.0–0.1)
Basophils Relative: 0.2 % (ref 0.0–3.0)
EOS ABS: 0 10*3/uL (ref 0.0–0.7)
EOS ABS: 0.1 10*3/uL (ref 0.0–0.7)
Eosinophils Relative: 0.9 % (ref 0.0–5.0)
Eosinophils Relative: 3 % (ref 0–5)
HCT: 28.9 % — ABNORMAL LOW (ref 39.0–52.0)
HEMATOCRIT: 28.4 % — AB (ref 39.0–52.0)
HEMOGLOBIN: 9.6 g/dL — AB (ref 13.0–17.0)
Hemoglobin: 9.5 g/dL — ABNORMAL LOW (ref 13.0–17.0)
LYMPHS ABS: 0.8 10*3/uL (ref 0.7–4.0)
LYMPHS PCT: 9 % — AB (ref 12.0–46.0)
Lymphocytes Relative: 19 % (ref 12–46)
Lymphs Abs: 0.4 10*3/uL — ABNORMAL LOW (ref 0.7–4.0)
MCH: 29.7 pg (ref 26.0–34.0)
MCHC: 33.3 g/dL (ref 30.0–36.0)
MCHC: 33.5 g/dL (ref 30.0–36.0)
MCV: 88.9 fl (ref 78.0–100.0)
MCV: 88.8 fL (ref 78.0–100.0)
MONO ABS: 0.3 10*3/uL (ref 0.1–1.0)
MONOS PCT: 15.7 % — AB (ref 3.0–12.0)
Monocytes Absolute: 0.7 10*3/uL (ref 0.1–1.0)
Monocytes Relative: 8 % (ref 3–12)
NEUTROS ABS: 3.5 10*3/uL (ref 1.4–7.7)
NEUTROS PCT: 74.2 % (ref 43.0–77.0)
Neutro Abs: 2.8 10*3/uL (ref 1.7–7.7)
Neutrophils Relative %: 70 % (ref 43–77)
Platelets: 123 10*3/uL — ABNORMAL LOW (ref 150.0–400.0)
Platelets: 146 10*3/uL — ABNORMAL LOW (ref 150–400)
RBC: 3.25 Mil/uL — ABNORMAL LOW (ref 4.22–5.81)
RBC: 3.2 MIL/uL — ABNORMAL LOW (ref 4.22–5.81)
RDW: 18.2 % — ABNORMAL HIGH (ref 11.5–14.6)
RDW: 17.4 % — ABNORMAL HIGH (ref 11.5–15.5)
WBC: 4.7 10*3/uL (ref 4.5–10.5)
WBC: 4 10*3/uL (ref 4.0–10.5)

## 2013-08-23 LAB — COMPREHENSIVE METABOLIC PANEL
ALBUMIN: 3.4 g/dL — AB (ref 3.5–5.2)
ALBUMIN: 3.4 g/dL — AB (ref 3.5–5.2)
ALT: 26 U/L (ref 0–53)
ALT: 25 U/L (ref 0–53)
AST: 19 U/L (ref 0–37)
AST: 19 U/L (ref 0–37)
Alkaline Phosphatase: 89 U/L (ref 39–117)
Alkaline Phosphatase: 96 U/L (ref 39–117)
BILIRUBIN TOTAL: 0.6 mg/dL (ref 0.3–1.2)
BUN: 156 mg/dL (ref 6–23)
BUN: 156 mg/dL — AB (ref 6–23)
CALCIUM: 9.1 mg/dL (ref 8.4–10.5)
CHLORIDE: 105 meq/L (ref 96–112)
CHLORIDE: 100 meq/L (ref 96–112)
CO2: 18 meq/L — AB (ref 19–32)
CO2: 15 mEq/L — ABNORMAL LOW (ref 19–32)
CREATININE: 5.9 mg/dL — AB (ref 0.4–1.5)
CREATININE: 5.22 mg/dL — AB (ref 0.50–1.35)
Calcium: 9.3 mg/dL (ref 8.4–10.5)
GFR calc Af Amer: 12 mL/min — ABNORMAL LOW (ref >90)
GFR calc non Af Amer: 10 mL/min — ABNORMAL LOW (ref >90)
GFR: 10.02 mL/min — AB (ref >60.00)
Glucose, Bld: 150 mg/dL — ABNORMAL HIGH (ref 70–99)
Glucose, Bld: 154 mg/dL — ABNORMAL HIGH (ref 70–99)
POTASSIUM: 6.7 meq/L — AB (ref 3.5–5.1)
Potassium: 6.3 mEq/L — ABNORMAL HIGH (ref 3.7–5.3)
Sodium: 134 mEq/L — ABNORMAL LOW (ref 135–145)
Sodium: 136 mEq/L — ABNORMAL LOW (ref 137–147)
TOTAL PROTEIN: 7.1 g/dL (ref 6.0–8.3)
Total Bilirubin: 0.9 mg/dL (ref 0.3–1.2)
Total Protein: 6.9 g/dL (ref 6.0–8.3)

## 2013-08-23 LAB — LIPASE, BLOOD: Lipase: 71 U/L — ABNORMAL HIGH (ref 11–59)

## 2013-08-23 LAB — BASIC METABOLIC PANEL
BUN: 148 mg/dL — ABNORMAL HIGH (ref 6–23)
CALCIUM: 9.9 mg/dL (ref 8.4–10.5)
CO2: 16 meq/L — AB (ref 19–32)
Chloride: 103 mEq/L (ref 96–112)
Creatinine, Ser: 4.96 mg/dL — ABNORMAL HIGH (ref 0.50–1.35)
GFR calc Af Amer: 12 mL/min — ABNORMAL LOW (ref >90)
GFR calc non Af Amer: 11 mL/min — ABNORMAL LOW (ref >90)
Glucose, Bld: 100 mg/dL — ABNORMAL HIGH (ref 70–99)
Potassium: 5.8 mEq/L — ABNORMAL HIGH (ref 3.7–5.3)
SODIUM: 141 meq/L (ref 137–147)

## 2013-08-23 LAB — CREATININE, URINE, RANDOM: CREATININE, URINE: 28.65 mg/dL

## 2013-08-23 LAB — URINALYSIS, ROUTINE W REFLEX MICROSCOPIC
BILIRUBIN URINE: NEGATIVE
GLUCOSE, UA: NEGATIVE mg/dL
Hgb urine dipstick: NEGATIVE
KETONES UR: NEGATIVE mg/dL
Leukocytes, UA: NEGATIVE
Nitrite: NEGATIVE
PH: 5 (ref 5.0–8.0)
Protein, ur: NEGATIVE mg/dL
Specific Gravity, Urine: 1.01 (ref 1.005–1.030)
Urobilinogen, UA: 0.2 mg/dL (ref 0.0–1.0)

## 2013-08-23 LAB — PROTIME-INR
INR: 4.01 — ABNORMAL HIGH (ref 0.00–1.49)
PROTHROMBIN TIME: 37.5 s — AB (ref 11.6–15.2)

## 2013-08-23 LAB — AMMONIA: Ammonia: 45 umol/L (ref 11–60)

## 2013-08-23 LAB — HEMOGLOBIN AND HEMATOCRIT, BLOOD
HEMATOCRIT: 29.2 % — AB (ref 39.0–52.0)
HEMOGLOBIN: 9.9 g/dL — AB (ref 13.0–17.0)

## 2013-08-23 LAB — PRO B NATRIURETIC PEPTIDE: Pro B Natriuretic peptide (BNP): 2953 pg/mL — ABNORMAL HIGH (ref 0–125)

## 2013-08-23 LAB — GLUCOSE, CAPILLARY: GLUCOSE-CAPILLARY: 106 mg/dL — AB (ref 70–99)

## 2013-08-23 LAB — CG4 I-STAT (LACTIC ACID): Lactic Acid, Venous: 0.79 mmol/L (ref 0.5–2.2)

## 2013-08-23 LAB — ABO/RH: ABO/RH(D): O POS

## 2013-08-23 MED ORDER — PANTOPRAZOLE SODIUM 40 MG IV SOLR
40.0000 mg | Freq: Two times a day (BID) | INTRAVENOUS | Status: DC
  Administered 2013-08-23 – 2013-08-27 (×7): 40 mg via INTRAVENOUS
  Filled 2013-08-23 (×10): qty 40

## 2013-08-23 MED ORDER — SODIUM BICARBONATE 8.4 % IV SOLN
50.0000 meq | Freq: Once | INTRAVENOUS | Status: AC
  Administered 2013-08-23: 50 meq via INTRAVENOUS
  Filled 2013-08-23: qty 50

## 2013-08-23 MED ORDER — CARVEDILOL 6.25 MG PO TABS
6.2500 mg | ORAL_TABLET | Freq: Two times a day (BID) | ORAL | Status: DC
  Filled 2013-08-23 (×4): qty 1

## 2013-08-23 MED ORDER — AMIODARONE HCL 200 MG PO TABS
200.0000 mg | ORAL_TABLET | Freq: Every morning | ORAL | Status: DC
  Administered 2013-08-26 – 2013-08-27 (×2): 200 mg via ORAL
  Filled 2013-08-23 (×4): qty 1

## 2013-08-23 MED ORDER — ATORVASTATIN CALCIUM 20 MG PO TABS
20.0000 mg | ORAL_TABLET | Freq: Every day | ORAL | Status: DC
  Administered 2013-08-23 – 2013-08-26 (×4): 20 mg via ORAL
  Filled 2013-08-23 (×5): qty 1

## 2013-08-23 MED ORDER — SODIUM CHLORIDE 0.9 % IV SOLN
INTRAVENOUS | Status: DC
  Administered 2013-08-23: 16:00:00 via INTRAVENOUS

## 2013-08-23 MED ORDER — FUROSEMIDE 10 MG/ML IJ SOLN
40.0000 mg | Freq: Four times a day (QID) | INTRAMUSCULAR | Status: DC
  Administered 2013-08-23 – 2013-08-24 (×2): 40 mg via INTRAVENOUS
  Filled 2013-08-23 (×5): qty 4

## 2013-08-23 MED ORDER — ONDANSETRON HCL 4 MG/2ML IJ SOLN: 4.0000 mg | Freq: Four times a day (QID) | INTRAMUSCULAR | Status: DC | PRN

## 2013-08-23 MED ORDER — INSULIN ASPART 100 UNIT/ML ~~LOC~~ SOLN
0.0000 [IU] | SUBCUTANEOUS | Status: DC
  Administered 2013-08-24 (×3): 3 [IU] via SUBCUTANEOUS
  Administered 2013-08-25 (×2): 5 [IU] via SUBCUTANEOUS
  Administered 2013-08-26: 2 [IU] via SUBCUTANEOUS
  Administered 2013-08-26: 5 [IU] via SUBCUTANEOUS

## 2013-08-23 MED ORDER — ISOSORBIDE MONONITRATE ER 30 MG PO TB24
30.0000 mg | ORAL_TABLET | Freq: Every day | ORAL | Status: DC
  Administered 2013-08-24: 30 mg via ORAL
  Filled 2013-08-23 (×2): qty 1

## 2013-08-23 MED ORDER — SODIUM POLYSTYRENE SULFONATE 15 GM/60ML PO SUSP
60.0000 g | Freq: Once | ORAL | Status: AC
  Administered 2013-08-23: 60 g via ORAL
  Filled 2013-08-23: qty 240

## 2013-08-23 MED ORDER — SODIUM CHLORIDE 0.9 % IV SOLN
1.0000 g | INTRAVENOUS | Status: AC
  Administered 2013-08-23: 1 g via INTRAVENOUS
  Filled 2013-08-23: qty 10

## 2013-08-23 MED ORDER — SODIUM BICARBONATE 650 MG PO TABS
650.0000 mg | ORAL_TABLET | Freq: Two times a day (BID) | ORAL | Status: DC
  Administered 2013-08-23 – 2013-08-27 (×6): 650 mg via ORAL
  Filled 2013-08-23 (×9): qty 1

## 2013-08-23 MED ORDER — SODIUM CHLORIDE 0.9 % IV SOLN
8.0000 mg/h | INTRAVENOUS | Status: DC
  Filled 2013-08-23 (×2): qty 80

## 2013-08-23 MED ORDER — VITAMIN K1 10 MG/ML IJ SOLN
10.0000 mg | Freq: Once | INTRAVENOUS | Status: AC
  Administered 2013-08-23: 10 mg via INTRAVENOUS
  Filled 2013-08-23: qty 1

## 2013-08-23 MED ORDER — SODIUM CHLORIDE 0.9 % IV SOLN
80.0000 mg | Freq: Once | INTRAVENOUS | Status: DC
  Filled 2013-08-23: qty 80

## 2013-08-23 NOTE — ED Notes (Signed)
NOTIFIED DR. ZACOSKI IN PERSON OF PATIENTS LAB RESULTS OF CG4+ LACTIC ACID ,08/23/2013.

## 2013-08-23 NOTE — ED Provider Notes (Addendum)
CSN: [id number]     Arrival date & time 08/23/13  1505 History   First MD Initiated Contact with Patient 08/23/13 1507     Chief Complaint  Patient presents with  . Chronic Kidney Disease   (Consider location/radiation/quality/duration/timing/severity/associated sxs/prior Treatment) The history is provided by the patient and the EMS personnel.   73 year old male brought in by EMS. Patient's primary care Dr. is Nevis. Patient had blood work done yesterday results reported today had marked elevation of potassium and other lab abnormalities. Primary care physician's office called EMS to pick him up from home he was at his sister's house. Patient describes abdominal distention for the past 2 weeks with some vomiting nausea and some diarrhea. States that at the doctor's office yesterday he had blood in the stool. Patient is on Coumadin for atrial fib. Patient states he has not felt well for 2 weeks. Patient states his stools are tarry today.  Past Medical History  Diagnosis Date  . Atrial fibrillation      S/P ablation  . CAD (coronary artery disease)     S/P MI 1980's----------------------Dr McDowell  . Hyperlipidemia   . CHF (congestive heart failure)   . 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 BPH (benign prostatic hypertrophy)   . Stroke   . Sleep disturbance    Past Surgical History  Procedure Laterality Date  . Coronary artery bypass graft    . Appendectomy    . Tonsillectomy    . Upper gastrointestinal endoscopy      Gastritis , ? H. pylori 08/28/1999  . Atrial ablation surgery     Family History  Problem Relation Age of Onset  . Parkinson's disease Mother   . Heart disease Father   . Skin cancer Sister   . Heart attack Mother    History  Substance Use Topics  . Smoking status: Former Smoker -- 1.00 packs/day    Types: Cigarettes    Quit date: 07/15/1973  . Smokeless tobacco: Never Used  . Alcohol Use: No    Review of Systems   Constitutional: Positive for fatigue. Negative for fever.  HENT: Negative for congestion.   Eyes: Positive for redness.  Respiratory: Negative for shortness of breath.   Cardiovascular: Negative for chest pain.  Gastrointestinal: Positive for nausea, vomiting, diarrhea, blood in stool and abdominal distention.  Genitourinary: Negative for dysuria.  Musculoskeletal: Negative for back pain.  Skin: Negative for wound.  Neurological: Negative for headaches.  Hematological: Bruises/bleeds easily.  Psychiatric/Behavioral: Negative for confusion.    Allergies  Latex  Home Medications   Current Outpatient Rx  Name  Route  Sig  Dispense  Refill  . acetaminophen (TYLENOL) 500 MG tablet   Oral   Take 500 mg by mouth every 6 (six) hours as needed for moderate pain.         Marland Kitchen albuterol (PROVENTIL) (5 MG/ML) 0.5% nebulizer solution   Nebulization   Take 0.5 mLs (2.5 mg total) by nebulization every 6 (six) hours as needed for wheezing or shortness of breath.   150 mL   1   . amiodarone (PACERONE) 200 MG tablet   Oral   Take 1 tablet (200 mg total) by mouth every morning.   30 tablet   3   . atorvastatin (LIPITOR) 20 MG tablet   Oral   Take 20 mg by mouth at bedtime.          . carvedilol (COREG) 6.25 MG tablet  Oral   Take 1 tablet (6.25 mg total) by mouth 2 (two) times daily with a meal.   60 tablet   5   . Cholecalciferol (VITAMIN D) 1000 UNITS capsule   Oral   Take 1,000 Units by mouth every morning.         . docusate sodium (COLACE) 100 MG capsule   Oral   Take 100 mg by mouth 2 (two) times daily.         . insulin detemir (LEVEMIR) 100 UNIT/ML injection   Subcutaneous   Inject 15 Units into the skin at bedtime.         . isosorbide mononitrate (IMDUR) 30 MG 24 hr tablet   Oral   Take 1 tablet (30 mg total) by mouth at bedtime.   30 tablet   6   . LORazepam (ATIVAN) 0.5 MG tablet   Oral   Take 0.5-1 mg by mouth at bedtime as needed (for sleep).           . metolazone (ZAROXOLYN) 2.5 MG tablet   Oral   Take 2.5 mg by mouth 2 (two) times a week. Do not take if your weight is under 160# at home. Usually takes tuesdays and fridays         . Multiple Vitamin (MULTIVITAMIN WITH MINERALS) TABS tablet   Oral   Take 1 tablet by mouth daily.         . Multiple Vitamin (MULTIVITAMIN WITH MINERALS) TABS   Oral   Take 1 tablet by mouth daily.         Marland Kitchen oxyCODONE (OXY IR/ROXICODONE) 5 MG immediate release tablet   Oral   Take 1 tablet (5 mg total) by mouth every 6 (six) hours as needed for moderate pain.   20 tablet   0   . promethazine (PHENERGAN) 25 MG tablet   Oral   Take 1 tablet (25 mg total) by mouth 3 (three) times daily as needed for nausea or vomiting.   6 tablet   0   . spironolactone (ALDACTONE) 25 MG tablet   Oral   Take 100 mg by mouth daily.          Marland Kitchen torsemide (DEMADEX) 20 MG tablet   Oral   Take 2 tablets (40 mg total) by mouth 2 (two) times daily. Hold afternoon dose if your weight at home is under 160#   120 tablet   11   . warfarin (COUMADIN) 5 MG tablet   Oral   Take 5 mg by mouth at bedtime.          BP 84/28  Pulse 56  Temp(Src) 97.3 F (36.3 C) (Oral)  Resp 21  SpO2 97% Physical Exam  Nursing note and vitals reviewed. Constitutional: He is oriented to person, place, and time. He appears well-developed and well-nourished. He appears distressed.  HENT:  Head: Normocephalic and atraumatic.  Eyes: Conjunctivae and EOM are normal. Pupils are equal, round, and reactive to light.  Neck: Normal range of motion.  Cardiovascular: Regular rhythm.   Bradycardic  Pulmonary/Chest: Effort normal. He has rales.  Abdominal: Soft. He exhibits distension. There is no tenderness.  Musculoskeletal: Normal range of motion. He exhibits edema.  Mild lower trimming the edema lower trimming these are graded color. Refill to both big toes is very brisk one second.  Neurological: He is alert and oriented to  person, place, and time. No cranial nerve deficit. He exhibits normal muscle tone. Coordination normal.  Skin:  Skin is warm. There is pallor.    ED Course  Procedures (including critical care time) Labs Review Labs Reviewed  CBC WITH DIFFERENTIAL - Abnormal; Notable for the following:    RBC 3.20 (*)    Hemoglobin 9.5 (*)    HCT 28.4 (*)    RDW 17.4 (*)    Platelets 146 (*)    All other components within normal limits  COMPREHENSIVE METABOLIC PANEL - Abnormal; Notable for the following:    Sodium 136 (*)    Potassium 6.3 (*)    CO2 15 (*)    Glucose, Bld 154 (*)    BUN 156 (*)    Creatinine, Ser 5.22 (*)    Albumin 3.4 (*)    GFR calc non Af Amer 10 (*)    GFR calc Af Amer 12 (*)    All other components within normal limits  PROTIME-INR - Abnormal; Notable for the following:    Prothrombin Time 37.5 (*)    INR 4.01 (*)    All other components within normal limits  PRO B NATRIURETIC PEPTIDE - Abnormal; Notable for the following:    Pro B Natriuretic peptide (BNP) 2953.0 (*)    All other components within normal limits  LIPASE, BLOOD - Abnormal; Notable for the following:    Lipase 71 (*)    All other components within normal limits  URINALYSIS, ROUTINE W REFLEX MICROSCOPIC  AMMONIA  CG4 I-STAT (LACTIC ACID)   Results for orders placed during the hospital encounter of 08/23/13  CBC WITH DIFFERENTIAL      Result Value Range   WBC 4.0  4.0 - 10.5 K/uL   RBC 3.20 (*) 4.22 - 5.81 MIL/uL   Hemoglobin 9.5 (*) 13.0 - 17.0 g/dL   HCT 28.4 (*) 39.0 - 52.0 %   MCV 88.8  78.0 - 100.0 fL   MCH 29.7  26.0 - 34.0 pg   MCHC 33.5  30.0 - 36.0 g/dL   RDW 17.4 (*) 11.5 - 15.5 %   Platelets 146 (*) 150 - 400 K/uL   Neutrophils Relative % 70  43 - 77 %   Neutro Abs 2.8  1.7 - 7.7 K/uL   Lymphocytes Relative 19  12 - 46 %   Lymphs Abs 0.8  0.7 - 4.0 K/uL   Monocytes Relative 8  3 - 12 %   Monocytes Absolute 0.3  0.1 - 1.0 K/uL   Eosinophils Relative 3  0 - 5 %   Eosinophils  Absolute 0.1  0.0 - 0.7 K/uL   Basophils Relative 0  0 - 1 %   Basophils Absolute 0.0  0.0 - 0.1 K/uL  COMPREHENSIVE METABOLIC PANEL      Result Value Range   Sodium 136 (*) 137 - 147 mEq/L   Potassium 6.3 (*) 3.7 - 5.3 mEq/L   Chloride 100  96 - 112 mEq/L   CO2 15 (*) 19 - 32 mEq/L   Glucose, Bld 154 (*) 70 - 99 mg/dL   BUN 156 (*) 6 - 23 mg/dL   Creatinine, Ser 5.22 (*) 0.50 - 1.35 mg/dL   Calcium 9.3  8.4 - 10.5 mg/dL   Total Protein 7.1  6.0 - 8.3 g/dL   Albumin 3.4 (*) 3.5 - 5.2 g/dL   AST 19  0 - 37 U/L   ALT 25  0 - 53 U/L   Alkaline Phosphatase 96  39 - 117 U/L   Total Bilirubin 0.6  0.3 - 1.2 mg/dL  GFR calc non Af Amer 10 (*) >90 mL/min   GFR calc Af Amer 12 (*) >90 mL/min  PROTIME-INR      Result Value Range   Prothrombin Time 37.5 (*) 11.6 - 15.2 seconds   INR 4.01 (*) 0.00 - 1.49  PRO B NATRIURETIC PEPTIDE      Result Value Range   Pro B Natriuretic peptide (BNP) 2953.0 (*) 0 - 125 pg/mL  LIPASE, BLOOD      Result Value Range   Lipase 71 (*) 11 - 59 U/L  URINALYSIS, ROUTINE W REFLEX MICROSCOPIC      Result Value Range   Color, Urine YELLOW  YELLOW   APPearance CLEAR  CLEAR   Specific Gravity, Urine 1.010  1.005 - 1.030   pH 5.0  5.0 - 8.0   Glucose, UA NEGATIVE  NEGATIVE mg/dL   Hgb urine dipstick NEGATIVE  NEGATIVE   Bilirubin Urine NEGATIVE  NEGATIVE   Ketones, ur NEGATIVE  NEGATIVE mg/dL   Protein, ur NEGATIVE  NEGATIVE mg/dL   Urobilinogen, UA 0.2  0.0 - 1.0 mg/dL   Nitrite NEGATIVE  NEGATIVE   Leukocytes, UA NEGATIVE  NEGATIVE  CG4 I-STAT (LACTIC ACID)      Result Value Range   Lactic Acid, Venous 0.79  0.5 - 2.2 mmol/L    Imaging Review Dg Chest Port 1 View  08/23/2013   CLINICAL DATA:  Shortness of breath  EXAM: PORTABLE CHEST - 1 VIEW  COMPARISON:  Prior radiograph from 07/29/2013  FINDINGS: Median sternotomy wires with underlying CABG markers and surgical clips again seen. Cardiomegaly is stable.  Lungs are normally inflated. No focal  infiltrate to suggest an acute infectious pneumonitis identified. Overall pulmonary vascularity is mildly increased without overt pulmonary edema. Minimal blunting of the right costophrenic angle is suggestive of a small right pleural effusion. No pneumothorax.  Osseous structures are unchanged.  IMPRESSION: 1. Stable cardiomegaly with mild diffuse pulmonary vascular congestion without overt pulmonary edema. No focal infiltrates identified. 2. Small right pleural effusion.   Electronically Signed   By: [name] M.D.   On: 08/23/2013 16:12    EKG Interpretation    Date/Time:  Tuesday August 23 2013 15:43:55 EST Ventricular Rate:  57 PR Interval:  161 QRS Duration: 178 QT Interval:  563 QTC Calculation: 548 R Axis:   -69 Text Interpretation:  Sinus or ectopic atrial rhythm Nonspecific IVCD with LAD Anterolateral infarct, old some worsening of QRS duration but otherwise No significant change was found Confirmed by Markiah Janeway  MD, Imogine Carvell ([id number]) on 08/23/2013 3:55:07 PM Also confirmed by Rogene Houston  MD, [place] ([id number])  on 08/23/2013 4:29:38 PM           CRITICAL CARE Performed by: Mervin Kung. Total critical care time: 45 Critical care time was exclusive of separately billable procedures and treating other patients. Critical care was necessary to treat or prevent imminent or life-threatening deterioration. Critical care was time spent personally by me on the following activities: development of treatment plan with patient and/or surrogate as well as nursing, discussions with consultants, evaluation of patient's response to treatment, examination of patient, obtaining history from patient or surrogate, ordering and performing treatments and interventions, ordering and review of laboratory studies, ordering and review of radiographic studies, pulse oximetry and re-evaluation of patient's condition.   MDM   1. Hyperkalemia   2. Renal failure   3. GI bleed      Patient blood  pressure improved with a bicarbonate up to 99 systolic.  Was 31 prior to that. Patient has had some periods of hypotension. Lowest blood pressure was in the ED his. Heart rate is always been bradycardic. Patient after receiving an amp of bicarbonate and starting a calcium gluconate blood pressures improved significantly up to 99. Patient with other concerning factors patient is on Coumadin for A. fib due to the renal failure the INR is in the 4 range. Patient was identified having blood in his stools yesterday by his primary care Dr. Now having tarry stools. Patient may end up requiring fresh frozen plasma. In addition patient has abdominal distention which clinically seems to be ascites about been present for 2 weeks. Patient's immediate concern was obviously the elevated potassium of 6.3 with a marked acidosis hence the reason for the bicarbonate and calcium gluconate. Chest x-ray shows some mild mild CHF the patient's known to have CHF. BNP is elevated from baseline for him. Patient's liver function test are without any sniffing or mallet a mild elevation of lipase. Patient's urinalysis is negative for urinary tract infection his point-of-care lactic acid is not consistent with sepsis. Suspect the acidosis is tied into uremia. Initially discussed with triad of borderline step down versus ICU in discussion with them most likely ICU discussed with pulmonary critical care they will see. Consult out to nephrology is still pending.  Main concern is the hyperkalemia and the acidosis that may be contributing to the hypotension seems to be since he is responding to the treatments for that. Other concerns include the GI bleed and a marked elevation of the INR. Fluid management on him this can be challenging due to the CHF.  Patient is still making urine on his own without any particular problems. Patient initially was pale in color but now of skin color is nice and pink.  Clarify his CODE STATUS with his brother who  is the executor of his will. Patient is not a DO NOT RESUSCITATE however does not want any long-term life support measures. He would like to have everything done possible in the short run.  Mervin Kung, MD 08/23/13 1757  Addendum: Patient's blood pressure remaining improve systolic is around 98. Patient currently in no acute distress. Repeat H&H will be ordered.  Mervin Kung, MD 08/23/13 [phone]

## 2013-08-23 NOTE — Telephone Encounter (Signed)
Called patient He hasn't changed and sounds okay  I asked him to head right to [facility] ER due to acute renal failure (I suspect obstruction) He will get a family member to drive him Triage notified in the [place], Please cancel the ultrasound for tomorrow as I am sure he will be admitted

## 2013-08-23 NOTE — ED Notes (Signed)
Per EMS, pt. Here for evaluation of acute kidney failure. States it has been getting worse the past couple weeks but yesterday had blood test at PCP and they told him to come here.

## 2013-08-23 NOTE — H&P (Signed)
PULMONARY / CRITICAL CARE MEDICINE  Name: [name] MRN: [id number] DOB: 16-Oct-1940    ADMISSION DATE:  08/23/2013 CONSULTATION DATE:  08/23/2013  REFERRING MD :  EDP PRIMARY SERVICE: PCCM  CHIEF COMPLAINT:  Blood in stools  BRIEF PATIENT DESCRIPTION: 73 yo with CKD and CHF (EF 25-30%), AF on Coumadin, presenting 1/20 with abd distension, blood in stools, hyperkalemia, and AKI.  SIGNIFICANT EVENTS / STUDIES:  1/20  Admitted with suspected upper GI hemoorhage, AKI, and hyperkalemia  LINES / TUBES:  CULTURES:  ANTIBIOTICS:  HISTORY OF PRESENT ILLNESS:   The patient is a 73 yo man, history of CHF (EF25-30%), afib on coumadin, DM, prior CVA, CAD, recently diagnosed bladder tumor, presenting with blood in stools.  The patient notes occasional dark, tarry stools for the last 2-3 weeks, with 1 episode of BRB yesterday.  Around this time, he also notes increased heartburn, epigastric pain, nausea, vomiting x2 (non-bloody, though once after eating red soup), generalized weakness/decreased exercise tolerance, and increasing abdominal distention, with a 10-lb weight gain.  The patient notes no alcohol or NSAID usage in the last few weeks, and no history of UGI bleed; INR = 4 on admission.  The patient notes continued diuretic usage of torsemide, spironolactone, and metolazone during this time, with decreased PO intake, and was found on admission to have AKI and hyperkalemia.  The patient's BP has been in the SBP 90-100's since admission, which per chart review is around the patient's baseline, with a normal lactic acid level.  PAST MEDICAL HISTORY :  Past Medical History  Diagnosis Date  . Atrial fibrillation      S/P ablation  . CAD (coronary artery disease)     S/P MI 1980's----------------------Dr McDowell  . Hyperlipidemia   . CHF (congestive heart failure)   . 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 BPH (benign prostatic hypertrophy)   . Stroke   .  Sleep disturbance    Past Surgical History  Procedure Laterality Date  . Coronary artery bypass graft    . Appendectomy    . Tonsillectomy    . Upper gastrointestinal endoscopy      Gastritis , ? H. pylori 08/28/1999  . Atrial ablation surgery     Prior to Admission medications   Medication Sig Start Date End Date Taking? Authorizing Provider  acetaminophen (TYLENOL) 500 MG tablet Take 500 mg by mouth every 6 (six) hours as needed for moderate pain.   Yes Historical Provider, MD  albuterol (PROVENTIL) (5 MG/ML) 0.5% nebulizer solution Take 0.5 mLs (2.5 mg total) by nebulization every 6 (six) hours as needed for wheezing or shortness of breath. 06/28/13  Yes Venia Carbon, MD  amiodarone (PACERONE) 200 MG tablet Take 1 tablet (200 mg total) by mouth every morning. 08/08/13  Yes Minna Merritts, MD  atorvastatin (LIPITOR) 20 MG tablet Take 20 mg by mouth at bedtime.    Yes Historical Provider, MD  carvedilol (COREG) 6.25 MG tablet Take 1 tablet (6.25 mg total) by mouth 2 (two) times daily with a meal. 06/09/13  Yes Minna Merritts, MD  Cholecalciferol (VITAMIN D) 1000 UNITS capsule Take 1,000 Units by mouth every morning.   Yes Historical Provider, MD  docusate sodium (COLACE) 100 MG capsule Take 100 mg by mouth 2 (two) times daily.   Yes Historical Provider, MD  insulin detemir (LEVEMIR) 100 UNIT/ML injection Inject 15 Units into the skin at bedtime. 06/28/13  Yes Venia Carbon,  MD  isosorbide mononitrate (IMDUR) 30 MG 24 hr tablet Take 1 tablet (30 mg total) by mouth at bedtime. 07/11/13  Yes Minna Merritts, MD  LORazepam (ATIVAN) 0.5 MG tablet Take 0.5-1 mg by mouth at bedtime as needed (for sleep).    Yes Historical Provider, MD  metolazone (ZAROXOLYN) 2.5 MG tablet Take 2.5 mg by mouth 2 (two) times a week. Do not take if your weight is under 160# at home. Usually takes tuesdays and fridays 07/11/13  Yes Venia Carbon, MD  Multiple Vitamin (MULTIVITAMIN WITH MINERALS) TABS tablet  Take 1 tablet by mouth daily.   Yes Historical Provider, MD  Multiple Vitamin (MULTIVITAMIN WITH MINERALS) TABS Take 1 tablet by mouth daily.   Yes Historical Provider, MD  oxyCODONE (OXY IR/ROXICODONE) 5 MG immediate release tablet Take 1 tablet (5 mg total) by mouth every 6 (six) hours as needed for moderate pain. 06/08/13  Yes Nishant Dhungel, MD  promethazine (PHENERGAN) 25 MG tablet Take 1 tablet (25 mg total) by mouth 3 (three) times daily as needed for nausea or vomiting. 07/27/13  Yes Venia Carbon, MD  spironolactone (ALDACTONE) 25 MG tablet Take 100 mg by mouth daily.  06/09/13  Yes Historical Provider, MD  torsemide (DEMADEX) 20 MG tablet Take 2 tablets (40 mg total) by mouth 2 (two) times daily. Hold afternoon dose if your weight at home is under 160# 05/05/13  Yes Venia Carbon, MD  warfarin (COUMADIN) 5 MG tablet Take 5 mg by mouth at bedtime.   Yes Historical Provider, MD   Allergies  Allergen Reactions  . Latex Other (See Comments)    Very thin skin, pulls off as removing bandaids    FAMILY HISTORY:  Family History  Problem Relation Age of Onset  . Parkinson's disease Mother   . Heart disease Father   . Skin cancer Sister   . Heart attack Mother    SOCIAL HISTORY:  reports that he quit smoking about 40 years ago. His smoking use included Cigarettes. He smoked 1.00 pack per day. He has never used smokeless tobacco. He reports that he does not drink alcohol or use illicit drugs.  REVIEW OF SYSTEMS:   General: no fevers, chills, changes in appetite Skin: no rash HEENT: no blurry vision, hearing changes, sore throat Pulm: no coughing, wheezing CV: no chest pain, palpitations Abd: see HPI GU: +dysuria x2 weeks, no hematuria, polyuria Ext: no arthralgias, myalgias Neuro: no weakness, numbness, or tingling  SUBJECTIVE:   VITAL SIGNS: Temp:  [97.3 F (36.3 C)] 97.3 F (36.3 C) (01/20 1511) Pulse Rate:  [55-63] 63 (01/20 1800) Resp:  [13-21] 14 (01/20 1800) BP:  (78-107)/(28-60) 107/50 mmHg (01/20 1800) SpO2:  [96 %-100 %] 99 % (01/20 1800)  HEMODYNAMICS:   VENTILATOR SETTINGS:   INTAKE / OUTPUT: Intake/Output   None    PHYSICAL EXAMINATION: General: alert, cooperative, appears mildly uncomfortable HEENT: pupils equal round and reactive to light, vision grossly intact, oropharynx clear and non-erythematous, mucous membranes moist Neck: supple Lungs: mildly increased work of respiration with shallow breaths, minimal crackles in LLL Heart: Bradycardic, regular rhythm, grade III/VI systolic murmur Abdomen: soft, mildly tender to epigastric palpation, quite distended, normoactive bowel sounds, no guarding or rebound tenderness Extremities: 1+ pitting edema bilaterally Neurologic: alert & oriented X3, cranial nerves II-XII intact, strength grossly intact, sensation intact to light touch   LABS:  CBC  Recent Labs Lab 08/23/13 0828 08/23/13 1534  WBC 4.7 4.0  HGB 9.6* 9.5*  HCT  28.9* 28.4*  PLT 123.0* 146*   Coag's  Recent Labs Lab 08/18/13 1549 08/23/13 1534  INR 2.2 4.01*   BMET  Recent Labs Lab 08/23/13 0914 08/23/13 1534  NA 134* 136*  K 6.7* 6.3*  CL 105 100  CO2 18* 15*  BUN 156* 156*  CREATININE 5.9* 5.22*  GLUCOSE 150* 154*   Electrolytes  Recent Labs Lab 08/23/13 0914 08/23/13 1534  CALCIUM 9.1 9.3   Sepsis Markers  Recent Labs Lab 08/23/13 1611  LATICACIDVEN 0.79   ABG No results found for this basename: PHART, PCO2ART, PO2ART,  in the last 168 hours  Liver Enzymes  Recent Labs Lab 08/23/13 0914 08/23/13 1534  AST 19 19  ALT 26 25  ALKPHOS 89 96  BILITOT 0.9 0.6  ALBUMIN 3.4* 3.4*   Cardiac Enzymes  Recent Labs Lab 08/23/13 1534  PROBNP 2953.0*   Glucose No results found for this basename: GLUCAP,  in the last 168 hours  CXR: 1/20 >>> Mild pulmonary vascular congestion, no overt airspace disease  ASSESSMENT / PLAN:  PULMONARY A:  Shortness of breath likely to abdominal  distension,  less likely to mild pulmonary edema Pulmonary hypertension likely secondary to heart disease P:   -keep O2 sats > 92%, can use oxygen prn  CARDIOVASCULAR A:  Acute on chronic systolic CHF Baseline hypotension  H/o AF, now sinus bradycardia  P:  -Goal SBP>90 -Continue Amiodarone, Coreg, Lipitor, Imdur -ACEI contraindicated as AKI / hyperkaleamia -TTE  RENAL A:  Acute on cronic renal failure (baseline Cr 1.5) Cardiorenal syndrome (clinically hypervolemic) vs prerenal (decreased PO intake, bleeding, continued diuretic use) vs  post-renal (recently diagnosed bladder tumor) Bladder tumor (discovered 1 month ago, no treatment yet) Hyperkalemia  (likely 2/2 AKI, spironolactone use) Elevated AG acidosis  (likely 2/2 uremia. Lactic acid normal, CBG normal, no aspirin use) P:   -request Renal consult -[place] to evaluate for hydronephrosis -check FeUrea -monitor I/O's -hold Metolazone, Spironolactone, demadex -start Lasix IV 40 mg q6h x 48 hours -bicarbonate ( given 2 amps in ED, will start PO supplementation) -received Ca in ED -give Kayexalate x1 now, repeat BMET this evening -trend BMP -my need urgent HD  GASTROINTESTINAL A:   Subacute upper GI hemorrhage (baseline Hb = 11-12) P:   -request GI consult for possible EGD (doubt needs one tonight) -protonix 40 bid -NPO  HEMATOLOGIC A:   Acute / subacute blood loss anemia secondary to GI hemorrhage Coumadin coagulopathy ( indication AF ) P:  -trend CBC -hold Coumadin -Vit K 10 -would not transfuse PRBC / FFP at this  INFECTIOUS A:   No acute issues P:   No intervention required  ENDOCRINE A:   DM type 2 P:   SSI Hold Levemir  NEUROLOGIC A:   No acute issues P:   Hold Ativan / Oxycodone   Elnora Morrison, PGY3 Pgr. [phone]  08/23/2013, 6:50 PM  I have personally obtained history, examined patient, evaluated and interpreted laboratory and imaging results, reviewed medical records, formulated  assessment / plan and placed orders.  CRITICAL CARE:  The patient is critically ill with multiple organ systems failure and requires high complexity decision making for assessment and support, frequent evaluation and titration of therapies, application of advanced monitoring technologies and extensive interpretation of multiple databases. Critical Care Time devoted to patient care services described in this note is 40 minutes.   Doree Fudge, MD Pulmonary and [place] Pager: [phone]  08/23/2013, 8:02 PM

## 2013-08-23 NOTE — Telephone Encounter (Signed)
[facility] at [facility] lab called critical results, K+ 6.7, BUN 156, Crt 5.93, GFR 10.01, results given to Dr Silvio Pate

## 2013-08-23 NOTE — ED Notes (Signed)
Critical Care physician at bedside

## 2013-08-23 NOTE — Addendum Note (Signed)
Addended by: Ellamae Sia on: 08/23/2013 09:14 AM   Modules accepted: Orders

## 2013-08-23 NOTE — Telephone Encounter (Signed)
Left message with results on VM, advised to call if any questions 

## 2013-08-23 NOTE — Telephone Encounter (Signed)
US cancelled  

## 2013-08-23 NOTE — ED Notes (Signed)
Portable xray at bedside.

## 2013-08-23 NOTE — ED Notes (Signed)
Pt. Reports kidneys failing over the past x3 weeks. States abdomen has become more distended and increase in nausea with decrease in urine. Pt. PCP placed on lasix and "another drug to help get the fluid off", but pt reports increase in discomfort. Reports PCP called today with labwork and told to come to ER. Pt. Abdomen distended and firm, bowel sounds not audible.

## 2013-08-24 ENCOUNTER — Inpatient Hospital Stay (HOSPITAL_COMMUNITY): Payer: Medicare HMO

## 2013-08-24 DIAGNOSIS — D62 Acute posthemorrhagic anemia: Secondary | ICD-10-CM | POA: Diagnosis not present

## 2013-08-24 DIAGNOSIS — K7460 Unspecified cirrhosis of liver: Secondary | ICD-10-CM | POA: Diagnosis not present

## 2013-08-24 DIAGNOSIS — E875 Hyperkalemia: Secondary | ICD-10-CM

## 2013-08-24 DIAGNOSIS — I059 Rheumatic mitral valve disease, unspecified: Secondary | ICD-10-CM

## 2013-08-24 DIAGNOSIS — K921 Melena: Secondary | ICD-10-CM

## 2013-08-24 DIAGNOSIS — K209 Esophagitis, unspecified without bleeding: Secondary | ICD-10-CM | POA: Diagnosis not present

## 2013-08-24 LAB — CBC
HCT: 27.9 % — ABNORMAL LOW (ref 39.0–52.0)
Hemoglobin: 9.2 g/dL — ABNORMAL LOW (ref 13.0–17.0)
MCH: 29.7 pg (ref 26.0–34.0)
MCHC: 33 g/dL (ref 30.0–36.0)
MCV: 90 fL (ref 78.0–100.0)
PLATELETS: 132 10*3/uL — AB (ref 150–400)
RBC: 3.1 MIL/uL — AB (ref 4.22–5.81)
RDW: 17.7 % — ABNORMAL HIGH (ref 11.5–15.5)
WBC: 4 10*3/uL (ref 4.0–10.5)

## 2013-08-24 LAB — IRON AND TIBC
Iron: 50 ug/dL (ref 42–135)
Saturation Ratios: 16 % — ABNORMAL LOW (ref 20–55)
TIBC: 307 ug/dL (ref 215–435)
UIBC: 257 ug/dL (ref 125–400)

## 2013-08-24 LAB — PROTIME-INR
INR: 2.1 — ABNORMAL HIGH (ref 0.00–1.49)
Prothrombin Time: 22.9 seconds — ABNORMAL HIGH (ref 11.6–15.2)

## 2013-08-24 LAB — BASIC METABOLIC PANEL
BUN: 149 mg/dL — ABNORMAL HIGH (ref 6–23)
CHLORIDE: 102 meq/L (ref 96–112)
CO2: 18 meq/L — AB (ref 19–32)
CREATININE: 4.66 mg/dL — AB (ref 0.50–1.35)
Calcium: 9.1 mg/dL (ref 8.4–10.5)
GFR calc Af Amer: 13 mL/min — ABNORMAL LOW (ref >90)
GFR calc non Af Amer: 11 mL/min — ABNORMAL LOW (ref >90)
Glucose, Bld: 184 mg/dL — ABNORMAL HIGH (ref 70–99)
POTASSIUM: 5 meq/L (ref 3.7–5.3)
Sodium: 140 mEq/L (ref 137–147)

## 2013-08-24 LAB — GLUCOSE, CAPILLARY
GLUCOSE-CAPILLARY: 169 mg/dL — AB (ref 70–99)
GLUCOSE-CAPILLARY: 92 mg/dL (ref 70–99)
GLUCOSE-CAPILLARY: 90 mg/dL (ref 70–99)
GLUCOSE-CAPILLARY: 98 mg/dL (ref 70–99)
Glucose-Capillary: 182 mg/dL — ABNORMAL HIGH (ref 70–99)
Glucose-Capillary: 193 mg/dL — ABNORMAL HIGH (ref 70–99)

## 2013-08-24 LAB — MRSA PCR SCREENING: MRSA BY PCR: NEGATIVE

## 2013-08-24 LAB — UREA NITROGEN, URINE: Urea Nitrogen, Ur: 303 mg/dL

## 2013-08-24 MED ORDER — PHYTONADIONE 5 MG PO TABS
5.0000 mg | ORAL_TABLET | Freq: Once | ORAL | Status: AC
  Administered 2013-08-24: 5 mg via ORAL
  Filled 2013-08-24: qty 1

## 2013-08-24 MED ORDER — PNEUMOCOCCAL VAC POLYVALENT 25 MCG/0.5ML IJ INJ
0.5000 mL | INJECTION | INTRAMUSCULAR | Status: AC
  Administered 2013-08-25: 0.5 mL via INTRAMUSCULAR
  Filled 2013-08-24: qty 0.5

## 2013-08-24 MED ORDER — OXYCODONE HCL 5 MG PO TABS: 5.0000 mg | ORAL_TABLET | Freq: Four times a day (QID) | ORAL | Status: DC | PRN

## 2013-08-24 NOTE — Consult Note (Signed)
[facility] CONSULT NOTE    Date: 08/24/2013                  Patient Name:  [name]  MRN: [id number]  DOB: [birth year]  Age / Sex: 73 y.o., male         PCP: [name], MD                 Service Requesting Consult: Critical care                 Reason for Consult: Acute renal failure            History of Present Illness: Patient is a 73 y.o. male with a PMHx of CKD III (Baseline creatinine of ~ 1.4), CHF (EF 25-30%), CAD, HTN, HLD, Afib, Bladder tumor, and COPD who was admitted to [facility] on 08/23/2013 for GI bleed and acute renal failure.   Patient reports 2-3 week history of nausea, anorexia, dark, tarry stools, increasing weight gain and abdominal distenstion.  He also had a few episodes of emesis, which were nonbloody and nonbilious.  He reports that he saw his physician for these symptoms and was found to have a + FOBT.  He was then sent to the hospital for evaluation. He also notes that he has continued to take his home diuretics despite his symptoms.  On admission, patient found to have hyperkalemia of 6.7 and Acute renal failure with Creatinine of 5.9.  Medications: Outpatient medications: Prescriptions prior to admission  Medication Sig Dispense Refill  . acetaminophen (TYLENOL) 500 MG tablet Take 500 mg by mouth every 6 (six) hours as needed for moderate pain.      Marland Kitchen albuterol (PROVENTIL) (5 MG/ML) 0.5% nebulizer solution Take 0.5 mLs (2.5 mg total) by nebulization every 6 (six) hours as needed for wheezing or shortness of breath.  150 mL  1  . amiodarone (PACERONE) 200 MG tablet Take 1 tablet (200 mg total) by mouth every morning.  30 tablet  3  . atorvastatin (LIPITOR) 20 MG tablet Take 20 mg by mouth at bedtime.       . carvedilol (COREG) 6.25 MG tablet Take 1 tablet (6.25 mg total) by mouth 2 (two) times daily with a meal.  60 tablet  5  . Cholecalciferol (VITAMIN D) 1000 UNITS capsule Take 1,000 Units by mouth every morning.      . docusate  sodium (COLACE) 100 MG capsule Take 100 mg by mouth 2 (two) times daily.      . insulin detemir (LEVEMIR) 100 UNIT/ML injection Inject 15 Units into the skin at bedtime.      . isosorbide mononitrate (IMDUR) 30 MG 24 hr tablet Take 1 tablet (30 mg total) by mouth at bedtime.  30 tablet  6  . LORazepam (ATIVAN) 0.5 MG tablet Take 0.5-1 mg by mouth at bedtime as needed (for sleep).       . metolazone (ZAROXOLYN) 2.5 MG tablet Take 2.5 mg by mouth 2 (two) times a week. Do not take if your weight is under 160# at home. Usually takes tuesdays and fridays      . Multiple Vitamin (MULTIVITAMIN WITH MINERALS) TABS tablet Take 1 tablet by mouth daily.      . Multiple Vitamin (MULTIVITAMIN WITH MINERALS) TABS Take 1 tablet by mouth daily.      Marland Kitchen oxyCODONE (OXY IR/ROXICODONE) 5 MG immediate release tablet Take 1 tablet (5 mg total) by mouth every 6 (six) hours as needed  for moderate pain.  20 tablet  0  . promethazine (PHENERGAN) 25 MG tablet Take 1 tablet (25 mg total) by mouth 3 (three) times daily as needed for nausea or vomiting.  6 tablet  0  . spironolactone (ALDACTONE) 25 MG tablet Take 100 mg by mouth daily.       Marland Kitchen torsemide (DEMADEX) 20 MG tablet Take 2 tablets (40 mg total) by mouth 2 (two) times daily. Hold afternoon dose if your weight at home is under 160#  120 tablet  11  . warfarin (COUMADIN) 5 MG tablet Take 5 mg by mouth at bedtime.        Current medications: Current Facility-Administered Medications  Medication Dose Route Frequency Provider Last Rate Last Dose  . amiodarone (PACERONE) tablet 200 mg  200 mg Oral q morning - 10a Konstantin Zubelevitskiy, MD      . atorvastatin (LIPITOR) tablet 20 mg  20 mg Oral QHS Doree Fudge, MD   20 mg at 08/23/13 2212  . insulin aspart (novoLOG) injection 0-15 Units  0-15 Units Subcutaneous Q4H Doree Fudge, MD   3 Units at 08/24/13 0430  . isosorbide mononitrate (IMDUR) 24 hr tablet 30 mg  30 mg Oral QHS Konstantin  Zubelevitskiy, MD      . ondansetron (ZOFRAN) injection 4 mg  4 mg Intravenous Q6H PRN Konstantin Zubelevitskiy, MD      . pantoprazole (PROTONIX) injection 40 mg  40 mg Intravenous Q12H Doree Fudge, MD   40 mg at 08/23/13 2230  . sodium bicarbonate tablet 650 mg  650 mg Oral BID Doree Fudge, MD   650 mg at 08/23/13 2210      Allergies: Allergies  Allergen Reactions  . Latex Other (See Comments)    Very thin skin, pulls off as removing bandaids       Past Medical History: Past Medical History  Diagnosis Date  . Atrial fibrillation      S/P ablation  . CAD (coronary artery disease)     S/P MI 1980's----------------------Dr McDowell  . Hyperlipidemia   . CHF (congestive heart failure)   . 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 BPH (benign prostatic hypertrophy)   . Stroke   . Sleep disturbance      Past Surgical History: Past Surgical History  Procedure Laterality Date  . Coronary artery bypass graft    . Appendectomy    . Tonsillectomy    . Upper gastrointestinal endoscopy      Gastritis , ? H. pylori 08/28/1999  . Atrial ablation surgery       Family History: Family History  Problem Relation Age of Onset  . Parkinson's disease Mother   . Heart disease Father   . Skin cancer Sister   . Heart attack Mother      Social History: History   Social History  . Marital Status: Single    Spouse Name: N/A    Number of Children: N/A  . Years of Education: N/A   Occupational History  . disabled due to heart disease    Social History Main Topics  . Smoking status: Former Smoker -- 1.00 packs/day    Types: Cigarettes    Quit date: 07/15/1973  . Smokeless tobacco: Never Used  . Alcohol Use: No  . Drug Use: No  . Sexual Activity: Not on file   Other Topics Concern  . Not on file   Social History Narrative   Single--lives alone   Stays with  sister with Altzheimer's frequently   Has living will    Brother, Delfino Lovett has health care POA   Would accept resuscitation but no prolonged artificial ventilation   No feeding tube if cognitively unaware              Review of Systems: As per HPI  Vital Signs: Blood pressure 95/43, pulse 72, temperature 98 F (36.7 C), temperature source Oral, resp. rate 16, height 5\' 9"  (1.753 m), weight 165 lb 9.1 oz (75.1 kg), SpO2 98.00%.  Weight trends: Filed Weights   08/23/13 2115 08/24/13 0700  Weight: 168 lb 14 oz (76.6 kg) 165 lb 9.1 oz (75.1 kg)    Physical Exam: General: resting in bed. NAD.  HEENT: NCAT. No scleral icterus.  Edentulous. Fundi benign Cardiovascular: RRR. 2/6 Systolic murmur heard throughout the precordium. Respiratory: Crackles noted posteriorly. Abdomen: soft, nontender; very distended.  Hepatomegaly noted.  Mod distension Extremities: Trace LE edema.  Skin: Bruising noted of upper extremities.  Hyperpigmentation noted of lower extremities.  Neuro: No focal deficits.  Lab results: Basic Metabolic Panel:  Recent Labs Lab 08/23/13 1534 08/23/13 2207 08/24/13 0325  NA 136* 141 140  K 6.3* 5.8* 5.0  CL 100 103 102  CO2 15* 16* 18*  GLUCOSE 154* 100* 184*  BUN 156* 148* 149*  CREATININE 5.22* 4.96* 4.66*  CALCIUM 9.3 9.9 9.1    Liver Function Tests:  Recent Labs Lab 08/23/13 0914 08/23/13 1534  AST 19 19  ALT 26 25  ALKPHOS 89 96  BILITOT 0.9 0.6  PROT 6.9 7.1  ALBUMIN 3.4* 3.4*    Recent Labs Lab 08/23/13 1534  LIPASE 71*    Recent Labs Lab 08/23/13 2207  AMMONIA 45    CBC:  Recent Labs Lab 08/23/13 0828 08/23/13 1534 08/23/13 1855 08/24/13 0325  WBC 4.7 4.0  --  4.0  NEUTROABS 3.5 2.8  --   --   HGB 9.6* 9.5* 9.9* 9.2*  HCT 28.9* 28.4* 29.2* 27.9*  MCV 88.9 88.8  --  90.0  PLT 123.0* 146*  --  132*    Cardiac Enzymes: No results found for this basename: CKTOTAL, CKMB, CKMBINDEX, TROPONINI,  in the last 168 hours  BNP: No components found with this basename: POCBNP,    CBG:  Recent Labs Lab 08/23/13 2140 08/23/13 2328 08/24/13 0357 08/24/13 0755  GLUCAP 106* 182* 169* 92    Microbiology: Results for orders placed during the hospital encounter of 08/23/13  MRSA PCR SCREENING     Status: None   Collection Time    08/23/13 10:45 PM      Result Value Range Status   MRSA by PCR NEGATIVE  NEGATIVE Final   Comment:            The GeneXpert MRSA Assay (FDA     approved for NASAL specimens     only), is one component of a     comprehensive MRSA colonization     surveillance program. It is not     intended to diagnose MRSA     infection nor to guide or     monitor treatment for     MRSA infections.    Coagulation Studies:  Recent Labs  08/23/13 1534  LABPROT 37.5*  INR 4.01*    Urinalysis:  Recent Labs  08/23/13 1548  COLORURINE YELLOW  LABSPEC 1.010  PHURINE 5.0  GLUCOSEU NEGATIVE  HGBUR NEGATIVE  BILIRUBINUR NEGATIVE  KETONESUR NEGATIVE  PROTEINUR NEGATIVE  UROBILINOGEN 0.2  NITRITE  NEGATIVE  LEUKOCYTESUR NEGATIVE      Imaging: US Renal  08/23/2013   CLINICAL DATA:  Acute renal failure.  Evaluate for hydronephrosis.  EXAM: RENAL/URINARY TRACT ULTRASOUND COMPLETE  COMPARISON:  06/09/2013  FINDINGS: Right Kidney:  Length: 12.9 cm. 4 mm echogenic structure in the right kidney upper pole is suggestive for a stone. No evidence for hydronephrosis.  Left Kidney:  Length: 13.4 cm. Echogenicity within normal limits. No mass or hydronephrosis visualized.  Bladder:  There is fluid in the urinary bladder. Urinary bladder wall appears to be thickened and similar to the prior CT.  Other findings:  Large amount of ascites throughout the abdomen.  IMPRESSION: Negative for hydronephrosis.  Large amount of ascites.  Right kidney stone.  Bladder wall thickening.   Electronically Signed   By: [name] M.D.   On: 08/23/2013 21:42   Dg Chest Port 1 View  08/23/2013   CLINICAL DATA:  Shortness of breath  EXAM: PORTABLE CHEST - 1 VIEW   COMPARISON:  Prior radiograph from 07/29/2013  FINDINGS: Median sternotomy wires with underlying CABG markers and surgical clips again seen. Cardiomegaly is stable.  Lungs are normally inflated. No focal infiltrate to suggest an acute infectious pneumonitis identified. Overall pulmonary vascularity is mildly increased without overt pulmonary edema. Minimal blunting of the right costophrenic angle is suggestive of a small right pleural effusion. No pneumothorax.  Osseous structures are unchanged.  IMPRESSION: 1. Stable cardiomegaly with mild diffuse pulmonary vascular congestion without overt pulmonary edema. No focal infiltrates identified. 2. Small right pleural effusion.   Electronically Signed   By: [name] M.D.   On: 08/23/2013 16:12      Assessment & Plan: Patient is a 73 y.o. male with a PMHx of CKD III (Baseline creatinine of ~ 1.4), DM-2, CHF (EF 25-30%), CAD, HTN, HLD, Afib, and COPD who was admitted to [facility] on 08/23/2013 for GI bleed and acute renal failure.   1) GI bleed 2) Acute on chronic renal failure - Likely secondary to poor perfusion in the setting of intravascular depletion secondary to CHF, diuretics and blood loss. Appears hemodynamic and improving acid/base/k.  Ascites puzzling, ? RHF.  May need some vol given.   3) Acute on chronic systolic CHF 4) Hx of bladder tumor No obstruction 5) Hx of AF, on Coumadin - INR elevated to 4 on admission 6) Hyperkalemia 7) AG acidosis 8) DM-2 9) HTN  Plan: Acute on chronic renal failure - Likely secondary to poor perfusion in the setting of intravascular depletion secondary to CHF, diuretics, blood loss, hypoperfusion.  - Baseline creatinine ~1.4 - On admission, Creatinine was 5.9. - UA unremarkable.  Renal US revealed NO obstruction, but a large of amount of ascites present. Obtaining further work up: Iron studies, PTH, urine sodium/creatinine in 24 hours.  - No further diuresis. - Continuing holding home BP  medications. - Will allow patient to equilibrate.  Creatinine already improving. May need vol.    Roberta PGY-2

## 2013-08-24 NOTE — Progress Notes (Signed)
PULMONARY / CRITICAL CARE MEDICINE  Name: [name] MRN: [id number] DOB: 1940-09-08    ADMISSION DATE:  08/23/2013 CONSULTATION DATE:  08/23/2013  REFERRING MD :  EDP PRIMARY SERVICE: PCCM  CHIEF COMPLAINT:  Blood in stools  BRIEF PATIENT DESCRIPTION: 73 yo with CKD and CHF (EF 25-30%), AF on Coumadin, presenting 1/20 with abd distension, blood in stools, hyperkalemia, and AKI.  SIGNIFICANT EVENTS / STUDIES:  1/20  Admitted with suspected upper GI hemoorhage, AKI, and hyperkalemia Renal U/S 1/20 >>>> Neg for hydronephrosis, 54mm stone in right kidney (seen on previous U/S in November), Bladder wall thickening, Large ascites Abdominal U/S 1/21 >>> TTE 1/21 >>>>   LINES / TUBES: LPIV 1/20  CULTURES: MRSA 1/20 >>> neg  ANTIBIOTICS: None   SUBJECTIVE:  Reports he feels much better today.  Complaining of some mild neck pain for which he usually takes tylenol.  VITAL SIGNS: Temp:  [97.3 F (36.3 C)-98 F (36.7 C)] 98 F (36.7 C) (01/21 0756) Pulse Rate:  [50-114] 68 (01/21 0700) Resp:  [12-25] 13 (01/21 0700) BP: (78-116)/(28-82) 87/40 mmHg (01/21 0700) SpO2:  [96 %-100 %] 100 % (01/21 0700) Weight:  [165 lb 9.1 oz (75.1 kg)-168 lb 14 oz (76.6 kg)] 165 lb 9.1 oz (75.1 kg) (01/21 0700)  HEMODYNAMICS:   VENTILATOR SETTINGS:   INTAKE / OUTPUT: Intake/Output     01/20 0701 - 01/21 0700 01/21 0701 - 01/22 0700   P.O. 720    I.V. (mL/kg) 90 (1.2)    IV Piggyback 50    Total Intake(mL/kg) 860 (11.5)    Urine (mL/kg/hr) 2725 550 (5.4)   Total Output 2725 550   Net -1865 -550         PHYSICAL EXAMINATION: General: resting in bed, O2 via [place] HEENT: EOMI, no scleral icterus Cardiac: RRR, 3/6 systolic murmur Pulm: clear to auscultation bilaterally Abd: soft, nontender, moderate distension, + fluid wave, BS present Ext: warm and well perfused, chronic darkening of skin of lower extremities 1+ B/L pedal edema Neuro: alert and oriented X3   LABS:  CBC  Recent  Labs Lab 08/23/13 0828 08/23/13 1534 08/23/13 1855 08/24/13 0325  WBC 4.7 4.0  --  4.0  HGB 9.6* 9.5* 9.9* 9.2*  HCT 28.9* 28.4* 29.2* 27.9*  PLT 123.0* 146*  --  132*   Coag's  Recent Labs Lab 08/18/13 1549 08/23/13 1534  INR 2.2 4.01*   BMET  Recent Labs Lab 08/23/13 1534 08/23/13 2207 08/24/13 0325  NA 136* 141 140  K 6.3* 5.8* 5.0  CL 100 103 102  CO2 15* 16* 18*  BUN 156* 148* 149*  CREATININE 5.22* 4.96* 4.66*  GLUCOSE 154* 100* 184*   Electrolytes  Recent Labs Lab 08/23/13 1534 08/23/13 2207 08/24/13 0325  CALCIUM 9.3 9.9 9.1   Sepsis Markers  Recent Labs Lab 08/23/13 1611  LATICACIDVEN 0.79   ABG No results found for this basename: PHART, PCO2ART, PO2ART,  in the last 168 hours  Liver Enzymes  Recent Labs Lab 08/23/13 0914 08/23/13 1534  AST 19 19  ALT 26 25  ALKPHOS 89 96  BILITOT 0.9 0.6  ALBUMIN 3.4* 3.4*   Cardiac Enzymes  Recent Labs Lab 08/23/13 1534  PROBNP 2953.0*   Glucose  Recent Labs Lab 08/23/13 2140 08/23/13 2328 08/24/13 0357  GLUCAP 106* 182* 169*    CXR: 1/21 none today  ASSESSMENT / PLAN:  PULMONARY A:  Shortness of breath likely to abdominal distension,  less likely to mild pulmonary  edema Pulmonary hypertension likely secondary to heart disease P:   -keep O2 sats > 92%, can use oxygen prn  CARDIOVASCULAR A:  Acute on chronic systolic CHF Baseline hypotension  H/o AF, now sinus bradycardia  P:  -Goal SBP>90  -Continue Amiodarone, Lipitor, IMDUR - Hold coreg -ACEI contraindicated as AKI / hyperkaleamia -TTE   RENAL A:  Acute on chronic renal failure (baseline Cr 1.5)- improving Cardiorenal syndrome (clinically hypervolemic)  Bladder tumor (discovered 1 month ago, 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 Lactic acid normal, CBG normal, no aspirin use) Hyperkalemia resolved P:   -Nephrology following -monitor I/O's -hold  Metolazone, Spironolactone, demadex -Hold further diuretic therapy, allow patient to equlibrate -bicarbonate (PO supplementation) -trend BMP  GASTROINTESTINAL A:   Subacute upper GI hemorrhage (baseline Hb = 11-12) FOBT slight positive per PCP on 1/19 Hepatosplenomegaly  P:   -GI following -protonix 40 bid -NPO, can advance to clears after abdominal U/S - EGD in am - Complete abdominal U/S  HEMATOLOGIC A:   Acute / subacute blood loss anemia- Hgb stable Coumadin coagulopathy ( indication AF )- INR today 2.1 P:  -trend CBC -hold Coumadin - Repeat INR in AM   INFECTIOUS A:   No acute issues P:   No intervention required  ENDOCRINE A:   DM type 2 P:   SSI Hold Levemir  NEUROLOGIC A:   No acute issues Neck pain P:   Hold Ativan / Oxycodone   Joni Reining, DO PGY-1 Pgr. [phone]  08/24/2013, 8:22 AM  Patient will be transferred out of ICU to stepdown.  [facility] to assume care in AM.   PCCM ATTENDING: I have interviewed and examined the patient and reviewed the database. I have formulated the assessment and plan as reflected in the note above with amendments made by me.   Merton Border, MD;  PCCM service; Mobile [phone]

## 2013-08-24 NOTE — Progress Notes (Signed)
[facility]  2D Echocardiogram completed.  Borden, RDCS 08/24/2013 11:39 AM

## 2013-08-24 NOTE — Consult Note (Signed)
[facility] Consult: 8:24 AM 08/24/2013  LOS: 1 day    Referring Provider:  Dr Doree Fudge, Primary Care Physician:  [name], MD Primary Gastroenterologist:  Dr. Verl Blalock    Reason for Consultation:  FOBT+, tarry stools, anemia.   HPI: [name] is a 73 y.o. male.  Type 2 IDDM, chronic obstructive lung disease,chronic atrial flutter on Coumadin, coronary artery disease, CABG, CHF with 25 to 30% EF.  Had NSAID gastritis (H pylori neg) and hyperplastic polyps on 2001 colon/EGD.  No PPI, no H2 blocker on home med list.  Takes multiple diuretics.  Hematuria in Nov 2014 worked up by Dr Ellin Mayhew with CT scan that showed nephrolithiasis and bladder wall tumor.  Pt was for cysto but it was delayed by prolonged right leg cellulitis treated unsuccesfully with Keflex and then with Levaquin, this completed 10 days ago and he was cleared to have cystoscopy today. Admitted 10/31 - 06/08/13 with acute renal failure, hematuria, CHF.   Labs at [place] office yesterday showed hyperkalemia (6.3), AKI (156/5.2), acidosis, coagulopathy (37.5/4.0), Anemia (9.5), Lipase 71 but LFTs normal thrombocytopenia (146). Small right pleural effusion on x ray.  This AM the renal function has improved, Hgb is 9.2.  Baseline Hgb in 06/2013 was 11 to 12.  2 weeks of abdominal distention, episodic N/V/D.  Blood noted in FOBT.  Tarry stools visually noted by pt yesterday. Did not improve despite prn phenergen.  No ppi was given.   Tarry stools, small in volume several times daily. No syncope. No abdominal pain.  No recent hematuria.  Appetite is stable, intermittent anorexia, non-bloody or CG emesis.  Wt was 165 on 1/12                         173      1/19                                                                                                                             165 now  Never transfused with blood or had anemia.  In 11/2012 an ultrasound (for enlarged liver on exam) showed hepatosplenomegaly.  This has never been evaluated by GI       Past Medical History  Diagnosis Date  . Atrial fibrillation      S/P ablation  . CAD (coronary artery disease)     S/P MI 1980's----------------------Dr McDowell  . Hyperlipidemia   . CHF (congestive heart failure)   . 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 BPH (benign prostatic hypertrophy)   . Stroke   . Sleep disturbance     Past Surgical History  Procedure Laterality Date  . Coronary artery bypass graft    . Appendectomy    . Tonsillectomy    . Upper gastrointestinal endoscopy      Gastritis , ? H. pylori 08/28/1999  . Atrial ablation surgery      Prior to Admission medications   Medication Sig Start Date End Date Taking? Authorizing Provider  acetaminophen (TYLENOL) 500 MG tablet Take 500 mg by mouth every 6 (six) hours as needed for moderate pain.   Yes Historical Provider, MD  albuterol (PROVENTIL) (5 MG/ML) 0.5% nebulizer solution Take 0.5 mLs (2.5 mg total) by nebulization every 6 (six) hours as needed for wheezing or shortness of breath. 06/28/13  Yes Venia Carbon, MD  amiodarone (PACERONE) 200 MG tablet Take 1 tablet (200 mg total) by mouth every morning. 08/08/13  Yes Minna Merritts, MD  atorvastatin (LIPITOR) 20 MG tablet Take 20 mg by mouth at bedtime.    Yes Historical Provider, MD  carvedilol (COREG) 6.25 MG tablet Take 1 tablet (6.25 mg total) by mouth 2 (two) times daily with a meal. 06/09/13  Yes Minna Merritts, MD  Cholecalciferol (VITAMIN D) 1000 UNITS capsule Take 1,000 Units by mouth every morning.   Yes Historical Provider, MD  docusate sodium (COLACE) 100 MG capsule Take 100 mg by  mouth 2 (two) times daily.   Yes Historical Provider, MD  insulin detemir (LEVEMIR) 100 UNIT/ML injection Inject 15 Units into the skin at bedtime. 06/28/13  Yes Venia Carbon, MD  isosorbide mononitrate (IMDUR) 30 MG 24 hr tablet Take 1 tablet (30 mg total) by mouth at bedtime. 07/11/13  Yes Minna Merritts, MD  LORazepam (ATIVAN) 0.5 MG tablet Take 0.5-1 mg by mouth at bedtime as needed (for sleep).    Yes Historical Provider, MD  metolazone (ZAROXOLYN) 2.5 MG tablet Take 2.5 mg by mouth 2 (two) times a week. Do not take if your weight is under 160# at home. Usually takes tuesdays and fridays 07/11/13  Yes Venia Carbon, MD  Multiple Vitamin (MULTIVITAMIN WITH MINERALS) TABS tablet Take 1 tablet by mouth daily.   Yes Historical Provider, MD  Multiple Vitamin (MULTIVITAMIN WITH MINERALS) TABS Take 1 tablet by mouth daily.   Yes Historical Provider, MD  oxyCODONE (OXY IR/ROXICODONE) 5 MG immediate release tablet Take 1 tablet (5 mg total) by mouth every 6 (six) hours as needed for moderate pain. 06/08/13  Yes Nishant Dhungel, MD  promethazine (PHENERGAN) 25 MG tablet Take 1 tablet (25 mg total) by mouth 3 (three) times daily as needed for nausea or vomiting. 07/27/13  Yes Venia Carbon, MD  spironolactone (ALDACTONE) 25 MG tablet Take 100 mg by mouth daily.  06/09/13  Yes Historical Provider, MD  torsemide (DEMADEX) 20 MG tablet Take 2 tablets (40 mg total) by mouth 2 (two) times daily. Hold afternoon dose if your weight at home is under 160# 05/05/13  Yes Venia Carbon, MD  warfarin (COUMADIN) 5 MG tablet Take 5 mg by mouth at bedtime.   Yes Historical Provider, MD    Scheduled Meds: . amiodarone  200 mg Oral q morning - 10a  . atorvastatin  20 mg Oral QHS  . carvedilol  6.25 mg Oral BID WC  . furosemide  40 mg Intravenous Q6H  . insulin aspart  0-15  Units Subcutaneous Q4H  . isosorbide mononitrate  30 mg Oral QHS  . pantoprazole (PROTONIX) IV  40 mg Intravenous Q12H  . sodium  bicarbonate  650 mg Oral BID   Infusions:   PRN Meds: ondansetron (ZOFRAN) IV   Allergies as of 08/23/2013 - Review Complete 08/23/2013  Allergen Reaction Noted  . Latex Other (See Comments) 08/23/2013    Family History  Problem Relation Age of Onset  . Parkinson's disease Mother   . Heart disease Father   . Skin cancer Sister   . Heart attack Mother     History   Social History  . Marital Status: Single    Spouse Name: N/A    Number of Children: N/A  . Years of Education: N/A   Occupational History  . disabled due to heart disease    Social History Main Topics  . Smoking status: Former Smoker -- 1.00 packs/day    Types: Cigarettes    Quit date: 07/15/1973  . Smokeless tobacco: Never Used  . Alcohol Use: No  . Drug Use: No  . Sexual Activity: Not on file   Other Topics Concern  . Not on file   Social History Narrative   Single--lives alone   Stays with sister with [facility]'s frequently   Has living will   Brother, Delfino Lovett has health care POA   Would accept resuscitation but no prolonged artificial ventilation   No feeding tube if cognitively unaware             REVIEW OF SYSTEMS: Constitutional:  He feels he has gained weight, almost exclusively in belly.   ENT:  No nose bleeds Pulm:  No dyspnea or vough CV:  No palpitations, no LE edema.  GU:  No hematuria, no frequency GI:  Per hpi  Heme:  Never had anemia before Transfusions:  None before Neuro:  No headaches, no peripheral tingling or numbness Derm:  No itching, no rash or sores. Thin skin tears easily Endocrine:  No sweats or chills.  No polyuria or dysuria Immunization:  Up to date on flu and other immunization.  Travel:  None beyond local counties in last few months.    PHYSICAL EXAM: Vital signs in last 24 hours: Filed Vitals:   08/24/13 0756  BP:   Pulse:   Temp: 98 F (36.7 C)  Resp:    Wt Readings from Last 3 Encounters:  08/24/13 75.1 kg (165 lb 9.1 oz)  08/22/13 78.472  kg (173 lb)  08/15/13 74.844 kg (165 lb)   General: looks well, pleasant, alert Head:  No swellling no asymmetry  Eyes:  No icterus, + conj pallor Ears:  Not HOH  Nose:  No discharge or congestio Mouth:  No teeth, no sores, slightly dry MM Neck:  No mass or JVD Lungs:  Few fine rales in base, no dyspnea or cough Heart: RRR with PVCs on monitor Abdomen:  Distended, NT, active BS.  Non palpable liver or spleen.  Injection sites bruising in lower abdomen Rectal: deferred. FOB + in office yesterday   Musc/Skeltl: no joint swelling or contracture Extremities:  bil 1+ pitting edema. Brisk cap refill.  Neurologic:  Pleasant, oriented x 3. No tremor Skin:  Darkened skin in the lower legs.  Feet warm Tattoos:  none Nodes:  No inguinal adenopathy   Psych:  Pleasant, relaxed, appropriate.   Intake/Output from previous day: 01/20 0701 - 01/21 0700 In: 860 [P.O.:720; I.V.:90; IV Piggyback:50] Out: 2725 [Urine:2725] Intake/Output this shift: Total I/O In: -  Out: 550 [Urine:550]  LAB RESULTS:  Recent Labs  08/23/13 0828 08/23/13 1534 08/23/13 1855 08/24/13 0325  WBC 4.7 4.0  --  4.0  HGB 9.6* 9.5* 9.9* 9.2*  HCT 28.9* 28.4* 29.2* 27.9*  PLT 123.0* 146*  --  132*   BMET Lab Results  Component Value Date   NA 140 08/24/2013   NA 141 08/23/2013   NA 136* 08/23/2013   K 5.0 08/24/2013   K 5.8* 08/23/2013   K 6.3* 08/23/2013   CL 102 08/24/2013   CL 103 08/23/2013   CL 100 08/23/2013   CO2 18* 08/24/2013   CO2 16* 08/23/2013   CO2 15* 08/23/2013   GLUCOSE 184* 08/24/2013   GLUCOSE 100* 08/23/2013   GLUCOSE 154* 08/23/2013   BUN 149* 08/24/2013   BUN 148* 08/23/2013   BUN 156* 08/23/2013   CREATININE 4.66* 08/24/2013   CREATININE 4.96* 08/23/2013   CREATININE 5.22* 08/23/2013   CALCIUM 9.1 08/24/2013   CALCIUM 9.9 08/23/2013   CALCIUM 9.3 08/23/2013   LFT  Recent Labs  08/23/13 0914 08/23/13 1534  PROT 6.9 7.1  ALBUMIN 3.4* 3.4*  AST 19 19  ALT 26 25  ALKPHOS 89 96  BILITOT 0.9  0.6   PT/INR Lab Results  Component Value Date   INR 4.01* 08/23/2013   INR 2.2 08/18/2013   INR 5.2 08/15/2013   Hepatitis Panel No results found for this basename: HEPBSAG, HCVAB, HEPAIGM, HEPBIGM,  in the last 72 hours C-Diff No components found with this basename: cdiff   Lipase     Component Value Date/Time   LIPASE 71* 08/23/2013 1534    Drugs of Abuse  No results found for this basename: labopia, cocainscrnur, labbenz, amphetmu, thcu, labbarb     RADIOLOGY STUDIES: US Renal 08/23/2013   CLINICAL DATA:  Acute renal failure.  Evaluate for hydronephrosis.  EXAM: RENAL/URINARY TRACT ULTRASOUND COMPLETE  COMPARISON:  06/09/2013  FINDINGS: Right Kidney:  Length: 12.9 cm. 4 mm echogenic structure in the right kidney upper pole is suggestive for a stone. No evidence for hydronephrosis.  Left Kidney:  Length: 13.4 cm. Echogenicity within normal limits. No mass or hydronephrosis visualized.  Bladder:  There is fluid in the urinary bladder. Urinary bladder wall appears to be thickened and similar to the prior CT.  Other findings:  Large amount of ascites throughout the abdomen.  IMPRESSION: Negative for hydronephrosis.  Large amount of ascites.  Right kidney stone.  Bladder wall thickening.   Electronically Signed   By: [name] M.D.   On: 08/23/2013 21:42   Dg Chest Port 1 View 08/23/2013   CLINICAL DATA:  Shortness of breath  EXAM: PORTABLE CHEST - 1 VIEW  COMPARISON:  Prior radiograph from 07/29/2013  FINDINGS: Median sternotomy wires with underlying CABG markers and surgical clips again seen. Cardiomegaly is stable.  Lungs are normally inflated. No focal infiltrate to suggest an acute infectious pneumonitis identified. Overall pulmonary vascularity is mildly increased without overt pulmonary edema. Minimal blunting of the right costophrenic angle is suggestive of a small right pleural effusion. No pneumothorax.  Osseous structures are unchanged.  IMPRESSION: 1. Stable cardiomegaly with mild  diffuse pulmonary vascular congestion without overt pulmonary edema. No focal infiltrates identified. 2. Small right pleural effusion.   Electronically Signed   By: [name] M.D.   On: 08/23/2013 16:12   11/04/2012  COMPLETE ABDOMINAL ULTRASOUND  Findings:  Gallbladder: There is mobile gallbladder sludge versus tiny  nonshadowing gallstones. The gallbladder wall is  somewhat  prominent measuring 3.4 mm diffusely. This can be seen with  hypoproteinemia. No pain is present with compression over the  gallbladder. There is a small amount of fluid adjacent to the  gallbladder.  Common bile duct: The common bile duct is normal measuring 5.1 mm  in diameter.  Liver: The liver is prominent measuring 19.7 cm sagittally. No  focal hepatic abnormality is seen.  IVC: Appears normal.  Pancreas: The tail of the pancreas is obscured by bowel gas.  Spleen: The spleen is enlarged measuring 14.0 cm sagittally with a  volume of 756 ml.  Right Kidney: No hydronephrosis is seen. The right kidney  measures 11.7 cm sagittally.  Left Kidney: No hydronephrosis is noted. The left kidney measures  13.2 cm.  Abdominal aorta: It the abdominal aorta is normal in caliber with  atheromatous change present.  IMPRESSION:  1. Splenomegaly.  2. Thickened gallbladder wall diffusely may indicate  hypoproteinemia. No present evidence of acute cholecystitis is  seen. Correlate with liver function tests.  3. Small amount of gallbladder sludge or nonshadowing gallstones.  4. Hepatomegaly.  5. The tail of the pancreas is obscured by bowel gas.    ENDOSCOPIC STUDIES: 09/1999  Colonoscopy  Dr Sharlett Iles For trace FOB + stool.  ASSESSMENT: This was an unremarkable colonoscopy except for a rather prominent polyp in the mid ascending  colon which is somewhat worrisome because of its size and central depression. Pathology COLON: HYPERPLASTIC POLYP. NO ADENOMATOUS CHANGE OR MALIGNANCY IDENTIFIED (THREE  FRAGMENTS).  08/1999 EGD For same showed diffuse chronic gastritis of unexplained etiology with a negative CLO biopsy. It was felt possibly that  this was secondary to NSAID damage. Pathology STOMACH, ANTRUM, BIOPSIES: FINDINGS CONSISTENT WITH MILD REACTIVE GASTROPATHY. NO HELICOBACTER PYLORI, INTESTINAL METAPLASIA, DYSPLASIA OR MALIGNANCY IDENTIFIED.    IMPRESSION:   *  Upper GI bleed.  Rule out ulcers, gastritis, portal gastropathy, varices, neoplasia Gastritis in 2001 was Hpylori negative and felt to be secondary to NSAID.  No longer uses NSAID, not taking PPI etc.  *  AKI, with acidosis, improved  *  CHF  *  Increased abdominal girth, rule out ascites.   *  Splenomegaly, hepatomegaly, GB sludge, thickened GB wall on April 2014 ultrasound. Rule out cirrhosis.  Pt not ever a heavy drinker.   *  Bladder tumor, recent diagnosed 06/2013 on CT scan by urologist after referral for hematuria.  Was for cysto and biopsy today.   *  Chronic Coumadin.  INR 4.0.  Got 10 mg Vitamin K at 2015 yesterday   PLAN:     *  EGD tomorrow if coags are corrected.  Will check INR now and in AM *  Ultrasound abdomen today, can have clears after this  *  BID PPI as doing.  Have not added octreotide.   Azucena Freed  08/24/2013, 8:24 AM Pager: [phone] Attending MD note:   I have taken a history, , and reviewed the chart. I agree with the Advanced Practitioner's impression and recommendations to correct the INR to < 2.0 before we can proceed with diagnostic EGD.Marland KitchenSplenomegaly worrisome for portal hypertension,  Doubt varices, assume normal baseline INR. Will need liver work up. High BUN /creat 4.0 c/w UGI bleed  Baseline BUN 72.If INR <2.0 , will proceed with EGD tomorrow? PUD, r/o D.Ulcer/ portal gastropathy?  [facility] Pager # [phone]

## 2013-08-24 NOTE — Care Management Note (Addendum)
  Page 2 of 2   09/02/2013     4:29:47 PM   CARE MANAGEMENT NOTE 09/02/2013  Patient:  [name], [name]   Account Number:  [id number]  Date Initiated:  08/24/2013  Documentation initiated by:  [facility]  Subjective/Objective Assessment:   Admitted with renal failure - hyperkalemia.     Action/Plan:   Anticipated DC Date:  08/29/2013   Anticipated DC Plan:  HOME/SELF CARE  In-house referral  Clinical Social Worker      DC Planning Services  CM consult      Choice offered to / List presented to:  C-1 Patient      DME agency  [facility].        Status of service:  Completed, signed off Medicare Important Message given?   (If response is "NO", the following Medicare IM given date fields will be blank) Date Medicare IM given:   Date Additional Medicare IM given:    Discharge Disposition:  [place]  Per UR Regulation:  Reviewed for med. necessity/level of care/duration of stay  If discussed at [place] of Stay Meetings, dates discussed:    Comments:  09/02/2013 Disposition Update: [place] has been received for 7 days. [address] RN, BSN, [place], CCM [phone] 09/02/2013  08/29/2013 PT RECS: Home health PT; Supervision/Assistance - 24 hour DME RECS:  Other (comment) (4 wheeled RW with seat) CM met with patient to discuss d/c plan and Recs for 24 hour assistance.  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  Patient stays with sister during the day and cooks for her; sister lives in project home and patient has to return back to his home at night.    Patient states he is not home bound and this interferes with his service options.  Patient states financial concerns of cost of care and does not want to incur any additional expenses.  Patient admits to inability to read very well which interferes with reading food labels. Transportation:  Self - still  driving Emergency Contact:  [name] [phone] and [name], [name] [phone] However, patient states he has no one who can provide him care if needed. CM discussed options such as discussing sister's care with her PCP to request possible assistance in the home with her care so that patient can care for himself more effectively. Patient refuses SNF and ineligible for HHS d/t not being homebound status.  Patient elects to go home and do the best he can do.  SW notified of refusal for SNF. CM will order DME:  RW/w seat at time of d/c [address] RN, BSN, [place], CCM [phone] 08/29/2013  Contact:  [name] Sister [phone]                [name],[name] Brother     [phone]  08-24-13 1:20pm Luz Lex, RNBSN [phone] Pateint lives at home alone, but drives to [place] daily to stay with sister who has dementia.   He states they assist each other.  has been offered HH in the past but turned down at this time because he has to go back and forth to sister and he can't stay with sister due to her rental policy.  CM will continue to follow.

## 2013-08-25 ENCOUNTER — Encounter (HOSPITAL_COMMUNITY): Payer: Self-pay | Admitting: *Deleted

## 2013-08-25 ENCOUNTER — Encounter (HOSPITAL_COMMUNITY)
Admission: EM | Disposition: A | Discharge status: Skilled Nursing Facility | Payer: Self-pay | Source: Home / Self Care | Attending: Internal Medicine

## 2013-08-25 ENCOUNTER — Inpatient Hospital Stay (HOSPITAL_COMMUNITY): Payer: Medicare HMO

## 2013-08-25 DIAGNOSIS — D62 Acute posthemorrhagic anemia: Secondary | ICD-10-CM | POA: Diagnosis not present

## 2013-08-25 DIAGNOSIS — K2970 Gastritis, unspecified, without bleeding: Secondary | ICD-10-CM | POA: Diagnosis present

## 2013-08-25 DIAGNOSIS — K209 Esophagitis, unspecified without bleeding: Secondary | ICD-10-CM | POA: Insufficient documentation

## 2013-08-25 DIAGNOSIS — I5023 Acute on chronic systolic (congestive) heart failure: Secondary | ICD-10-CM | POA: Diagnosis not present

## 2013-08-25 DIAGNOSIS — R188 Other ascites: Secondary | ICD-10-CM | POA: Diagnosis present

## 2013-08-25 DIAGNOSIS — E875 Hyperkalemia: Secondary | ICD-10-CM | POA: Diagnosis not present

## 2013-08-25 DIAGNOSIS — K7460 Unspecified cirrhosis of liver: Secondary | ICD-10-CM | POA: Diagnosis present

## 2013-08-25 HISTORY — PX: ESOPHAGOGASTRODUODENOSCOPY: SHX5428

## 2013-08-25 LAB — GLUCOSE, CAPILLARY
GLUCOSE-CAPILLARY: 223 mg/dL — AB (ref 70–99)
GLUCOSE-CAPILLARY: 231 mg/dL — AB (ref 70–99)
GLUCOSE-CAPILLARY: 76 mg/dL (ref 70–99)
GLUCOSE-CAPILLARY: 86 mg/dL (ref 70–99)
GLUCOSE-CAPILLARY: 93 mg/dL (ref 70–99)
Glucose-Capillary: 89 mg/dL (ref 70–99)
Glucose-Capillary: 93 mg/dL (ref 70–99)

## 2013-08-25 LAB — RENAL FUNCTION PANEL
Albumin: 3.2 g/dL — ABNORMAL LOW (ref 3.5–5.2)
BUN: 96 mg/dL — AB (ref 6–23)
CHLORIDE: 104 meq/L (ref 96–112)
CO2: 23 meq/L (ref 19–32)
CREATININE: 2.8 mg/dL — AB (ref 0.50–1.35)
Calcium: 9.1 mg/dL (ref 8.4–10.5)
GFR calc Af Amer: 25 mL/min — ABNORMAL LOW (ref >90)
GFR, EST NON AFRICAN AMERICAN: 21 mL/min — AB (ref >90)
Glucose, Bld: 90 mg/dL (ref 70–99)
POTASSIUM: 4 meq/L (ref 3.7–5.3)
Phosphorus: 4.5 mg/dL (ref 2.3–4.6)
Sodium: 144 mEq/L (ref 137–147)

## 2013-08-25 LAB — PARATHYROID HORMONE, INTACT (NO CA): PTH: 31.7 pg/mL (ref 14.0–72.0)

## 2013-08-25 LAB — CREATININE, URINE, RANDOM: CREATININE, URINE: 47.36 mg/dL

## 2013-08-25 LAB — TYPE AND SCREEN
ABO/RH(D): O POS
ANTIBODY SCREEN: NEGATIVE

## 2013-08-25 LAB — CBC
HCT: 28.5 % — ABNORMAL LOW (ref 39.0–52.0)
Hemoglobin: 9.4 g/dL — ABNORMAL LOW (ref 13.0–17.0)
MCH: 29.7 pg (ref 26.0–34.0)
MCHC: 33 g/dL (ref 30.0–36.0)
MCV: 90.2 fL (ref 78.0–100.0)
Platelets: 139 10*3/uL — ABNORMAL LOW (ref 150–400)
RBC: 3.16 MIL/uL — AB (ref 4.22–5.81)
RDW: 17.6 % — ABNORMAL HIGH (ref 11.5–15.5)
WBC: 5.4 10*3/uL (ref 4.0–10.5)

## 2013-08-25 LAB — PROTIME-INR
INR: 1.75 — AB (ref 0.00–1.49)
PROTHROMBIN TIME: 19.9 s — AB (ref 11.6–15.2)

## 2013-08-25 LAB — FERRITIN: Ferritin: 267 ng/mL (ref 22–322)

## 2013-08-25 LAB — HEPATITIS PANEL, ACUTE
HCV AB: NEGATIVE
Hep A IgM: NONREACTIVE
Hep B C IgM: NONREACTIVE
Hepatitis B Surface Ag: NEGATIVE

## 2013-08-25 LAB — SODIUM, URINE, RANDOM: Sodium, Ur: 39 mEq/L

## 2013-08-25 SURGERY — EGD (ESOPHAGOGASTRODUODENOSCOPY)
Anesthesia: Moderate Sedation

## 2013-08-25 MED ORDER — DIPHENHYDRAMINE HCL 50 MG/ML IJ SOLN
INTRAMUSCULAR | Status: AC
  Filled 2013-08-25: qty 1

## 2013-08-25 MED ORDER — FENTANYL CITRATE 0.05 MG/ML IJ SOLN
INTRAMUSCULAR | Status: DC | PRN
  Administered 2013-08-25: 25 ug via INTRAVENOUS

## 2013-08-25 MED ORDER — BUTAMBEN-TETRACAINE-BENZOCAINE 2-2-14 % EX AERO
INHALATION_SPRAY | CUTANEOUS | Status: DC | PRN
  Administered 2013-08-25: 2 via TOPICAL

## 2013-08-25 MED ORDER — FENTANYL CITRATE 0.05 MG/ML IJ SOLN
INTRAMUSCULAR | Status: AC
  Filled 2013-08-25: qty 2

## 2013-08-25 MED ORDER — MIDAZOLAM HCL 10 MG/2ML IJ SOLN
INTRAMUSCULAR | Status: DC | PRN
  Administered 2013-08-25: 2 mg via INTRAVENOUS

## 2013-08-25 MED ORDER — MIDAZOLAM HCL 5 MG/ML IJ SOLN
INTRAMUSCULAR | Status: AC
  Filled 2013-08-25: qty 2

## 2013-08-25 MED ORDER — SODIUM CHLORIDE 0.9 % IV SOLN
1020.0000 mg | Freq: Once | INTRAVENOUS | Status: AC
  Administered 2013-08-25: 1020 mg via INTRAVENOUS
  Filled 2013-08-25: qty 34

## 2013-08-25 MED ORDER — SODIUM CHLORIDE 0.9 % IV SOLN
INTRAVENOUS | Status: DC
  Administered 2013-08-25: 500 mL via INTRAVENOUS
  Administered 2013-08-25: 08:00:00 via INTRAVENOUS
  Administered 2013-08-26: 250 mL via INTRAVENOUS

## 2013-08-25 NOTE — Progress Notes (Signed)
Received patient from 2100 report from [facility].  Patient oriented to room, call bell within reach, bed at lowest position.  BP 114/64  Pulse 82  Temp(Src) 98.6 F (37 C) (Oral)  Resp 26  Ht 5\' 9"  (1.753 m)  Wt 69.9 kg (154 lb 1.6 oz)  BMI 22.75 kg/m2  SpO2 98%

## 2013-08-25 NOTE — Progress Notes (Signed)
Team 1 - Stepdown / ICU Progress Note  [name] [id number] DOB: Jun 28, 1941 DOA: 08/23/2013 PCP: [name], MD  Brief narrative: 73 year old male patient brought to the emergency department by EMS. 24 hours prior to presentation the patient had blood work done at his primary care physician's office which demonstrated marked hyperkalemia and other lab abnormalities. The primary care physician's office called EMS to have the patient transported to the hospital. After arrival the patient endorsed that he had been experiencing abdominal discomfort for 2 weeks with some nausea and vomiting and diarrhea. It was reported that at the primary care physician's office he had heme positive stool. Patient is on chronic Coumadin fracture fibrillation. Endorsed on the date of presentation his stools were tarry in appearance. Laboratory data performed in the ER revealed INR elevated at 4.01. He was subsequently admitted to the ICU.  At admission he remained hemodynamically stable. He was given vitamin K to correct his coagulopathy. He was given Kayexalate to treat his hyperkalemia. His abdominal ultrasound was consistent with the diagnosis of cirrhosis and ascites. It was suspected he may have been hypervolemic with possible cardiorenal syndrome so he initially was given IV Lasix. Gastroenterology was consulted as well as nephrology. Renal ultrasound revealed no evidence of obstruction. Patient not required hemodialysis since admission.  His potassium was corrected and other labs remained stable or improving and therefore he was deemed appropriate to transfer out of the ICU.  Assessment/Plan: Active Problems:   Acute renal failure/Hyperkalemia/Metabolic acidosis -Multifactorial in etiology likely from poor perfusion in the setting of dehydration, low output congestive heart failure and concomitant use of diuretics as well as recent blood loss from GI bleed -BUN was markedly elevated at 149 with a  creatinine of 4.66 at presentation and now has trended downward to a BUN of 96 a creatinine of 2.8 -No indications for acute dialysis at this time -Negative balance of 6.5 L since admission    Upper GI hemorrhage: A) Acute esophagitis B)  Gastritis -Appreciate GI assistance -No further bleeding since admission -EGD 1/22 revealed evidence of esophagitis but no varices-biopsy pending including H. pylori specimen -Continue PPI   Hypotension -Possibly a combination of low output heart failure but was aggressively diuresed at time of admission with greater than 6 L removed so this in setting of use of Imdur is likely precipitating factor therefore we'll discontinue nitrate for now -Consider fluid bolus versus check CVP -Check orthostatic vital signs    Warfarin-induced coagulopathy -Was given vitamin K and current INR is 1.75 -Defer to GI as to when will be appropriate to resume anticoagulation     COPD (chronic obstructive pulmonary disease) -Compensated without wheezing   Acute Hypoxemic respiratory failure -Appears to be more mechanical in nature at this point given abdominal distention for persistent ascites -Chest x-ray at time of admission only revealed mild diffuse pulmonary vascular congestion without overt pulmonary edema-repeat chest x-ray today.    Chronic systolic heart failure -Echocardiogram this admission reveals an EF of 35-40%-his EF is actually improved compared with echocardiogram from 2013 at which time EF was estimated between 25 and 30% -Of note right ventricle was normal despite patient's underlying COPD although he did have moderate tricuspid regurgitation so suspect may have a degree of pulmonary HTN-echo 2013 with moderate to severe pulmonary hypertension reading 62 mmHg    Acute blood loss anemia -Hemoglobin has remained stable around 9 -Iron saturation low so nephrology and Fereheme    Cirrhosis of liver with ascites -  New finding -Patient endorses only  minimal alcohol intake so 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 -Unclear if candidate for paracentesis -No apparent abdominal pain so doubt SBP -Does have nontender periumbilical hernia which is protruding due to pressure from ascites    CORONARY ARTERY DISEASE -Discontinue Imdur given blood pressures in the 80s and recommendations from renal to improve renal perfusion by increasing blood pressure    Atrial fibrillation -Continue amiodarone -Coumadin preadmission with current INR subtherapeutic in setting of recent GI bleeding    HTN (hypertension) -BP still soft and given progression of renal dysfunction suspected to be related to hypoperfusion we'll continue to hold blood pressure medication    Type II or unspecified type diabetes mellitus with peripheral circulatory disorders, uncontrolled(250.72) -CBGs low -Continue sliding scale insulin   DVT prophylaxis: SCDs Code Status: Full Family Communication: No family at bedside Disposition Plan/Expected LOS: Stepdown given recurrent hypotension   Consultants: Nephrology Gastroenterology  Procedures: Echocardiogram - Left ventricle: The cavity size was normal. Wall thickness was normal. Systolic function was moderately reduced. The estimated ejection fraction was in the range of 35% to 40%. There is hypokinesis of the inferior myocardium. - Mitral valve: Calcified annulus. Mildly thickened leaflets . Moderate regurgitation. - Left atrium: The atrium was moderately dilated. - Right atrium: The atrium was mildly to moderately dilated. - Tricuspid valve: Moderate regurgitation.  EGD grade 1 esophagitis  No esophageal varices  Mild diffuse antral gastritis. Status post biopsies to rule out H.  pylori  Antibiotics: None  HPI/Subjective: Patient alert without any specific complaints. Denies trouble breathing or abdominal pain.  Objective: Blood pressure 85/41, pulse 79, temperature 99.3 F (37.4 C), temperature source Oral,  resp. rate 16, height 5\' 9"  (1.753 m), weight 154 lb 1.6 oz (69.9 kg), SpO2 96.00%.  Intake/Output Summary (Last 24 hours) at 08/25/13 1441 Last data filed at 08/25/13 1400  Gross per 24 hour  Intake   1310 ml  Output   3650 ml  Net  -2340 ml     Exam: General: No acute respiratory distress Lungs: Clear to auscultation bilaterally without wheezes or crackles, RA Cardiovascular: Irregular rate and rhythm without murmur gallop or rub normal S1 and S2, no peripheral edema or JVD-labile BP ranging from 80 to 403 systolic-no peripheral edema Abdomen: Nontender, markedly distended but soft, nontender periumbilical hernia bowel sounds positive, no rebound, no ascites, no appreciable mass Musculoskeletal: No significant cyanosis, clubbing of bilateral lower extremities Neurological: Alert and oriented x 3, moves all extremities x 4 without focal neurological deficits, CN 2-12 intact  Scheduled Meds:  Scheduled Meds: . amiodarone  200 mg Oral q morning - 10a  . atorvastatin  20 mg Oral QHS  . insulin aspart  0-15 Units Subcutaneous Q4H  . isosorbide mononitrate  30 mg Oral QHS  . pantoprazole (PROTONIX) IV  40 mg Intravenous Q12H  . pneumococcal 23 valent vaccine  0.5 mL Intramuscular Tomorrow-1000  . sodium bicarbonate  650 mg Oral BID   Continuous Infusions: . sodium chloride 500 mL (08/25/13 1151)    Data Reviewed: Basic Metabolic Panel:  Recent Labs Lab 08/23/13 0914 08/23/13 1534 08/23/13 2207 08/24/13 0325 08/25/13 0715  NA 134* 136* 141 140 144  K 6.7* 6.3* 5.8* 5.0 4.0  CL 105 100 103 102 104  CO2 18* 15* 16* 18* 23  GLUCOSE 150* 154* 100* 184* 90  BUN 156* 156* 148* 149* 96*  CREATININE 5.9* 5.22* 4.96* 4.66* 2.80*  CALCIUM 9.1 9.3 9.9 9.1 9.1  PHOS  --   --   --   --  4.5   Liver Function Tests:  Recent Labs Lab 08/23/13 0914 08/23/13 1534 08/25/13 0715  AST 19 19  --   ALT 26 25  --   ALKPHOS 89 96  --   BILITOT 0.9 0.6  --   PROT 6.9 7.1  --     ALBUMIN 3.4* 3.4* 3.2*    Recent Labs Lab 08/23/13 1534  LIPASE 71*    Recent Labs Lab 08/23/13 2207  AMMONIA 45   CBC:  Recent Labs Lab 08/23/13 0828 08/23/13 1534 08/23/13 1855 08/24/13 0325 08/25/13 0215  WBC 4.7 4.0  --  4.0 5.4  NEUTROABS 3.5 2.8  --   --   --   HGB 9.6* 9.5* 9.9* 9.2* 9.4*  HCT 28.9* 28.4* 29.2* 27.9* 28.5*  MCV 88.9 88.8  --  90.0 90.2  PLT 123.0* 146*  --  132* 139*   Cardiac Enzymes: No results found for this basename: CKTOTAL, CKMB, CKMBINDEX, TROPONINI,  in the last 168 hours BNP (last 3 results)  Recent Labs  11/29/12 0655 06/03/13 2108 08/23/13 1534  PROBNP 1278.0* 1390.0* 2953.0*   CBG:  Recent Labs Lab 08/24/13 1923 08/24/13 2349 08/25/13 0412 08/25/13 0745 08/25/13 1117  GLUCAP 193* 93 89 93 86    Recent Results (from the past 240 hour(s))  MRSA PCR SCREENING     Status: None   Collection Time    08/23/13 10:45 PM      Result Value Range Status   MRSA by PCR NEGATIVE  NEGATIVE Final   Comment:            The GeneXpert MRSA Assay (FDA     approved for NASAL specimens     only), is one component of a     comprehensive MRSA colonization     surveillance program. It is not     intended to diagnose MRSA     infection nor to guide or     monitor treatment for     MRSA infections.     Studies:  Recent x-ray studies have been reviewed in detail by the Attending Physician  Time spent :     Erin Hearing, [place] Office  [phone] Pager [phone]  **If unable to reach the above provider after paging please contact the [facility] @ [phone]  On-Call/Text Page:      [email or web address]      password [email or web address]  If 7PM-7AM, please contact night-coverage [email or web address] Password [facility] 08/25/2013, 2:41 PM   LOS: 2 days   I have examined the patient, reviewed the chart and modified the above note which I agree with.   Avayah Raffety,MD [phone] 08/25/2013, 6:29 PM

## 2013-08-25 NOTE — Progress Notes (Signed)
Subjective  Stable, no complaints, no stool x24 hours   Objective  S/p UGI bleed, Hgb  Stable at 9.4 this morning,  For EGD later this am Vital signs in last 24 hours: Temp:  [97.9 F (36.6 C)-99.3 F (37.4 C)] 98.4 F (36.9 C) (01/22 0414) Pulse Rate:  [70-100] 79 (01/22 0600) Resp:  [14-22] 16 (01/22 0600) BP: (82-127)/(39-62) 102/57 mmHg (01/22 0600) SpO2:  [96 %-100 %] 100 % (01/22 0600) FiO2 (%):  [2 %] 2 % (01/21 1300) Last BM Date: 08/23/13 General:    white male in NAD.  Heart:  Regular rate and rhythm; 2/6 sys murmurs Lungs: Respirations even and unlabored, lungs insp. rales Abdomen:  Soft, nontender and nondistended. Normal bowel sounds. Extremities:  Without edema. Neurologic:  Alert and oriented,  grossly normal neurologically. Psych:  Cooperative. Normal mood and affect.  Intake/Output from previous day: 01/21 0701 - 01/22 0700 In: 1260 [P.O.:1200; I.V.:60] Out: 1740 [Urine:5525] Intake/Output this shift:    Lab Results:  Recent Labs  08/23/13 1534 08/23/13 1855 08/24/13 0325 08/25/13 0215  WBC 4.0  --  4.0 5.4  HGB 9.5* 9.9* 9.2* 9.4*  HCT 28.4* 29.2* 27.9* 28.5*  PLT 146*  --  132* 139*   BMET  Recent Labs  08/23/13 1534 08/23/13 2207 08/24/13 0325  NA 136* 141 140  K 6.3* 5.8* 5.0  CL 100 103 102  CO2 15* 16* 18*  GLUCOSE 154* 100* 184*  BUN 156* 148* 149*  CREATININE 5.22* 4.96* 4.66*  CALCIUM 9.3 9.9 9.1   LFT  Recent Labs  08/23/13 1534  PROT 7.1  ALBUMIN 3.4*  AST 19  ALT 25  ALKPHOS 96  BILITOT 0.6   PT/INR  Recent Labs  08/24/13 1052 08/25/13 0215  LABPROT 22.9* 19.9*  INR 2.10* 1.75*    Studies/Results: US Abdomen Complete  08/24/2013   CLINICAL DATA:  Hepatomegaly.  EXAM: ULTRASOUND ABDOMEN COMPLETE  COMPARISON:  CT scan of the abdomen dated 06/09/2013  FINDINGS: Gallbladder:  There is sludge in the gallbladder. Gallbladder wall is normal. Negative sonographic Murphy's sign.  Common bile duct:  Diameter:  5.3 mm, normal.  Liver:  Diffuse hepatomegaly with increased echogenicity of the liver parenchyma, nodularity of the liver contour and increased echogenicity of periportal structures. No focal lesions.  IVC:  Normal.  Pancreas:  The head and body of the pancreas are normal. The tail of the pancreas is obscured by bowel.  Spleen:  Normal.  9.9 cm in length.  Right Kidney:  Length: 12.8 cm. Small stone in the upper pole. Increased echogenicity renal parenchyma consistent with renal medical disease.  Left Kidney:  Length: 14.4 cm. Increased echogenicity of the renal parenchyma consistent with renal medical disease.  Abdominal aorta:  Normal.  2.1 cm maximum diameter.  Other findings:  Diffuse moderate ascites.  IMPRESSION: 1. Hepatomegaly with findings suggestive of cirrhosis. 2. Moderate abdominal ascites. 3. Increased echogenicity of the renal parenchyma consistent with renal medical disease.   Electronically Signed   By: [name] M.D.   On: 08/24/2013 14:30   US Renal  08/23/2013   CLINICAL DATA:  Acute renal failure.  Evaluate for hydronephrosis.  EXAM: RENAL/URINARY TRACT ULTRASOUND COMPLETE  COMPARISON:  06/09/2013  FINDINGS: Right Kidney:  Length: 12.9 cm. 4 mm echogenic structure in the right kidney upper pole is suggestive for a stone. No evidence for hydronephrosis.  Left Kidney:  Length: 13.4 cm. Echogenicity within normal limits. No mass or hydronephrosis visualized.  Bladder:  There is fluid in the urinary bladder. Urinary bladder wall appears to be thickened and similar to the prior CT.  Other findings:  Large amount of ascites throughout the abdomen.  IMPRESSION: Negative for hydronephrosis.  Large amount of ascites.  Right kidney stone.  Bladder wall thickening.   Electronically Signed   By: [name] M.D.   On: 08/23/2013 21:42   Dg Chest Port 1 View  08/23/2013   CLINICAL DATA:  Shortness of breath  EXAM: PORTABLE CHEST - 1 VIEW  COMPARISON:  Prior radiograph from 07/29/2013  FINDINGS:  Median sternotomy wires with underlying CABG markers and surgical clips again seen. Cardiomegaly is stable.  Lungs are normally inflated. No focal infiltrate to suggest an acute infectious pneumonitis identified. Overall pulmonary vascularity is mildly increased without overt pulmonary edema. Minimal blunting of the right costophrenic angle is suggestive of a small right pleural effusion. No pneumothorax.  Osseous structures are unchanged.  IMPRESSION: 1. Stable cardiomegaly with mild diffuse pulmonary vascular congestion without overt pulmonary edema. No focal infiltrates identified. 2. Small right pleural effusion.   Electronically Signed   By: [name] M.D.   On: 08/23/2013 16:12       Assessment / Plan:   ** Upper GI bleed. Rule out ulcers, gastritis, portal gastropathy, varices, neoplasia  Gastritis in 2001 was Hpylori negative and felt to be secondary to NSAID. No longer uses NSAID, not taking PPI etc.  * AKI, with acidosis, improved ,creat down, renal following * CHF  * Increased abdominal girth, rule out ascites.  * Splenomegaly, hepatomegaly, GB sludge, thickened GB wall on April 2014 ultrasound.  Rule out cirrhosis. Pt not ever a heavy drinker.  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 Was for cysto and biopsy today.  * Chronic Coumadin. INR 4.0. Got 10 mg Vitamin K at 2015 yesterday , INR down to 1.75   **  Active Problems:   Acute renal failure   Hyperkalemia   Metabolic acidosis   Upper GI hemorrhage   Warfarin-induced coagulopathy   Acute blood loss anemia     LOS: 2 days   Delfin Edis  08/25/2013, 7:04 AM

## 2013-08-25 NOTE — Op Note (Signed)
[facility] [place], [id number]   ENDOSCOPY PROCEDURE REPORT  PATIENT: [name], [name]  MR#: [id number] BIRTHDATE: November 03, 1940 , 72  yrs. old GENDER: Male ENDOSCOPIST: Lafayette Dragon, MD REFERRED BY: Dr Chauncey Cruel.Rizwan PROCEDURE DATE:  08/25/2013 PROCEDURE:  EGD w/ biopsy ASA CLASS:     Class III INDICATIONS:  heme positive anemia, hepatomegaly rule out cirrhosis.  MEDICATIONS: These medications were titrated to patient response per physician's verbal order, Fentanyl 25 mcg IV, and Versed 2 mg IV TOPICAL ANESTHETIC: Cetacaine Spray  DESCRIPTION OF PROCEDURE: After the risks benefits and alternatives of the procedure were thoroughly explained, informed consent was obtained.  The Pentax Gastroscope [id number] endoscope was introduced through the mouth and advanced to the second portion of the duodenum. Without limitations.  The instrument was slowly withdrawn as the mucosa was fully examined.      Esophagus:[    proximal mid and distal esophagus was normal. There were a few short erosions at the GE junction is consistent with grade a esophagitis there were no esophageal varices Stomach: Gastric mucosa was diffusely erythematous. There was small amount of bile along the greater curvature of the stomach. Retroflexion of the endoscope revealed no normal fundus and cardia. Biopsies were taken from gastric antrum to rule out H. pyloric gastritis. Pyloric outlet was normalDuodenum: Duodenal bulb and descending duodenum were normal The scope was then withdrawn from the patient and the procedure completed.  COMPLICATIONS: There were no complications. ENDOSCOPIC IMPRESSION: grade 1 esophagitis No esophageal varices Mild diffuse antral gastritis. Status post biopsies to rule out H. pylori RECOMMENDATIONS: 1.  Anti-reflux regimen to be follow 2.  Await biopsy results 3.  Continue PPI   REPEAT EXAM:  eSigned:  Lafayette Dragon, MD 08/25/2013 1:43  PM   CC:  PATIENT NAME:  Kadrian, Partch MR#: [id number]

## 2013-08-25 NOTE — Progress Notes (Signed)
Daily Rounding Note  08/26/2013, 8:26 AM  LOS: 3 days   SUBJECTIVE:       Hungry and still on clears.  No nausea.  Last BMs were day of admit, dark. Breathing at baseline.  No c/o pain.  OBJECTIVE:         Vital signs in last 24 hours:    Temp:  [97.7 F (36.5 C)-99.3 F (37.4 C)] 98.4 F (36.9 C) (01/23 0810) Pulse Rate:  [75-117] 112 (01/23 0810) Resp:  [12-26] 25 (01/23 0810) BP: (72-117)/(32-85) 93/65 mmHg (01/23 0810) SpO2:  [90 %-100 %] 98 % (01/23 0810) Last BM Date: 08/25/13 General: pleasant, alert, comfortable.  Looks jaundiced   Heart: RRR with 2/6 murmer Chest: clear bil.  No dyspnea or cought Abdomen: soft, protuberant with obvious ascites  Extremities: no CCE, deeply pigmented skin in lower legs.  Neuro/Psych:  Pleasant, no tremor, no asterixis.  Fully oriented.  Relaxed.   Intake/Output from previous day: 01/22 0701 - 01/23 0700 In: 1524 [P.O.:1020; I.V.:370; IV Piggyback:134] Out: 1975 [Urine:1500]  Intake/Output this shift: Total I/O In: -  Out: 100 [Urine:100]  Lab Results:  Recent Labs  08/24/13 0325 08/25/13 0215 08/26/13 0240  WBC 4.0 5.4 7.1  HGB 9.2* 9.4* 10.8*  HCT 27.9* 28.5* 32.7*  PLT 132* 139* 160   BMET  Recent Labs  08/24/13 0325 08/25/13 0715 08/26/13 0240  NA 140 144 142  K 5.0 4.0 4.0  CL 102 104 102  CO2 18* 23 20  GLUCOSE 184* 90 87  BUN 149* 96* 71*  CREATININE 4.66* 2.80* 2.02*  CALCIUM 9.1 9.1 9.5   LFT  Recent Labs  08/23/13 0914 08/23/13 1534 08/25/13 0715 08/26/13 0240  PROT 6.9 7.1  --  7.7  ALBUMIN 3.4* 3.4* 3.2* 3.4*  AST 19 19  --  21  ALT 26 25  --  23  ALKPHOS 89 96  --  75  BILITOT 0.9 0.6  --  2.4*   PT/INR  Recent Labs  08/25/13 0215 08/26/13 0240  LABPROT 19.9* 18.9*  INR 1.75* 1.63*   Hepatitis Panel  Recent Labs  08/25/13 1730  HEPBSAG NEGATIVE  HCVAB NEGATIVE  HEPAIGM NON REACTIVE  HEPBIGM NON REACTIVE     Studies/Results: US Abdomen Complete  08/24/2013   CLINICAL DATA:  Hepatomegaly.  EXAM: ULTRASOUND ABDOMEN COMPLETE  COMPARISON:  CT scan of the abdomen dated 06/09/2013  FINDINGS: Gallbladder:  There is sludge in the gallbladder. Gallbladder wall is normal. Negative sonographic Murphy's sign.  Common bile duct:  Diameter: 5.3 mm, normal.  Liver:  Diffuse hepatomegaly with increased echogenicity of the liver parenchyma, nodularity of the liver contour and increased echogenicity of periportal structures. No focal lesions.  IVC:  Normal.  Pancreas:  The head and body of the pancreas are normal. The tail of the pancreas is obscured by bowel.  Spleen:  Normal.  9.9 cm in length.  Right Kidney:  Length: 12.8 cm. Small stone in the upper pole. Increased echogenicity renal parenchyma consistent with renal medical disease.  Left Kidney:  Length: 14.4 cm. Increased echogenicity of the renal parenchyma consistent with renal medical disease.  Abdominal aorta:  Normal.  2.1 cm maximum diameter.  Other findings:  Diffuse moderate ascites.  IMPRESSION: 1. Hepatomegaly with findings suggestive of cirrhosis. 2. Moderate abdominal ascites. 3. Increased echogenicity of the renal parenchyma consistent with renal medical disease.   Electronically Signed   By: [name]  M.D.   On: 08/24/2013 14:30   Dg Chest Port 1 View  08/25/2013   CLINICAL DATA:  Dyspnea and hypoxia and hypotension  EXAM: PORTABLE CHEST - 1 VIEW  COMPARISON:  Portable chest x-ray of August 23, 2013  FINDINGS: The lungs are reasonably well inflated. The interstitial markings have increased in conspicuity, bilaterally. The cardiopericardial silhouette is mildly enlarged. The pulmonary vascularity is less distinct and appears mildly engorged. There is no pleural effusion. The patient has undergone previous CABG.  IMPRESSION: The findings are consistent with congestive heart failure with pulmonary interstitial edema. There is no pleural effusion or  alveolar infiltrate.   Electronically Signed   By: David  Martinique   On: 08/25/2013 16:10    ASSESMENT:   *  GI bleed:  Tarry stools EGD 1/22: grade 1 esophagitis, mild diffuse gastritis, no varices, H Pylori/biopsy pending.  Colonoscopy with HP polyp 2001. [facility] Seeds is GI MD.    *  Normocytic anemia.  Has not required blood.  Baseline hgb 11 to 12.   *  New diagnoses of cirrhosis. Hepato and splenomegaly. Not a heavy drinker, never previously diagnosed with fatty liver or hx obesity.    Labs sent to rule out wilson's disease, autoimmune causes, hemochromatosis.  Hepatitis serologies all negative, ferritin in normal range negating viral and hemochromatosis as etiology.   *  Ascites. Paracentesis ordered for today, to rule out SBP.  Low suspicion for this.   *  ARF on CKD, with hyperkalemia, AG acidosis, improved.   *  Chronic Coumadin for A fib. INR max 4.0, received Vit K.   *  CHF.  EF 35 to 40%, moderate MVR and tricuspid regurge.   *  Bladder wall tumor.  Cysto/biopsy delayed since 06/2013 due to LE cellulitis.    PLAN   *  Await pending labs to try to define etiology of cirrhosis. *  When/if Coumadin restarted will need to be vigilant about INR since liver disease may cause iatrogennic coagulopathy.     Azucena Freed  08/26/2013, 8:26 AM Pager: [phone] Attending MD note:   I have taken a history, , and reviewed the chart. I agree with the Advanced Practitioner's impression and recommendations. OK to restart Coumadin from GI standpoint. Will sigh off.  Melburn [facility] Pager # [phone]

## 2013-08-25 NOTE — Progress Notes (Signed)
[name] Progress Note   Subjective:   Feeling "better" this am. No complaints this am.  No SOB, chest pain, increasing edema.    Objective:   BP 104/53  Pulse 76  Temp(Src) 98.2 F (36.8 C) (Oral)  Resp 12  Ht 5\' 9"  (1.753 m)  Wt 154 lb 1.6 oz (69.9 kg)  BMI 22.75 kg/m2  SpO2 99%  Intake/Output Summary (Last 24 hours) at 08/25/13 1118 Last data filed at 08/25/13 1100  Gross per 24 hour  Intake   1300 ml  Output   5075 ml  Net  -3775 ml   Weight change: -14 lb 12.3 oz (-6.7 kg)  Physical Exam: Exam: General: resting comfortably in bed, NAD.  Cardiovascular: RRR. 2/6 Systolic murmur heard throughout the precordium Respiratory: Crackles and wheezing noted this am. Abdomen: soft, nontender. +distension. Extremities: Trace LE edema.  Skin: Bruising noted of upper extremities. Hyperpigmentation noted of lower extremities.  Neuro: No focal deficits.  Imaging: US Abdomen Complete  08/24/2013   CLINICAL DATA:  Hepatomegaly.  EXAM: ULTRASOUND ABDOMEN COMPLETE  COMPARISON:  CT scan of the abdomen dated 06/09/2013  FINDINGS: Gallbladder:  There is sludge in the gallbladder. Gallbladder wall is normal. Negative sonographic Murphy's sign.  Common bile duct:  Diameter: 5.3 mm, normal.  Liver:  Diffuse hepatomegaly with increased echogenicity of the liver parenchyma, nodularity of the liver contour and increased echogenicity of periportal structures. No focal lesions.  IVC:  Normal.  Pancreas:  The head and body of the pancreas are normal. The tail of the pancreas is obscured by bowel.  Spleen:  Normal.  9.9 cm in length.  Right Kidney:  Length: 12.8 cm. Small stone in the upper pole. Increased echogenicity renal parenchyma consistent with renal medical disease.  Left Kidney:  Length: 14.4 cm. Increased echogenicity of the renal parenchyma consistent with renal medical disease.  Abdominal aorta:  Normal.  2.1 cm maximum diameter.  Other findings:  Diffuse moderate ascites.   IMPRESSION: 1. Hepatomegaly with findings suggestive of cirrhosis. 2. Moderate abdominal ascites. 3. Increased echogenicity of the renal parenchyma consistent with renal medical disease.   Electronically Signed   By: [name] M.D.   On: 08/24/2013 14:30   US Renal  08/23/2013   CLINICAL DATA:  Acute renal failure.  Evaluate for hydronephrosis.  EXAM: RENAL/URINARY TRACT ULTRASOUND COMPLETE  COMPARISON:  06/09/2013  FINDINGS: Right Kidney:  Length: 12.9 cm. 4 mm echogenic structure in the right kidney upper pole is suggestive for a stone. No evidence for hydronephrosis.  Left Kidney:  Length: 13.4 cm. Echogenicity within normal limits. No mass or hydronephrosis visualized.  Bladder:  There is fluid in the urinary bladder. Urinary bladder wall appears to be thickened and similar to the prior CT.  Other findings:  Large amount of ascites throughout the abdomen.  IMPRESSION: Negative for hydronephrosis.  Large amount of ascites.  Right kidney stone.  Bladder wall thickening.   Electronically Signed   By: [name] M.D.   On: 08/23/2013 21:42   Dg Chest Port 1 View  08/23/2013   CLINICAL DATA:  Shortness of breath  EXAM: PORTABLE CHEST - 1 VIEW  COMPARISON:  Prior radiograph from 07/29/2013  FINDINGS: Median sternotomy wires with underlying CABG markers and surgical clips again seen. Cardiomegaly is stable.  Lungs are normally inflated. No focal infiltrate to suggest an acute infectious pneumonitis identified. Overall pulmonary vascularity is mildly increased without overt pulmonary edema. Minimal blunting of the right costophrenic angle is  suggestive of a small right pleural effusion. No pneumothorax.  Osseous structures are unchanged.  IMPRESSION: 1. Stable cardiomegaly with mild diffuse pulmonary vascular congestion without overt pulmonary edema. No focal infiltrates identified. 2. Small right pleural effusion.   Electronically Signed   By: [name] M.D.   On: 08/23/2013 16:12    Labs: BMET  Recent Labs Lab 08/23/13 0914 08/23/13 1534 08/23/13 2207 08/24/13 0325 08/25/13 0715  NA 134* 136* 141 140 144  K 6.7* 6.3* 5.8* 5.0 4.0  CL 105 100 103 102 104  CO2 18* 15* 16* 18* 23  GLUCOSE 150* 154* 100* 184* 90  BUN 156* 156* 148* 149* 96*  CREATININE 5.9* 5.22* 4.96* 4.66* 2.80*  CALCIUM 9.1 9.3 9.9 9.1 9.1  PHOS  --   --   --   --  4.5   CBC  Recent Labs Lab 08/23/13 0828 08/23/13 1534 08/23/13 1855 08/24/13 0325 08/25/13 0215  WBC 4.7 4.0  --  4.0 5.4  NEUTROABS 3.5 2.8  --   --   --   HGB 9.6* 9.5* 9.9* 9.2* 9.4*  HCT 28.9* 28.4* 29.2* 27.9* 28.5*  MCV 88.9 88.8  --  90.0 90.2  PLT 123.0* 146*  --  132* 139*    Medications:    . amiodarone  200 mg Oral q morning - 10a  . atorvastatin  20 mg Oral QHS  . ferumoxytol  1,020 mg Intravenous Once  . insulin aspart  0-15 Units Subcutaneous Q4H  . isosorbide mononitrate  30 mg Oral QHS  . pantoprazole (PROTONIX) IV  40 mg Intravenous Q12H  . pneumococcal 23 valent vaccine  0.5 mL Intramuscular Tomorrow-1000  . sodium bicarbonate  650 mg Oral BID    Assessment/ Plan:   Patient is a 73 y.o. male with a PMHx of CKD III (Baseline creatinine of ~ 1.4), DM-2, CHF (EF 25-30%), CAD, HTN, HLD, Afib, and COPD who was admitted to [facility] on 08/23/2013 for GI bleed and acute renal failure.   1) GI bleed  2) Acute on chronic renal failure - Likely secondary to poor perfusion in the setting of intravascular depletion secondary to CHF, diuretics and blood loss.  Rapid improvement.  ?secondary complic 3) Acute on chronic systolic CHF 4) Hx of bladder tumor - No obstruction on Renal US. 5) Hx of AF, on Coumadin - INR elevated to 4 on admission  6) Hyperkalemia  7) AG acidosis  8) DM-2  9) HTN  Plan:  GI bleed  - Patient to go for EGD today.  - INR 1.75 and Hb stable.  Acute on chronic renal failure - Likely secondary to poor perfusion in the setting of intravascular depletion secondary to CHF,  blood  loss, hypoperfusion. Baseline creatinine ~1.4. UA unremarkable. Renal US revealed NO obstruction, but a large of amount of ascites present - Creatinine/GFR improved this am.   - Iron studies returned showing Low % Saturation of 16.  Will treat with Feraheme today. - Continuing holding home BP medications.  - Patient doing well currently and has improved considerably. - Will continue to allow equilibration - Will follow closely.  Coral Spikes, DO PGY-2, [facility] 08/25/2013, 11:18 AM I have seen and examined this patient and agree with the plan of care seen, eval, counseled, discussed with patient and resident. .  [name] L 08/25/2013, 9:15 PM

## 2013-08-26 ENCOUNTER — Encounter (HOSPITAL_COMMUNITY): Payer: Self-pay | Admitting: Internal Medicine

## 2013-08-26 ENCOUNTER — Inpatient Hospital Stay (HOSPITAL_COMMUNITY): Payer: Medicare HMO

## 2013-08-26 DIAGNOSIS — E875 Hyperkalemia: Secondary | ICD-10-CM | POA: Diagnosis not present

## 2013-08-26 DIAGNOSIS — K7460 Unspecified cirrhosis of liver: Secondary | ICD-10-CM | POA: Diagnosis not present

## 2013-08-26 DIAGNOSIS — K209 Esophagitis, unspecified without bleeding: Secondary | ICD-10-CM | POA: Diagnosis not present

## 2013-08-26 LAB — PROTEIN, BODY FLUID: Total protein, fluid: 4.3 g/dL

## 2013-08-26 LAB — COMPREHENSIVE METABOLIC PANEL
ALBUMIN: 3.4 g/dL — AB (ref 3.5–5.2)
ALT: 23 U/L (ref 0–53)
AST: 21 U/L (ref 0–37)
Alkaline Phosphatase: 75 U/L (ref 39–117)
BUN: 71 mg/dL — ABNORMAL HIGH (ref 6–23)
CO2: 20 mEq/L (ref 19–32)
Calcium: 9.5 mg/dL (ref 8.4–10.5)
Chloride: 102 mEq/L (ref 96–112)
Creatinine, Ser: 2.02 mg/dL — ABNORMAL HIGH (ref 0.50–1.35)
GFR calc Af Amer: 36 mL/min — ABNORMAL LOW (ref >90)
GFR calc non Af Amer: 31 mL/min — ABNORMAL LOW (ref >90)
Glucose, Bld: 87 mg/dL (ref 70–99)
POTASSIUM: 4 meq/L (ref 3.7–5.3)
SODIUM: 142 meq/L (ref 137–147)
TOTAL PROTEIN: 7.7 g/dL (ref 6.0–8.3)
Total Bilirubin: 2.4 mg/dL — ABNORMAL HIGH (ref 0.3–1.2)

## 2013-08-26 LAB — BODY FLUID CELL COUNT WITH DIFFERENTIAL
EOS FL: 0 %
Lymphs, Fluid: 2 %
Monocyte-Macrophage-Serous Fluid: 97 % — ABNORMAL HIGH (ref 50–90)
Neutrophil Count, Fluid: 1 % (ref 0–25)
Other Cells, Fluid: 0 %
Total Nucleated Cell Count, Fluid: 201 cu mm (ref 0–1000)

## 2013-08-26 LAB — GLUCOSE, CAPILLARY
Glucose-Capillary: 150 mg/dL — ABNORMAL HIGH (ref 70–99)
Glucose-Capillary: 203 mg/dL — ABNORMAL HIGH (ref 70–99)
Glucose-Capillary: 137 mg/dL — ABNORMAL HIGH (ref 70–99)
Glucose-Capillary: 305 mg/dL — ABNORMAL HIGH (ref 70–99)
Glucose-Capillary: 401 mg/dL — ABNORMAL HIGH (ref 70–99)

## 2013-08-26 LAB — CBC
HCT: 32.7 % — ABNORMAL LOW (ref 39.0–52.0)
Hemoglobin: 10.8 g/dL — ABNORMAL LOW (ref 13.0–17.0)
MCH: 30.1 pg (ref 26.0–34.0)
MCHC: 33 g/dL (ref 30.0–36.0)
MCV: 91.1 fL (ref 78.0–100.0)
Platelets: 160 10*3/uL (ref 150–400)
RBC: 3.59 MIL/uL — ABNORMAL LOW (ref 4.22–5.81)
RDW: 17.7 % — AB (ref 11.5–15.5)
WBC: 7.1 10*3/uL (ref 4.0–10.5)

## 2013-08-26 LAB — ALBUMIN, FLUID (OTHER): Albumin, Fluid: 2.3 g/dL

## 2013-08-26 LAB — ANA: Anti Nuclear Antibody(ANA): NEGATIVE

## 2013-08-26 LAB — PROTIME-INR
INR: 1.63 — AB (ref 0.00–1.49)
PROTHROMBIN TIME: 18.9 s — AB (ref 11.6–15.2)

## 2013-08-26 LAB — MITOCHONDRIAL ANTIBODIES: Mitochondrial M2 Ab, IgG: 0.31 (ref <0.91)

## 2013-08-26 MED ORDER — INSULIN ASPART 100 UNIT/ML ~~LOC~~ SOLN
5.0000 [IU] | Freq: Once | SUBCUTANEOUS | Status: AC
  Administered 2013-08-26: 5 [IU] via SUBCUTANEOUS

## 2013-08-26 MED ORDER — INSULIN ASPART 100 UNIT/ML ~~LOC~~ SOLN
0.0000 [IU] | Freq: Three times a day (TID) | SUBCUTANEOUS | Status: DC
  Administered 2013-08-26: 11 [IU] via SUBCUTANEOUS
  Administered 2013-08-27 (×2): 3 [IU] via SUBCUTANEOUS
  Administered 2013-08-27: 8 [IU] via SUBCUTANEOUS
  Administered 2013-08-28: 3 [IU] via SUBCUTANEOUS
  Administered 2013-08-28: 8 [IU] via SUBCUTANEOUS
  Administered 2013-08-28 – 2013-08-29 (×2): 2 [IU] via SUBCUTANEOUS
  Administered 2013-08-29: 5 [IU] via SUBCUTANEOUS
  Administered 2013-08-29: 3 [IU] via SUBCUTANEOUS
  Administered 2013-08-30: 15 [IU] via SUBCUTANEOUS
  Administered 2013-08-30: 8 [IU] via SUBCUTANEOUS
  Administered 2013-08-31: 3 [IU] via SUBCUTANEOUS
  Administered 2013-08-31: 8 [IU] via SUBCUTANEOUS
  Administered 2013-08-31: 5 [IU] via SUBCUTANEOUS
  Administered 2013-09-01: 3 [IU] via SUBCUTANEOUS
  Administered 2013-09-01: 5 [IU] via SUBCUTANEOUS
  Administered 2013-09-02: 2 [IU] via SUBCUTANEOUS
  Administered 2013-09-02: 3 [IU] via SUBCUTANEOUS

## 2013-08-26 MED ORDER — INSULIN ASPART 100 UNIT/ML ~~LOC~~ SOLN: 0.0000 [IU] | Freq: Three times a day (TID) | SUBCUTANEOUS | Status: DC

## 2013-08-26 NOTE — Progress Notes (Signed)
[facility] Progress Note   Subjective:   Feeling much improved this am.  No complaints currently. He does not that his belly is "tight."    Objective:   BP 100/48  Pulse 117  Temp(Src) 98.8 F (37.1 C) (Oral)  Resp 26  Ht 5\' 9"  (1.753 m)  Wt 154 lb 1.6 oz (69.9 kg)  BMI 22.75 kg/m2  SpO2 95%  Intake/Output Summary (Last 24 hours) at 08/26/13 0615 Last data filed at 08/26/13 0445  Gross per 24 hour  Intake   1524 ml  Output   1975 ml  Net   -451 ml   Physical Exam: Exam: General: sitting up in bed this am. NAD.  Cardiovascular: RRR. 2/6 Systolic murmur heard throughout the precordium Respiratory: Diffuse crackles and wheezing on exam.  Abdomen: soft, nontender. +distension. Extremities: Trace LE edema.  Skin: Bruising noted of upper extremities. Hyperpigmentation noted of lower extremities.  Neuro: No focal deficits.  Imaging: US Abdomen Complete  08/24/2013   CLINICAL DATA:  Hepatomegaly.  EXAM: ULTRASOUND ABDOMEN COMPLETE  COMPARISON:  CT scan of the abdomen dated 06/09/2013  FINDINGS: Gallbladder:  There is sludge in the gallbladder. Gallbladder wall is normal. Negative sonographic Murphy's sign.  Common bile duct:  Diameter: 5.3 mm, normal.  Liver:  Diffuse hepatomegaly with increased echogenicity of the liver parenchyma, nodularity of the liver contour and increased echogenicity of periportal structures. No focal lesions.  IVC:  Normal.  Pancreas:  The head and body of the pancreas are normal. The tail of the pancreas is obscured by bowel.  Spleen:  Normal.  9.9 cm in length.  Right Kidney:  Length: 12.8 cm. Small stone in the upper pole. Increased echogenicity renal parenchyma consistent with renal medical disease.  Left Kidney:  Length: 14.4 cm. Increased echogenicity of the renal parenchyma consistent with renal medical disease.  Abdominal aorta:  Normal.  2.1 cm maximum diameter.  Other findings:  Diffuse moderate ascites.  IMPRESSION: 1. Hepatomegaly  with findings suggestive of cirrhosis. 2. Moderate abdominal ascites. 3. Increased echogenicity of the renal parenchyma consistent with renal medical disease.   Electronically Signed   By: [name] M.D.   On: 08/24/2013 14:30   Dg Chest Port 1 View  08/25/2013   CLINICAL DATA:  Dyspnea and hypoxia and hypotension  EXAM: PORTABLE CHEST - 1 VIEW  COMPARISON:  Portable chest x-ray of August 23, 2013  FINDINGS: The lungs are reasonably well inflated. The interstitial markings have increased in conspicuity, bilaterally. The cardiopericardial silhouette is mildly enlarged. The pulmonary vascularity is less distinct and appears mildly engorged. There is no pleural effusion. The patient has undergone previous CABG.  IMPRESSION: The findings are consistent with congestive heart failure with pulmonary interstitial edema. There is no pleural effusion or alveolar infiltrate.   Electronically Signed   By: David  Martinique   On: 08/25/2013 16:10   Labs: BMET  Recent Labs Lab 08/23/13 9476 08/23/13 1534 08/23/13 2207 08/24/13 0325 08/25/13 0715 08/26/13 0240  NA 134* 136* 141 140 144 142  K 6.7* 6.3* 5.8* 5.0 4.0 4.0  CL 105 100 103 102 104 102  CO2 18* 15* 16* 18* 23 20  GLUCOSE 150* 154* 100* 184* 90 87  BUN 156* 156* 148* 149* 96* 71*  CREATININE 5.9* 5.22* 4.96* 4.66* 2.80* 2.02*  CALCIUM 9.1 9.3 9.9 9.1 9.1 9.5  PHOS  --   --   --   --  4.5  --    CBC  Recent  Labs Lab 08/23/13 0828 08/23/13 1534 08/23/13 1855 08/24/13 0325 08/25/13 0215 08/26/13 0240  WBC 4.7 4.0  --  4.0 5.4 7.1  NEUTROABS 3.5 2.8  --   --   --   --   HGB 9.6* 9.5* 9.9* 9.2* 9.4* 10.8*  HCT 28.9* 28.4* 29.2* 27.9* 28.5* 32.7*  MCV 88.9 88.8  --  90.0 90.2 91.1  PLT 123.0* 146*  --  132* 139* 160    Medications:    . amiodarone  200 mg Oral q morning - 10a  . atorvastatin  20 mg Oral QHS  . insulin aspart  0-15 Units Subcutaneous Q4H  . pantoprazole (PROTONIX) IV  40 mg Intravenous Q12H  . sodium bicarbonate   650 mg Oral BID    Assessment/ Plan:   Patient is a 73 y.o. male with a PMHx of CKD III (Baseline creatinine of ~ 1.4), DM-2, CHF (EF 25-30%), CAD, HTN, HLD, Afib, and COPD who was admitted to [facility] on 08/23/2013 for GI bleed and acute renal failure.   1) GI bleed  2) Acute on chronic renal failure - Likely secondary to poor perfusion in the setting of intravascular depletion secondary to CHF, diuretics and blood loss.  Rapid improvement.  ?secondary complic 3) Acute on chronic systolic CHF 4) Hx of bladder tumor - No obstruction on Renal US. 5) Hx of AF, on Coumadin - INR elevated to 4 on admission  6) Hyperkalemia  7) AG acidosis  8) DM-2  9) HTN  Plan:  GI bleed, Splenomegaly, Hepatomegaly (concern for Cirrhosis)- GI following - s/p EGD (1/22). - EGD revealed grade 1 esophagitis and mild diffuse gastritis.  - Hb improving - (9.4 --> 10.8) - GI and primary managing.  Acute on chronic renal failure - Likely secondary to poor perfusion in the setting of intravascular depletion secondary to CHF,  blood loss, hypoperfusion. Baseline creatinine ~1.4. UA unremarkable. Renal US revealed NO obstruction. - Creatinine/GFR continuing to improving following equilibration after GI bleed. - Patient doing well currently and has improved considerably. - Will continue to hold BP meds are pressures are low. - Normal acid/base status; No hyperkalemia. - No additional concerns/issues at this time.  Nephrology will sign off today.  If any further concerns/questions feel free to call.   Anemia - worsened in setting of GI bleed - Iron studies returned showing Low % Saturation of 16. Patient given [facility] 1/22. - Hb improving as above.    Coral Spikes, DO PGY-2, [facility] 08/26/2013, 6:15 AM I have seen and examined this patient and agree with the plan of care seen, examined and eval. Hemodynamic AKI,, now better. High risk of recurrence.Do not do large vol paracentesis.  Has CKD  baseline .  Sapna Padron L 08/26/2013, 10:48 AM

## 2013-08-26 NOTE — Progress Notes (Signed)
CBGs on 1/22: [id number] mg/dl      1/23:  150-203-137 mg/dl Recommend patient start Levemir 10-15 units daily if CBGs continue to be greater than 180 mg/dl.  Patient takes Levemir 15 units daily at home. Also, consider placing patient on a CHO Modified Medium diet while in the hospital.  Harvel Ricks RN BSN CDE

## 2013-08-26 NOTE — Progress Notes (Signed)
[name] - Stepdown / ICU Progress Note  [name] TDV:[id number] DOB: 05/29/1941 DOA: [name]/20/2015 PCP: [name] Simpler, MD  Brief narrative: 73 year old male patient brought to the emergency department by EMS. 24 hours prior to presentation the patient had blood work done at his primary care physician's office which demonstrated marked hyperkalemia and other lab abnormalities. The primary care physician's office called EMS to have the patient transported to the hospital. After arrival the patient endorsed that he had been experiencing abdominal discomfort for 2 weeks with some nausea and vomiting and diarrhea. It was reported that at the primary care physician's office he had heme positive stool. Patient is on chronic Coumadin for atrial fibrillation. Endorsed on the date of presentation his stools were tarry in appearance. Laboratory data performed in the ER revealed INR elevated at 4.01. He was subsequently admitted to the ICU.  At admission he remained hemodynamically stable. He was given vitamin K to correct his coagulopathy. He was given Kayexalate to treat his hyperkalemia. His abdominal ultrasound was consistent with the diagnosis of cirrhosis and ascites. It was suspected he may have been hypervolemic with possible cardiorenal syndrome so he initially was given IV Lasix. Gastroenterology was consulted as well as Nephrology. Renal ultrasound revealed no evidence of obstruction. Patient has not required hemodialysis since admission.  His potassium was corrected and other labs remained stable or improving and therefore he was deemed appropriate to transfer out of the ICU.  Assessment/Plan:    Acute renal failure / Hyperkalemia / Metabolic acidosis -Multifactorial in etiology likely from poor perfusion in the setting of dehydration, low output congestive heart failure and concomitant use of diuretics as well as recent blood loss from GI bleed -BUN was markedly elevated at 149 with  a creatinine of 4.66 at presentation and now has trended downward -No indications for acute dialysis at this time -Negative balance of 6.7 L since admission -Nephrology signed off    Upper GI hemorrhage: A) Acute esophagitis B) Gastritis -Appreciate GI assistance -No further bleeding since admission -EGD [name]/22 revealed evidence of esophagitis but no varices-biopsy pending including H. pylori specimen -Continue PPI   Hypotension -Possibly a combination of low output heart failure but was aggressively diuresed at time of admission with greater than 6 L removed so this in setting of use of Imdur is likely precipitating factor therefore we'll discontinue nitrate for now -Was not orthostatic when checked    Cryptogenic Cirrhosis of liver with ascites -New finding-GI has ordered multiple hepatic serologies -Patient endorses only minimal alcohol intake so 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 -paracentesis planned for today to rule out SBP although patient currently does not have abdominal pain -Does have nontender periumbilical hernia which is protruding due to pressure from ascites    Warfarin-induced coagulopathy -Was given vitamin K and current INR is [name].75 -Defer to GI as to when will be appropriate to resume anticoagulation     COPD (chronic obstructive pulmonary disease) -Compensated without wheezing   Acute Hypoxemic respiratory failure -Appears to be more mechanical in nature at this point given abdominal distention for persistent ascites -Chest x-ray at time of admission only revealed mild diffuse pulmonary vascular congestion without overt pulmonary edema-repeat chest x-ray today.    Chronic systolic heart failure -Echocardiogram this admission revealed an EF of 35-40%-his EF has actually improved compared with echocardiogram from 2013 at which time EF was estimated between 25 and 30% -Of note right ventricle was normal despite patient's underlying COPD although he did  have moderate  tricuspid regurgitation so suspect may have a degree of pulmonary HTN-echo 2013 with moderate to severe pulmonary hypertension reading 62 mmHg    Acute blood loss anemia -Hemoglobin has remained stable around 9 -Iron saturation low >> Feraheme    CORONARY ARTERY DISEASE -Discontinued Imdur [name]/22 given blood pressures in the 80s     Atrial fibrillation -Continue amiodarone -Coumadin preadmission with current INR subtherapeutic in setting of recent GI bleeding    Hx of HTN  -BP still soft and given progression of renal dysfunction suspected to be related to hypoperfusion we'll continue to hold blood pressure medication    Type II or unspecified type diabetes mellitus with peripheral circulatory disorders, uncontrolled(250.72) -CBGs low -Continue sliding scale insulin   DVT prophylaxis: SCDs Code Status: Full Family Communication: No family at bedside Disposition Plan/Expected LOS: Transfer to Telemetry   Consultants: Nephrology Gastroenterology  Procedures: Echocardiogram - Left ventricle: The cavity size was normal. Wall thickness was normal. Systolic function was moderately reduced. The estimated ejection fraction was in the range of 35% to 40%. There is hypokinesis of the inferior myocardium. - Mitral valve: Calcified annulus. Mildly thickened leaflets . Moderate regurgitation. - Left atrium: The atrium was moderately dilated. - Right atrium: The atrium was mildly to moderately dilated. - Tricuspid valve: Moderate regurgitation.  EGD grade [name] esophagitis  No esophageal varices  Mild diffuse antral gastritis. Status post biopsies to rule out H.  pylori  Antibiotics: None  HPI/Subjective: Patient alert. Denies current abdominal discomfort or any recurrence of GI bleeding symptoms. No chest pain or shortness of breath..  Objective: Blood pressure 111/64, pulse 114, temperature 98.7 F (37.[name] C), temperature source Oral, resp. rate 20, height 5\' 9"  ([name].753 m), weight  154 lb [name].6 oz (69.9 kg), SpO2 98.00%.  Intake/Output Summary (Last 24 hours) at 08/26/13 1236 Last data filed at 08/26/13 1230  Gross per 24 hour  Intake   1444 ml  Output   1825 ml  Net   -381 ml   Exam: General: No acute respiratory distress Lungs: Clear to auscultation bilaterally without wheezes or crackles, RA Cardiovascular: Irregular rate and rhythm without murmur gallop or rub normal S1 and S2, no peripheral edema -no peripheral edema Abdomen: Nontender, markedly distended but soft, nontender periumbilical hernia bowel sounds positive, no rebound, no ascites, no appreciable mass Musculoskeletal: No significant cyanosis, clubbing of bilateral lower extremities Neurological: Alert and oriented x 3, moves all extremities x 4 without focal neurological deficits, CN 2-12 intact  Scheduled Meds:  Scheduled Meds: . amiodarone  200 mg Oral q morning - 10a  . atorvastatin  20 mg Oral QHS  . insulin aspart  0-15 Units Subcutaneous Q4H  . pantoprazole (PROTONIX) IV  40 mg Intravenous Q12H  . sodium bicarbonate  650 mg Oral BID    Data Reviewed: Basic Metabolic Panel:  Recent Labs Lab 08/23/13 1534 08/23/13 2207 08/24/13 0325 08/25/13 0715 08/26/13 0240  NA 136* 141 140 144 142  K 6.3* 5.8* 5.0 4.0 4.0  CL 100 103 102 104 102  CO2 15* 16* 18* 23 20  GLUCOSE 154* 100* 184* 90 87  BUN 156* 148* 149* 96* 71*  CREATININE 5.22* 4.96* 4.66* 2.80* 2.02*  CALCIUM 9.3 9.9 9.[name] 9.[name] 9.5  PHOS  --   --   --  4.5  --    Liver Function Tests:  Recent Labs Lab 08/23/13 0914 08/23/13 1534 08/25/13 0715 08/26/13 0240  AST 19 19  --  21  ALT  26 25  --  23  ALKPHOS 89 96  --  75  BILITOT 0.9 0.6  --  2.4*  PROT 6.9 7.[name]  --  7.7  ALBUMIN 3.4* 3.4* 3.2* 3.4*    Recent Labs Lab 08/23/13 1534  LIPASE 71*    Recent Labs Lab 08/23/13 2207  AMMONIA 45   CBC:  Recent Labs Lab 08/23/13 0828 08/23/13 1534 08/23/13 1855 08/24/13 0325 08/25/13 0215 08/26/13 0240  WBC  4.7 4.0  --  4.0 5.4 7.[name]  NEUTROABS 3.5 2.8  --   --   --   --   HGB 9.6* 9.5* 9.9* 9.2* 9.4* 10.8*  HCT 28.9* 28.4* 29.2* 27.9* 28.5* 32.7*  MCV 88.9 88.8  --  90.0 90.2 91.[name]  PLT 123.0* 146*  --  132* 139* 160   BNP (last 3 results)  Recent Labs  11/29/12 0655 06/03/13 2108 08/23/13 1534  PROBNP 1278.0* 1390.0* 2953.0*   CBG:  Recent Labs Lab 08/25/13 2032 08/25/13 2350 08/26/13 0419 08/26/13 0814 08/26/13 1141  GLUCAP 231* 76 150* 203* 137*    Recent Results (from the past 240 hour(s))  MRSA PCR SCREENING     Status: None   Collection Time    08/23/13 10:45 PM      Result Value Range Status   MRSA by PCR NEGATIVE  NEGATIVE Final   Comment:            The GeneXpert MRSA Assay (FDA     approved for NASAL specimens     only), is one component of a     comprehensive MRSA colonization     surveillance program. It is not     intended to diagnose MRSA     infection nor to guide or     monitor treatment for     MRSA infections.     Studies:  Recent x-ray studies have been reviewed in detail by the Attending Physician  Time spent :     Erin Hearing, [facility] Office  [phone] Pager [phone]  **If unable to reach the above provider after paging please contact the [facility] @ [phone]  On-Call/Text Page:      [email or web address]      password [email or web address]  If 7PM-7AM, please contact night-coverage [email or web address] Password [email or web address] [name]/23/2015, 12:36 PM   LOS: 3 days   I have personally examined this patient and reviewed the entire database. I have reviewed the above note, made any necessary editorial changes, and agree with its content.  Cherene Altes, MD [facility]

## 2013-08-26 NOTE — Procedures (Signed)
Successful US guided paracentesis from RLQ.  Yielded 6 liters of cloudy yellow/amber fluid.  No immediate complications.  Pt tolerated well.   Specimen was sent for labs.  Tsosie Billing D PA-C 08/26/2013 2:07 PM

## 2013-08-27 DIAGNOSIS — E875 Hyperkalemia: Secondary | ICD-10-CM | POA: Diagnosis not present

## 2013-08-27 LAB — GLUCOSE, CAPILLARY
GLUCOSE-CAPILLARY: 194 mg/dL — AB (ref 70–99)
Glucose-Capillary: 174 mg/dL — ABNORMAL HIGH (ref 70–99)
Glucose-Capillary: 275 mg/dL — ABNORMAL HIGH (ref 70–99)
Glucose-Capillary: 271 mg/dL — ABNORMAL HIGH (ref 70–99)

## 2013-08-27 LAB — CBC
HCT: 32.5 % — ABNORMAL LOW (ref 39.0–52.0)
Hemoglobin: 10.8 g/dL — ABNORMAL LOW (ref 13.0–17.0)
MCH: 30 pg (ref 26.0–34.0)
MCHC: 33.2 g/dL (ref 30.0–36.0)
MCV: 90.3 fL (ref 78.0–100.0)
Platelets: 152 10*3/uL (ref 150–400)
RBC: 3.6 MIL/uL — ABNORMAL LOW (ref 4.22–5.81)
RDW: 17.7 % — AB (ref 11.5–15.5)
WBC: 5.3 10*3/uL (ref 4.0–10.5)

## 2013-08-27 LAB — COMPREHENSIVE METABOLIC PANEL
ALBUMIN: 3 g/dL — AB (ref 3.5–5.2)
ALT: 27 U/L (ref 0–53)
AST: 44 U/L — AB (ref 0–37)
Alkaline Phosphatase: 100 U/L (ref 39–117)
BUN: 60 mg/dL — ABNORMAL HIGH (ref 6–23)
CHLORIDE: 100 meq/L (ref 96–112)
CO2: 23 mEq/L (ref 19–32)
CREATININE: 1.88 mg/dL — AB (ref 0.50–1.35)
Calcium: 8.5 mg/dL (ref 8.4–10.5)
GFR calc Af Amer: 40 mL/min — ABNORMAL LOW (ref >90)
GFR calc non Af Amer: 34 mL/min — ABNORMAL LOW (ref >90)
Glucose, Bld: 160 mg/dL — ABNORMAL HIGH (ref 70–99)
Potassium: 3.7 mEq/L (ref 3.7–5.3)
Sodium: 139 mEq/L (ref 137–147)
Total Bilirubin: 2 mg/dL — ABNORMAL HIGH (ref 0.3–1.2)
Total Protein: 7 g/dL (ref 6.0–8.3)

## 2013-08-27 LAB — ALPHA-1-ANTITRYPSIN: A1 ANTITRYPSIN SER: 196 mg/dL (ref 90–200)

## 2013-08-27 LAB — PROTIME-INR
INR: 1.61 — AB (ref 0.00–1.49)
Prothrombin Time: 18.7 seconds — ABNORMAL HIGH (ref 11.6–15.2)

## 2013-08-27 LAB — CERULOPLASMIN: CERULOPLASMIN: 29 mg/dL (ref 20–60)

## 2013-08-27 MED ORDER — SPIRONOLACTONE 25 MG PO TABS
25.0000 mg | ORAL_TABLET | Freq: Every day | ORAL | Status: DC
  Administered 2013-08-27 – 2013-09-02 (×7): 25 mg via ORAL
  Filled 2013-08-27 (×7): qty 1

## 2013-08-27 MED ORDER — INSULIN DETEMIR 100 UNIT/ML ~~LOC~~ SOLN
15.0000 [IU] | Freq: Every day | SUBCUTANEOUS | Status: DC
  Administered 2013-08-27 – 2013-08-29 (×3): 15 [IU] via SUBCUTANEOUS
  Filled 2013-08-27 (×4): qty 0.15

## 2013-08-27 MED ORDER — PANTOPRAZOLE SODIUM 40 MG PO TBEC
40.0000 mg | DELAYED_RELEASE_TABLET | Freq: Two times a day (BID) | ORAL | Status: DC
  Administered 2013-08-27 – 2013-09-02 (×12): 40 mg via ORAL
  Filled 2013-08-27 (×12): qty 1

## 2013-08-27 NOTE — Progress Notes (Signed)
Pt transported by wheelchair to 3E19; all belongings taken; pt alert and oriented; no complications noted

## 2013-08-27 NOTE — Progress Notes (Signed)
Patient came to the floor 1450, alert oriented, v/s at base line, no c/o pain.Lung diminish with exp. Wheezes bilateral . 12 lead ekg done to confirm rhythm, ekg show to MD. No new order given will continue to monitor patient.

## 2013-08-27 NOTE — Progress Notes (Signed)
[name] Civil GUY:[id number] DOB: 06-Feb-1941 DOA: 08/23/2013 PCP: [name], MD  Brief narrative: 73 year old male patient brought to the ED by EMS. 24 hours prior to presentation the patient had blood work done at his PCP's office which demonstrated marked hyperkalemia and other lab abnormalities. The primary care physician's office called EMS to have the patient transported to the hospital. After arrival the patient endorsed that he had been experiencing abdominal discomfort for 2 weeks with some nausea and vomiting and diarrhea. It was reported that he had heme positive stool. Patient was on chronic Coumadin for atrial fibrillation. Pt reported his stools were tarry in appearance. Laboratory data in the ER revealed INR elevated at 4.01. He was subsequently admitted to the ICU.  At admission he remained hemodynamically stable. He was given vitamin K to correct his coagulopathy. He was given Kayexalate to treat his hyperkalemia. His abdominal ultrasound was consistent with cirrhosis and ascites. It was suspected he may have been hypervolemic with possible cardiorenal syndrome so he initially was given IV Lasix. Gastroenterology was consulted as well as Nephrology. Renal ultrasound revealed no evidence of obstruction. Patient has not required hemodialysis.  His potassium was corrected and other labs remained stable or improving and therefore he was deemed appropriate to transfer out of the ICU.  HPI/Subjective: Feels "much better" today.  No dizziness or HA.  No cp, sob, or abdom pain.  Assessment/Plan:  Cryptogenic Cirrhosis of liver with ascites - Amiodarone? -relatively new finding - liver "normal" via CT scan 2001 - [place] of liver March 2014 noted enlarged liver but no parenchymal findings noted - was being worked up by PCP  -patient endorses only minimal alcohol intake and hx appears reliable  -paracentesis fluid not convincing for  SBP -ruled out wilson's disease w/ normal ceruloplasmin   -ANA negative - mitochondrial ab normal - anti-smooth muscle ab pending  -alpha-1-antitrypsin level normal  -ferritin normal therefore not hemochromatosis -viral hepatitis studies negative -I am suspicious that Amio may be the offending agent - I don't feel we can afford to continue its use - follow on tele w/ d/c of amio - recovery, if happens, will be slow if this is due to amio - if amio is felt to be absolutely required, pt will need a liver bx before resuming to prove amio is not the culprit  Acute renal failure / Hyperkalemia / Metabolic acidosis -Multifactorial: low output congestive heart failure, use of diuretics, & recent blood loss from GI bleed -BUN was markedly elevated at 149 with a creatinine of 4.66 at presentation  -crt continues to slowly improve   Mild upper GI hemorrhage: A) Acute esophagitis B) Gastritis -Appreciate GI assistance -No further bleeding since admission -EGD 1/22 revealed evidence of esophagitis but no varices - biopsy pending including H. pylori specimen -Continue PPI  Acute blood loss anemia -Hemoglobin has remained stable  -Iron saturation low >> Feraheme  Hypotension -Possibly a combination of low output heart failure + was aggressively diuresed at time of admission with greater than 6 L removed in setting of Imdur -Was not orthostatic when checked  Warfarin-induced coagulopathy + cirrhosis related coagulopathy  -Was given vitamin K at admission - INR has not improved beyond 1.6 - suspect has underlying coagulopathy due to liver disease - will not resume coumadin for now - follow INR    COPD  -Compensated without wheezing  Acute Hypoxemic respiratory failure -Appears to be mechanical in nature  given abdominal distention for persistent ascites -Chest x-ray at time of admission only revealed mild diffuse pulmonary vascular congestion without overt pulmonary edema  Chronic systolic  heart failure -Echocardiogram this admission revealed EF of 35-40% - his EF has actually improved compared with echocardiogram from 2013 at which time EF was 25-30% -Of note right ventricle was normal despite patient's underlying COPD although he did have moderate tricuspid regurgitation - echo 2013 with moderate to severe pulmonary hypertension reading 62 mmHg  CORONARY ARTERY DISEASE s/p CABG 1996 -Discontinued Imdur 1/22 given blood pressures in the 80s   Atrial fibrillation s/p ablation  -discontinue amiodarone as above  -Coumadin preadmission not to be resumed for now   Hx of HTN  -BP still soft   Type II diabetes mellitus with peripheral circulatory disorders, uncontrolled -CBGs low -Continue sliding scale insulin  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 Wall thickness was normal. Systolic function was moderately reduced. The estimated ejection fraction was in the range of 35% to 40%. There is hypokinesis of the inferior myocardium. - Mitral valve: Calcified annulus. Mildly thickened leaflets . Moderate regurgitation. - Left atrium: The atrium was moderately dilated. - Right atrium: The atrium was mildly to moderately dilated. - Tricuspid valve: Moderate regurgitation.  EGD grade 1 esophagitis  No esophageal varices  Mild diffuse antral gastritis. Status post biopsies to rule out H.  pylori  Antibiotics: None  Objective: Blood pressure 98/52, pulse 91, temperature 98.9 F (37.2 C), temperature source Oral, resp. rate 22, height 5\' 9"  (1.753 m), weight 67.5 kg (148 lb 13 oz), SpO2 95.00%.  Intake/Output Summary (Last 24 hours) at 08/27/13  1125 Last data filed at 08/27/13 1046  Gross per 24 hour  Intake    600 ml  Output   1150 ml  Net   -550 ml   Exam: General: No acute respiratory distress - alert and oriented  Lungs: Clear to auscultation bilaterally without wheezes or crackles Cardiovascular: Irregular rate and rhythm without murmur gallop or rub - no peripheral edema Abdomen: Nontender, less distended, soft, nontender periumbilical hernia, bowel sounds positive, no rebound, no appreciable mass Musculoskeletal: No significant cyanosis, clubbing of bilateral lower extremities Neurological: Alert and oriented x 3, moves all extremities x 4 without focal neurological deficits, CN 2-12 intact  Scheduled Meds:  Scheduled Meds: . insulin aspart  0-15 Units Subcutaneous TID WC  . pantoprazole  40 mg Oral BID  . sodium bicarbonate  650 mg Oral BID    Data Reviewed: Basic Metabolic Panel:  Recent Labs Lab 08/23/13 2207 08/24/13 0325 08/25/13 0715 08/26/13 0240 08/27/13 0330  NA 141 140 144 142 139  K 5.8* 5.0 4.0 4.0 3.7  CL 103 102 104 102 100  CO2 16* 18* 23 20 23   GLUCOSE 100* 184* 90 87 160*  BUN 148* 149* 96* 71* 60*  CREATININE 4.96* 4.66* 2.80* 2.02* 1.88*  CALCIUM 9.9 9.1 9.1 9.5 8.5  PHOS  --   --  4.5  --   --    Liver Function Tests:  Recent Labs Lab 08/23/13 0914 08/23/13 1534 08/25/13 0715 08/26/13 0240 08/27/13 0330  AST 19 19  --  21 44*  ALT 26 25  --  23 27  ALKPHOS 89  96  --  75 100  BILITOT 0.9 0.6  --  2.4* 2.0*  PROT 6.9 7.1  --  7.7 7.0  ALBUMIN 3.4* 3.4* 3.2* 3.4* 3.0*    Recent Labs Lab 08/23/13 1534  LIPASE 71*    Recent Labs Lab 08/23/13 2207  AMMONIA 45   CBC:  Recent Labs Lab 08/23/13 0828 08/23/13 1534 08/23/13 1855 08/24/13 0325 08/25/13 0215 08/26/13 0240 08/27/13 0330  WBC 4.7 4.0  --  4.0 5.4 7.1 5.3  NEUTROABS 3.5 2.8  --   --   --   --   --   HGB 9.6* 9.5* 9.9* 9.2* 9.4* 10.8* 10.8*  HCT 28.9* 28.4* 29.2* 27.9* 28.5* 32.7* 32.5*  MCV 88.9  88.8  --  90.0 90.2 91.1 90.3  PLT 123.0* 146*  --  132* 139* 160 152   BNP (last 3 results)  Recent Labs  11/29/12 0655 06/03/13 2108 08/23/13 1534  PROBNP 1278.0* 1390.0* 2953.0*   CBG:  Recent Labs Lab 08/26/13 0814 08/26/13 1141 08/26/13 1559 08/26/13 2014 08/27/13 0820  GLUCAP 203* 137* 305* 401* 174*    Recent Results (from the past 240 hour(s))  MRSA PCR SCREENING     Status: None   Collection Time    08/23/13 10:45 PM      Result Value Range Status   MRSA by PCR NEGATIVE  NEGATIVE Final   Comment:            The GeneXpert MRSA Assay (FDA     approved for NASAL specimens     only), is one component of a     comprehensive MRSA colonization     surveillance program. It is not     intended to diagnose MRSA     infection nor to guide or     monitor treatment for     MRSA infections.     Studies:  Recent x-ray studies have been reviewed in detail by the Attending Physician  Time spent : 19mins   Cherene Altes, MD [facility] Office  [phone] Pager [phone]  On-Call/Text Page:      [email or web address]      password [facility]  08/27/2013, 11:25 AM   LOS: 4 days

## 2013-08-28 DIAGNOSIS — E875 Hyperkalemia: Secondary | ICD-10-CM | POA: Diagnosis not present

## 2013-08-28 LAB — COMPREHENSIVE METABOLIC PANEL
ALT: 30 U/L (ref 0–53)
AST: 37 U/L (ref 0–37)
Albumin: 2.9 g/dL — ABNORMAL LOW (ref 3.5–5.2)
Alkaline Phosphatase: 111 U/L (ref 39–117)
BILIRUBIN TOTAL: 1.4 mg/dL — AB (ref 0.3–1.2)
BUN: 56 mg/dL — AB (ref 6–23)
CHLORIDE: 96 meq/L (ref 96–112)
CO2: 24 mEq/L (ref 19–32)
CREATININE: 1.89 mg/dL — AB (ref 0.50–1.35)
Calcium: 8.3 mg/dL — ABNORMAL LOW (ref 8.4–10.5)
GFR calc Af Amer: 40 mL/min — ABNORMAL LOW (ref >90)
GFR calc non Af Amer: 34 mL/min — ABNORMAL LOW (ref >90)
Glucose, Bld: 260 mg/dL — ABNORMAL HIGH (ref 70–99)
Potassium: 3.6 mEq/L — ABNORMAL LOW (ref 3.7–5.3)
Sodium: 135 mEq/L — ABNORMAL LOW (ref 137–147)
Total Protein: 7 g/dL (ref 6.0–8.3)

## 2013-08-28 LAB — CBC
HCT: 31.3 % — ABNORMAL LOW (ref 39.0–52.0)
Hemoglobin: 10.3 g/dL — ABNORMAL LOW (ref 13.0–17.0)
MCH: 29.9 pg (ref 26.0–34.0)
MCHC: 32.9 g/dL (ref 30.0–36.0)
MCV: 90.7 fL (ref 78.0–100.0)
Platelets: 161 10*3/uL (ref 150–400)
RBC: 3.45 MIL/uL — ABNORMAL LOW (ref 4.22–5.81)
RDW: 17.6 % — AB (ref 11.5–15.5)
WBC: 4.8 10*3/uL (ref 4.0–10.5)

## 2013-08-28 LAB — GLUCOSE, CAPILLARY
GLUCOSE-CAPILLARY: 130 mg/dL — AB (ref 70–99)
GLUCOSE-CAPILLARY: 215 mg/dL — AB (ref 70–99)
Glucose-Capillary: 256 mg/dL — ABNORMAL HIGH (ref 70–99)
Glucose-Capillary: 199 mg/dL — ABNORMAL HIGH (ref 70–99)

## 2013-08-28 LAB — AMMONIA: Ammonia: 10 umol/L — ABNORMAL LOW (ref 11–60)

## 2013-08-28 LAB — APTT: aPTT: 32 seconds (ref 24–37)

## 2013-08-28 LAB — PROTIME-INR
INR: 1.47 (ref 0.00–1.49)
PROTHROMBIN TIME: 17.4 s — AB (ref 11.6–15.2)

## 2013-08-28 MED ORDER — LEVALBUTEROL HCL 0.63 MG/3ML IN NEBU
0.6300 mg | INHALATION_SOLUTION | RESPIRATORY_TRACT | Status: DC | PRN
  Filled 2013-08-28: qty 3

## 2013-08-28 MED ORDER — LEVALBUTEROL HCL 0.63 MG/3ML IN NEBU
0.6300 mg | INHALATION_SOLUTION | Freq: Four times a day (QID) | RESPIRATORY_TRACT | Status: DC
  Administered 2013-08-28 – 2013-08-31 (×11): 0.63 mg via RESPIRATORY_TRACT
  Filled 2013-08-28 (×24): qty 3

## 2013-08-28 MED ORDER — ALBUTEROL SULFATE (2.5 MG/3ML) 0.083% IN NEBU: 2.5000 mg | INHALATION_SOLUTION | RESPIRATORY_TRACT | Status: DC | PRN

## 2013-08-28 MED ORDER — POTASSIUM CHLORIDE CRYS ER 20 MEQ PO TBCR
40.0000 meq | EXTENDED_RELEASE_TABLET | Freq: Once | ORAL | Status: AC
  Administered 2013-08-28: 40 meq via ORAL
  Filled 2013-08-28: qty 2

## 2013-08-28 NOTE — Evaluation (Signed)
Physical Therapy Evaluation Patient Details Name: [name] MRN: [id number] DOB: 02/18/1941 Today's Date: 08/28/2013 Time: 1425-1440 PT Time Calculation (min): 15 min  PT Assessment / Plan / Recommendation History of Present Illness  Pt admit with acute renal failure.  Clinical Impression  Pt admitted with above. Pt currently with functional limitations due to the deficits listed below (see PT Problem List). Pt should be ok to d/c home if obtains 4 wheeled Rw with seat and receives HHPT f/u.   Pt will benefit from skilled PT to increase their independence and safety with mobility to allow discharge to the venue listed below.     PT Assessment  Patient needs continued PT services    Follow Up Recommendations  Home health PT;Supervision/Assistance - 24 hour         Barriers to Discharge Decreased caregiver support pt cares for sister with dementia    Equipment Recommendations  Other (comment) (4 wheeled RW with seat)         Frequency Min 3X/week    Precautions / Restrictions Precautions Precautions: Fall Restrictions Weight Bearing Restrictions: No   Pertinent Vitals/Pain Sats >94% with ambulation on RA, DOE 3/4 , no pain      Mobility  Bed Mobility Overal bed mobility: Independent Transfers Overall transfer level: Needs assistance Transfers: Sit to/from Stand Sit to Stand: Min assist General transfer comment: Needs steadying assist as pt with poor balance reactions if perturbed or distracted.  Ambulation/Gait Ambulation/Gait assistance: Min assist Ambulation Distance (Feet): 200 Feet Assistive device: None Gait Pattern/deviations: Decreased stride length Gait velocity interpretation: <1.8 ft/sec, indicative of risk for recurrent falls General Gait Details: Pt with overall instability with occasional LOB which he attempts to correct but at times needs steadying due to overcorrecting.  Pt states he uses cane but really needs RW with seat.  Pt DOE3/4 at end of  walk and he states he gives out of breath alot.           PT Diagnosis: Generalized weakness  PT Problem List: Decreased activity tolerance;Decreased balance;Decreased mobility;Decreased knowledge of use of DME;Decreased safety awareness;Decreased knowledge of precautions PT Treatment Interventions: DME instruction;Gait training;Functional mobility training;Therapeutic activities;Therapeutic exercise;Balance training;Patient/family education     PT Goals(Current goals can be found in the care plan section) Acute Rehab PT Goals Patient Stated Goal: to go home PT Goal Formulation: With patient Time For Goal Achievement: 09/04/13 Potential to Achieve Goals: Good  Visit Information  Last PT Received On: 08/28/13 Assistance Needed: +1 History of Present Illness: Pt admit with acute renal failure.       Prior [place] expects to be discharged to:: Private residence Living Arrangements: Alone (helps take care of sister with alzheimer's during the day) Available Help at Discharge: Other (Comment) (helps his sister out during the day) Type of Home: House Home Access: Stairs to enter (5) Entrance Stairs-Number of Steps: 5 Home Layout: One level Home Equipment: Cane - single point (as needed to steady himself) Prior Function Level of Independence: Independent Comments: still driving, using a scooter around grocery store Communication Communication: No difficulties    Cognition  Cognition Arousal/Alertness: Awake/alert Behavior During Therapy: WFL for tasks assessed/performed Overall Cognitive Status: Within Functional Limits for tasks assessed    Extremity/Trunk Assessment Upper Extremity Assessment Upper Extremity Assessment: Defer to OT evaluation Lower Extremity Assessment Lower Extremity Assessment: Generalized weakness Cervical / Trunk Assessment Cervical / Trunk Assessment: Kyphotic   Balance Balance Overall balance assessment: Needs  assistance;History of Falls  Standing balance support: No upper extremity supported;During functional activity Standing balance-Leahy Scale: Fair High level balance activites: Turns;Direction changes High Level Balance Comments: needs steadying assist.  End of Session PT - End of Session Equipment Utilized During Treatment: Gait belt Activity Tolerance: Patient limited by fatigue Patient left: in bed;with call bell/phone within reach Nurse Communication: Mobility status       INGOLD,Kalany Diekmann 08/28/2013, 3:04 PM [facility] Acute Rehabilitation [phone] (pager)

## 2013-08-28 NOTE — Progress Notes (Signed)
[name] Progress Note  [name] SWF:[id number] DOB: Sep 09, 1940 DOA: 08/23/2013 PCP: [name] Simpler, MD  Brief narrative: 73 year old male patient brought to the ED by EMS. 24 hours prior to presentation the patient had blood work done at his PCP's office which demonstrated marked hyperkalemia and other lab abnormalities. The primary care physician's office called EMS to have the patient transported to the hospital. After arrival the patient endorsed that he had been experiencing abdominal discomfort for 2 weeks with some nausea and vomiting and diarrhea. It was reported that he had heme positive stool. Patient was on chronic Coumadin for atrial fibrillation. Pt reported his stools were tarry in appearance. Laboratory data in the ER revealed INR elevated at 4.01. He was subsequently admitted to the ICU.  At admission he remained hemodynamically stable. He was given vitamin K to correct his coagulopathy. He was given Kayexalate to treat his hyperkalemia. His abdominal ultrasound was consistent with cirrhosis and ascites. It was suspected he may have been hypervolemic with possible cardiorenal syndrome so he initially was given IV Lasix. Gastroenterology was consulted as well as Nephrology. Renal ultrasound revealed no evidence of obstruction. Patient has not required hemodialysis.  His potassium was corrected and other labs remained stable or improving and therefore he was deemed appropriate to transfer out of the ICU.  HPI/Subjective: The pt has developed wheezing this morning, w/ intermittent productive cough.  He denies cp, n/v, or abdom pain.    Assessment/Plan:  Cryptogenic Cirrhosis of liver with ascites - Amiodarone? -relatively new finding - liver "normal" via CT scan 2001 - [place] of liver March 2014 noted enlarged liver but no parenchymal findings noted - was being worked up by PCP  -patient endorses only minimal alcohol intake and hx appears reliable    -paracentesis fluid not convincing for SBP -ruled out wilson's disease w/ normal ceruloplasmin   -ANA negative - mitochondrial ab normal - anti-smooth muscle ab pending  -alpha-1-antitrypsin level normal  -ferritin normal therefore not hemochromatosis -viral hepatitis studies negative -I am suspicious that Amio may be the offending agent - I don't feel we can afford to continue its use - follow on tele w/ d/c of amio - recovery, if happens, will be slow if this is due to amio - if amio is felt to be absolutely required, pt will need a liver bx before resuming to prove amio is not the culprit  Acute renal failure / Hyperkalemia / Metabolic acidosis -Multifactorial: low output congestive heart failure, use of diuretics, & recent blood loss from GI bleed -BUN was markedly elevated at 149 with a creatinine of 4.66 at presentation  -crt continues to slowly improve   Mild upper GI hemorrhage: A) Acute esophagitis B) Gastritis -Appreciate GI assistance -No further bleeding since admission -EGD 1/22 revealed evidence of esophagitis but no varices - biopsy pending including H. pylori specimen -Continue PPI  Acute blood loss anemia -Hemoglobin has remained stable  -Iron saturation low >> Feraheme  Hypotension -Possibly a combination of low output heart failure + was aggressively diuresed at time of admission with greater than 6 L removed in setting of Imdur -Was not orthostatic when checked  Warfarin-induced coagulopathy + cirrhosis related coagulopathy  -Was given vitamin K at admission - INR now 1.47 - suspect has underlying coagulopathy due to liver disease - will not resume coumadin for now - follow INR    COPD  -wheezing today - xopenx nebs prn and on schedule - follow  exam - no acute distress at present   Acute Hypoxemic respiratory failure -Appeared to be mechanical in nature given abdominal distention for persistent ascites -Chest x-ray at time of admission only revealed mild  diffuse pulmonary vascular congestion without overt pulmonary edema -resolved   Chronic systolic heart failure -Echocardiogram this admission revealed EF of 35-40% - his EF has actually improved compared with echocardiogram from 2013 at which time EF was 25-30% -Of note right ventricle was normal despite patient's underlying COPD although he did have moderate tricuspid regurgitation - echo 2013 with moderate to severe pulmonary hypertension at 87 mmHg  CORONARY ARTERY DISEASE s/p CABG 1996 -Discontinued Imdur 1/22 given blood pressures in the 80s   Atrial fibrillation s/p ablation  -discontinue amiodarone as above  -Coumadin preadmission not to be resumed for now due to cirrhosis  -avoid albuterol   Hx of HTN  -BP still soft   Type II diabetes mellitus with peripheral circulatory disorders, uncontrolled -CBGs now elevated - adjust tx and follow trend   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 Wall thickness was normal. Systolic function was moderately reduced. The estimated ejection fraction was in the range of 35% to 40%. There is hypokinesis of the inferior myocardium. - Mitral valve: Calcified annulus. Mildly thickened leaflets . Moderate regurgitation. - Left atrium: The atrium was moderately dilated. - Right atrium: The atrium was mildly to moderately dilated. - Tricuspid valve: Moderate regurgitation.  EGD grade 1 esophagitis  No esophageal varices  Mild diffuse antral gastritis. Status post biopsies to rule out H.  pylori  Antibiotics: None  Objective: Blood pressure 95/55, pulse 83, temperature 98.1 F (36.7 C), temperature  source Oral, resp. rate 18, height 5\' 9"  (1.753 m), weight 67.949 kg (149 lb 12.8 oz), SpO2 95.00%.  Intake/Output Summary (Last 24 hours) at 08/28/13 1139 Last data filed at 08/28/13 1033  Gross per 24 hour  Intake   1400 ml  Output   1400 ml  Net      0 ml   Exam: General: mild diffuse exp wheeze - able to complete full sentences w/o pause  Lungs: no focal crackles  Cardiovascular: Irregular rhythm without tachycardia - no murmur gallop or rub - no peripheral edema Abdomen: Nontender, less distended, soft, nontender periumbilical hernia, bowel sounds positive, no rebound, no appreciable mass Musculoskeletal: No significant cyanosis, clubbing of bilateral lower extremities Neurological: Alert and oriented x 3, moves all extremities x 4 without focal neurological deficits, CN 2-12 intact  Scheduled Meds:  Scheduled Meds: . insulin aspart  0-15 Units Subcutaneous TID WC  . insulin detemir  15 Units Subcutaneous QHS  . pantoprazole  40 mg Oral BID  . spironolactone  25 mg Oral Daily    Data Reviewed: Basic Metabolic Panel:  Recent Labs Lab 08/24/13 0325 08/25/13 0715 08/26/13 0240 08/27/13 0330 08/28/13 0630  NA 140 144 142 139 135*  K 5.0 4.0 4.0 3.7 3.6*  CL 102 104 102 100 96  CO2 18* 23 20 23 24   GLUCOSE 184* 90 87 160* 260*  BUN 149* 96* 71* 60* 56*  CREATININE 4.66* 2.80* 2.02* 1.88* 1.89*  CALCIUM 9.1 9.1 9.5 8.5 8.3*  PHOS  --  4.5  --   --   --  Liver Function Tests:  Recent Labs Lab 08/23/13 0914 08/23/13 1534 08/25/13 0715 08/26/13 0240 08/27/13 0330 08/28/13 0630  AST 19 19  --  21 44* 37  ALT 26 25  --  23 27 30   ALKPHOS 89 96  --  75 100 111  BILITOT 0.9 0.6  --  2.4* 2.0* 1.4*  PROT 6.9 7.1  --  7.7 7.0 7.0  ALBUMIN 3.4* 3.4* 3.2* 3.4* 3.0* 2.9*    Recent Labs Lab 08/23/13 1534  LIPASE 71*    Recent Labs Lab 08/23/13 2207 08/28/13 0630  AMMONIA 45 10*   CBC:  Recent Labs Lab 08/23/13 0828 08/23/13 1534  08/24/13 0325  08/25/13 0215 08/26/13 0240 08/27/13 0330 08/28/13 0630  WBC 4.7 4.0  --  4.0 5.4 7.1 5.3 4.8  NEUTROABS 3.5 2.8  --   --   --   --   --   --   HGB 9.6* 9.5*  < > 9.2* 9.4* 10.8* 10.8* 10.3*  HCT 28.9* 28.4*  < > 27.9* 28.5* 32.7* 32.5* 31.3*  MCV 88.9 88.8  --  90.0 90.2 91.1 90.3 90.7  PLT 123.0* 146*  --  132* 139* 160 152 161  < > = values in this interval not displayed. BNP (last 3 results)  Recent Labs  11/29/12 0655 06/03/13 2108 08/23/13 1534  PROBNP 1278.0* 1390.0* 2953.0*   CBG:  Recent Labs Lab 08/27/13 0820 08/27/13 1326 08/27/13 1629 08/27/13 2102 08/28/13 0604  GLUCAP 174* 194* 275* 271* 256*    Recent Results (from the past 240 hour(s))  MRSA PCR SCREENING     Status: None   Collection Time    08/23/13 10:45 PM      Result Value Range Status   MRSA by PCR NEGATIVE  NEGATIVE Final   Comment:            The GeneXpert MRSA Assay (FDA     approved for NASAL specimens     only), is one component of a     comprehensive MRSA colonization     surveillance program. It is not     intended to diagnose MRSA     infection nor to guide or     monitor treatment for     MRSA infections.     Studies:  Recent x-ray studies have been reviewed in detail by the Attending Physician  Time spent : 35mins   Cherene Altes, MD [facility] Office  [phone] Pager [phone]  On-Call/Text Page:      [email or web address]      password [facility]  08/28/2013, 11:39 AM   LOS: 5 days

## 2013-08-29 ENCOUNTER — Encounter (HOSPITAL_COMMUNITY): Payer: Self-pay | Admitting: General Practice

## 2013-08-29 DIAGNOSIS — R188 Other ascites: Secondary | ICD-10-CM

## 2013-08-29 DIAGNOSIS — Z9229 Personal history of other drug therapy: Secondary | ICD-10-CM

## 2013-08-29 DIAGNOSIS — I4891 Unspecified atrial fibrillation: Secondary | ICD-10-CM

## 2013-08-29 DIAGNOSIS — E875 Hyperkalemia: Secondary | ICD-10-CM | POA: Diagnosis not present

## 2013-08-29 DIAGNOSIS — N179 Acute kidney failure, unspecified: Secondary | ICD-10-CM | POA: Diagnosis present

## 2013-08-29 DIAGNOSIS — K7460 Unspecified cirrhosis of liver: Secondary | ICD-10-CM

## 2013-08-29 DIAGNOSIS — K209 Esophagitis, unspecified without bleeding: Secondary | ICD-10-CM | POA: Diagnosis not present

## 2013-08-29 DIAGNOSIS — G4730 Sleep apnea, unspecified: Secondary | ICD-10-CM | POA: Diagnosis present

## 2013-08-29 DIAGNOSIS — I255 Ischemic cardiomyopathy: Secondary | ICD-10-CM | POA: Diagnosis present

## 2013-08-29 DIAGNOSIS — N189 Chronic kidney disease, unspecified: Secondary | ICD-10-CM

## 2013-08-29 DIAGNOSIS — I5023 Acute on chronic systolic (congestive) heart failure: Secondary | ICD-10-CM

## 2013-08-29 LAB — CBC
HCT: 28.4 % — ABNORMAL LOW (ref 39.0–52.0)
Hemoglobin: 9.1 g/dL — ABNORMAL LOW (ref 13.0–17.0)
MCH: 29.4 pg (ref 26.0–34.0)
MCHC: 32 g/dL (ref 30.0–36.0)
MCV: 91.6 fL (ref 78.0–100.0)
PLATELETS: 148 10*3/uL — AB (ref 150–400)
RBC: 3.1 MIL/uL — AB (ref 4.22–5.81)
RDW: 17.7 % — AB (ref 11.5–15.5)
WBC: 3.9 10*3/uL — AB (ref 4.0–10.5)

## 2013-08-29 LAB — ANTI-SMOOTH MUSCLE ANTIBODY, IGG: F-ACTIN AB IGG: 9 U (ref <20)

## 2013-08-29 LAB — GLUCOSE, CAPILLARY
GLUCOSE-CAPILLARY: 226 mg/dL — AB (ref 70–99)
GLUCOSE-CAPILLARY: 230 mg/dL — AB (ref 70–99)
Glucose-Capillary: 173 mg/dL — ABNORMAL HIGH (ref 70–99)
Glucose-Capillary: 129 mg/dL — ABNORMAL HIGH (ref 70–99)

## 2013-08-29 LAB — PROTIME-INR
INR: 1.45 (ref 0.00–1.49)
PROTHROMBIN TIME: 17.3 s — AB (ref 11.6–15.2)

## 2013-08-29 LAB — COMPREHENSIVE METABOLIC PANEL
ALT: 33 U/L (ref 0–53)
AST: 36 U/L (ref 0–37)
Albumin: 2.8 g/dL — ABNORMAL LOW (ref 3.5–5.2)
Alkaline Phosphatase: 102 U/L (ref 39–117)
BILIRUBIN TOTAL: 1.1 mg/dL (ref 0.3–1.2)
BUN: 56 mg/dL — AB (ref 6–23)
CHLORIDE: 96 meq/L (ref 96–112)
CO2: 22 meq/L (ref 19–32)
Calcium: 7.8 mg/dL — ABNORMAL LOW (ref 8.4–10.5)
Creatinine, Ser: 1.86 mg/dL — ABNORMAL HIGH (ref 0.50–1.35)
GFR calc Af Amer: 40 mL/min — ABNORMAL LOW (ref >90)
GFR, EST NON AFRICAN AMERICAN: 35 mL/min — AB (ref >90)
Glucose, Bld: 313 mg/dL — ABNORMAL HIGH (ref 70–99)
Potassium: 3.7 mEq/L (ref 3.7–5.3)
Sodium: 135 mEq/L — ABNORMAL LOW (ref 137–147)
Total Protein: 6.4 g/dL (ref 6.0–8.3)

## 2013-08-29 MED ORDER — FUROSEMIDE 20 MG PO TABS
20.0000 mg | ORAL_TABLET | Freq: Every day | ORAL | Status: DC
  Administered 2013-08-29 – 2013-09-02 (×5): 20 mg via ORAL
  Filled 2013-08-29 (×5): qty 1

## 2013-08-29 MED ORDER — METHYLPREDNISOLONE SODIUM SUCC 125 MG IJ SOLR
60.0000 mg | Freq: Two times a day (BID) | INTRAMUSCULAR | Status: AC
  Administered 2013-08-29: 62.5 mg via INTRAVENOUS
  Administered 2013-08-29: 60 mg via INTRAVENOUS
  Filled 2013-08-29 (×2): qty 0.96

## 2013-08-29 MED ORDER — IPRATROPIUM BROMIDE 0.02 % IN SOLN
0.5000 mg | Freq: Four times a day (QID) | RESPIRATORY_TRACT | Status: DC
  Administered 2013-08-29 – 2013-08-31 (×8): 0.5 mg via RESPIRATORY_TRACT
  Filled 2013-08-29 (×10): qty 2.5

## 2013-08-29 MED ORDER — INSULIN ASPART 100 UNIT/ML ~~LOC~~ SOLN
4.0000 [IU] | Freq: Three times a day (TID) | SUBCUTANEOUS | Status: DC
  Administered 2013-08-30 – 2013-09-02 (×9): 4 [IU] via SUBCUTANEOUS

## 2013-08-29 MED ORDER — CARVEDILOL 3.125 MG PO TABS
3.1250 mg | ORAL_TABLET | Freq: Two times a day (BID) | ORAL | Status: DC
  Administered 2013-08-30: 3.125 mg via ORAL
  Filled 2013-08-29 (×5): qty 1

## 2013-08-29 NOTE — Progress Notes (Signed)
[name] 1 - Stepdown / ICU Progress Note  [name] [id number] DOB: [birth year] DOA: 08/23/2013 PCP: [name], MD  Brief narrative: 73 year old male patient brought to the ED by EMS. 24 hours prior to presentation the patient had blood work done at his PCP's office which demonstrated marked hyperkalemia and other lab abnormalities. The primary care physician's office called EMS to have the patient transported to the hospital. After arrival the patient endorsed that he had been experiencing abdominal discomfort for 2 weeks with some nausea and vomiting and diarrhea. It was reported that he had heme positive stool. Patient was on chronic Coumadin for atrial fibrillation. Pt reported his stools were tarry in appearance. Laboratory data in the ER revealed INR elevated at 4.01. He was subsequently admitted to the ICU.  At admission he remained hemodynamically stable. He was given vitamin K to correct his coagulopathy. He was given Kayexalate to treat his hyperkalemia. His abdominal ultrasound was consistent with cirrhosis and ascites. It was suspected he may have been hypervolemic with possible cardiorenal syndrome so he initially was given IV Lasix. Gastroenterology was consulted as well as Nephrology. Renal ultrasound revealed no evidence of obstruction. Patient has not required hemodialysis.  His potassium was corrected and other labs remained stable or improving and therefore he was deemed appropriate to transfer out of the ICU.  HPI/Subjective: The pt continues to wheeze, w/ intermittent productive cough.  He denies cp, n/v, or abdom pain.    Assessment/Plan:  Cryptogenic Cirrhosis of liver with ascites - Amiodarone? -relatively new finding - liver "normal" via CT scan 2001 - [place] of liver March 2014 noted enlarged liver but no parenchymal findings noted - was being worked up by PCP  -patient endorses only minimal alcohol intake and hx appears reliable  -paracentesis fluid  not convincing for SBP -ruled out wilson's disease w/ normal ceruloplasmin   -ANA negative - mitochondrial ab normal - anti-smooth muscle ab pending  -alpha-1-antitrypsin level normal  -ferritin normal therefore not hemochromatosis -viral hepatitis studies negative -I am suspicious that Amio may be the offending agent - I don't feel we can afford to continue its use - follow on tele w/ d/c of amio - recovery, if happens, will be slow if this is due to amio   Acute renal failure / Hyperkalemia / Metabolic acidosis -Multifactorial: low output congestive heart failure, use of diuretics, & recent blood loss from GI bleed -BUN was markedly elevated at 149 with a creatinine of 4.66 at presentation  -crt continues to slowly improve   Mild upper GI hemorrhage: A) Acute esophagitis B) Gastritis -Appreciate GI assistance -No further bleeding since admission -EGD 1/22 revealed evidence of esophagitis but no varices - biopsy pending including H. pylori specimen -Continue PPI  Acute blood loss anemia -Hemoglobin has remained stable  -Iron saturation low >> Feraheme  Hypotension -Possibly a combination of low output heart failure + was aggressively diuresed at time of admission with greater than 6 L removed in setting of Imdur -Was not orthostatic when checked  Warfarin-induced coagulopathy + cirrhosis related coagulopathy  -Was given vitamin K at admission - INR now 1.47 - suspect had underlying coagulopathy due to liver disease - will not resume coumadin for now - follow INR -considering use of novel anticoagulation agent instead - ask Cardiology to weigh in    COPD  -wheezing still today - xopenx nebs prn and on schedule - add Atrovent - follow exam - no acute distress at present - trial  of short steroid course   Acute Hypoxemic respiratory failure -Appeared to be mechanical in nature given abdominal distention for persistent ascites -Chest x-ray at time of admission only revealed mild  diffuse pulmonary vascular congestion without overt pulmonary edema -Although not requiring oxygen at rest continuing to wheeze is noted above suspect concerning for possible cardiac wheezing (see below) -Check ambulatory pulse ox on room air since patient and endorsing dyspnea on exertion  Chronic systolic heart failure -Echocardiogram this admission revealed EF of 35-40% - his EF has actually improved compared with echocardiogram from 2013 at which time EF was 25-30% -Of note right ventricle was normal despite patient's underlying COPD although he did have moderate tricuspid regurgitation - echo 2013 with moderate to severe pulmonary hypertension at 21 mmHg -Cardiology consultation  CORONARY ARTERY DISEASE s/p CABG 1996 -Discontinued Imdur 1/22 given blood pressures in the 80s   Atrial fibrillation s/p ablation  -discontinue amiodarone as above  -Coumadin preadmission not to be resumed for now due to cirrhosis  -avoid albuterol   Hx of HTN  -BP still soft   Type II diabetes mellitus with peripheral circulatory disorders, uncontrolled -CBGs elevated - adjust tx and follow trend   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 Wall thickness was normal. Systolic function was moderately reduced. The estimated ejection fraction was in the range of 35% to 40%. There is hypokinesis of the inferior myocardium. - Mitral valve: Calcified annulus. Mildly thickened leaflets . Moderate regurgitation. - Left atrium: The atrium was moderately dilated. - Right atrium: The atrium was mildly to moderately dilated. - Tricuspid valve: Moderate  regurgitation.  EGD grade 1 esophagitis  No esophageal varices  Mild diffuse antral gastritis. Status post biopsies to rule out H.  pylori  Antibiotics: None  Objective: Blood pressure 95/43, pulse 84, temperature 97.9 F (36.6 C), temperature source Oral, resp. rate 18, height 5\' 9"  (1.753 m), weight 147 lb 7.8 oz (66.9 kg), SpO2 98.00%.  Intake/Output Summary (Last 24 hours) at 08/29/13 1319 Last data filed at 08/29/13 1100  Gross per 24 hour  Intake    820 ml  Output   1326 ml  Net   -506 ml   Exam: General: mild diffuse exp wheeze - able to complete full sentences w/o pause  Lungs: mild bibasilar crackles  Cardiovascular: Irregular rhythm without tachycardia - no murmur gallop or rub - no peripheral edema Abdomen: Nontender, less distended, soft, nontender periumbilical hernia, bowel sounds positive, no rebound, no appreciable mass Musculoskeletal: No significant cyanosis, clubbing of bilateral lower extremities Neurological: Alert and oriented x 3, moves all extremities x 4 without focal neurological deficits, CN 2-12 intact  Scheduled Meds:  Scheduled Meds: . insulin aspart  0-15 Units Subcutaneous TID WC  . insulin detemir  15 Units Subcutaneous QHS  . ipratropium  0.5 mg Nebulization QID  . levalbuterol  0.63 mg Nebulization QID  . methylPREDNISolone (SOLU-MEDROL) injection  60 mg Intravenous Q12H  . pantoprazole  40 mg Oral BID  . spironolactone  25 mg Oral Daily    Data Reviewed: Basic Metabolic Panel:  Recent Labs Lab 08/25/13 0715 08/26/13 0240 08/27/13 0330 08/28/13 0630 08/29/13 0403  NA 144 142 139 135* 135*  K 4.0  4.0 3.7 3.6* 3.7  CL 104 102 100 96 96  CO2 23 20 23 24 22   GLUCOSE 90 87 160* 260* 313*  BUN 96* 71* 60* 56* 56*  CREATININE 2.80* 2.02* 1.88* 1.89* 1.86*  CALCIUM 9.1 9.5 8.5 8.3* 7.8*  PHOS 4.5  --   --   --   --    Liver Function Tests:  Recent Labs Lab 08/23/13 1534 08/25/13 0715 08/26/13 0240 08/27/13 0330  08/28/13 0630 08/29/13 0403  AST 19  --  21 44* 37 36  ALT 25  --  23 27 30  33  ALKPHOS 96  --  75 100 111 102  BILITOT 0.6  --  2.4* 2.0* 1.4* 1.1  PROT 7.1  --  7.7 7.0 7.0 6.4  ALBUMIN 3.4* 3.2* 3.4* 3.0* 2.9* 2.8*    Recent Labs Lab 08/23/13 1534  LIPASE 71*    Recent Labs Lab 08/23/13 2207 08/28/13 0630  AMMONIA 45 10*   CBC:  Recent Labs Lab 08/23/13 0828 08/23/13 1534  08/25/13 0215 08/26/13 0240 08/27/13 0330 08/28/13 0630 08/29/13 0403  WBC 4.7 4.0  < > 5.4 7.1 5.3 4.8 3.9*  NEUTROABS 3.5 2.8  --   --   --   --   --   --   HGB 9.6* 9.5*  < > 9.4* 10.8* 10.8* 10.3* 9.1*  HCT 28.9* 28.4*  < > 28.5* 32.7* 32.5* 31.3* 28.4*  MCV 88.9 88.8  < > 90.2 91.1 90.3 90.7 91.6  PLT 123.0* 146*  < > 139* 160 152 161 148*  < > = values in this interval not displayed. BNP (last 3 results)  Recent Labs  11/29/12 0655 06/03/13 2108 08/23/13 1534  PROBNP 1278.0* 1390.0* 2953.0*   CBG:  Recent Labs Lab 08/28/13 1129 08/28/13 1645 08/28/13 2110 08/29/13 0648 08/29/13 1108  GLUCAP 130* 199* 215* 226* 173*    Recent Results (from the past 240 hour(s))  MRSA PCR SCREENING     Status: None   Collection Time    08/23/13 10:45 PM      Result Value Range Status   MRSA by PCR NEGATIVE  NEGATIVE Final   Comment:            The GeneXpert MRSA Assay (FDA     approved for NASAL specimens     only), is one component of a     comprehensive MRSA colonization     surveillance program. It is not     intended to diagnose MRSA     infection nor to guide or     monitor treatment for     MRSA infections.     Studies:  Recent x-ray studies have been reviewed in detail by the Attending Physician  Time spent : 67mins  Allison Ellis, ANP [facility] Office  [phone] Pager [phone]  On-Call/Text Page:      [email or web address]      password [facility]  08/29/2013, 1:19 PM   LOS: 6 days   I have personally examined this patient and reviewed the entire  database. I have reviewed the above note, made any necessary editorial changes, and agree with its content.  Cherene Altes, MD [facility]

## 2013-08-29 NOTE — Progress Notes (Signed)
Daily Rounding Note  08/29/2013, 8:27 AM  LOS: 6 days   SUBJECTIVE:       Urine output 1.1 liters yesterday, 1.6 liters 2 days ago. Weight down 1 kg in last 24 hours.  No complaints, eating well.  Says he felt significantly better after paracentesis last week.   OBJECTIVE:         Vital signs in last 24 hours:    Temp:  [97.9 F (36.6 C)-99 F (37.2 C)] 97.9 F (36.6 C) (01/26 0630) Pulse Rate:  [78-93] 93 (01/26 0630) Resp:  [18-20] 20 (01/26 0630) BP: (93-128)/(52-76) 96/60 mmHg (01/26 0630) SpO2:  [95 %-97 %] 96 % (01/26 0744) Weight:  [66.9 kg (147 lb 7.8 oz)] 66.9 kg (147 lb 7.8 oz) (01/26 0630) Last BM Date: 08/26/13 General: looks unwell, pleasant, comfortable   Heart: RRR Chest: clear bil.  No labored breathing Abdomen: very soft, NT and protuberant with obvious ascites.  + ventral hernia  Extremities: no CCE Neuro/Psych:  Pleasant, no confusion, no somnolence.   Intake/Output from previous day: 01/25 0701 - 01/26 0700 In: 720 [P.O.:720] Out: 1126 [Urine:1125; Stool:1]  Intake/Output this shift: Total I/O In: -  Out: 100 [Urine:100]  Lab Results:  Recent Labs  08/27/13 0330 08/28/13 0630 08/29/13 0403  WBC 5.3 4.8 3.9*  HGB 10.8* 10.3* 9.1*  HCT 32.5* 31.3* 28.4*  PLT 152 161 148*   BMET  Recent Labs  08/27/13 0330 08/28/13 0630 08/29/13 0403  NA 139 135* 135*  K 3.7 3.6* 3.7  CL 100 96 96  CO2 23 24 22   GLUCOSE 160* 260* 313*  BUN 60* 56* 56*  CREATININE 1.88* 1.89* 1.86*  CALCIUM 8.5 8.3* 7.8*   LFT  Recent Labs  08/27/13 0330 08/28/13 0630 08/29/13 0403  PROT 7.0 7.0 6.4  ALBUMIN 3.0* 2.9* 2.8*  AST 44* 37 36  ALT 27 30 33  ALKPHOS 100 111 102  BILITOT 2.0* 1.4* 1.1   PT/INR  Recent Labs  08/28/13 0630 08/29/13 0403  LABPROT 17.4* 17.3*  INR 1.47 1.45   Hepatitis Panel No results found for this basename: HEPBSAG, HCVAB, HEPAIGM, HEPBIGM,  in the last 72  hours  Studies/Results: No results found.  ASSESMENT:   *  [place], FOB+ stools.  Esophagogastritis, no varices on 1/22 EGD.  H Pylori negative.  On once daily Protonix.   *  New diagnoses of cirrhosis.  MELD 1.45 (though INR may still be somewhat altered by previous coumadin) Acute hepatitis panel negative.  ANA negative.  Mitochondrial Ab negative range.  Alpha 1 Antitrypsin in normal range. Ceruloplasmin in normal range. Thus no clear etiology for cirrhosis defined, ? Due to Amiodarone which has been discontinued permanently.  T bili improved.   *  Ascites.  Paracentesis 1/25:  No SBP.  SAG:  1.1.  Ascites cytology are pending, spoke with pathology today.   *  Chronic kidney disease.   *  Hyponatremia.   *  Thrombocytopenia.   *  Normocytic anemia, low iron saturation.  Feraheme infusion on 1/22.  No transfusions to date.   *  Chronic Coumadin for hx A fib, previous ablation. INR reversed with Vitamin K. Plan is not to resume given the dx of cirrhosis.   *  Bladder wall tumor, cystoscopy/resection delayed by health issues since 06/2013.   *  CHF.   *  Protein malnutrition.   *  Type 2 DM, treated with insulin PTA  PLAN   *  Continue low dose Sprironolactone.  Ideally would add Furosemide but BPs are too low for this at present.  Worried that renal function will progress as well if pt given additional diuretics.     Azucena Freed  08/29/2013, 8:27 AM Pager: [phone]  GI ATTENDING  History interval laboratories reviewed. Patient seen and examined. Results from paracentesis as outlined above. Agree with continuing to treat primary problems. For liver disease and ascites continue with low-dose Aldactone. 2 g sodium diet. Outpatient GI followup with Dr. Olevia Perches will be arranged. Will sign off. Thank you  Docia Chuck. Geri Seminole., M.D. [facility] Division of Gastroenterology

## 2013-08-29 NOTE — Progress Notes (Signed)
Nutrition Education Note RD consulted for diet education.  Pt received nutrition education regarding diabetes management during previous admission 06/07/13. Pt requesting additional sample menus.   RD provided "1500 Calorie 5 Day-Menus" handout from the Academy of [employer]. Pt follows a vegetarian diet. Reviewed menus, encouraged pt to eat all food groups daily, and discussed vegetarian protein-rich foods.  Due to recent ascites and history of heart failure encouraged pt to restrict his sodium intake to less than 2000 milligrams daily. Provided "Low Sodium Nutrition Therapy" handout. Discouraged intake of processed foods and use of salt shaker. Encouraged fresh fruits and vegetables as well as whole grain sources of carbohydrates to maximize fiber intake. Pt states he already chooses low sodium V8 juice and eats unsalted crackers. Encouraged pt to limit V8 juice to one serving daily. He does consume canned vegetables; encouraged pt to choose low sodium varieties and to rinse canned vegetables prior to cooking.   Expect good compliance.  Body mass index is 21.77 kg/(m^2). Pt meets criteria for Normal Weight based on current BMI.  Current diet order is Carb Modified, 2 gm sodium, patient is consuming approximately 75-100% of meals at this time. Labs and medications reviewed. No further nutrition interventions warranted at this time. RD contact information provided. If additional nutrition issues arise, please re-consult RD.  Pryor Ochoa RD, LDN Inpatient Clinical Dietitian Pager: [phone] After Hours Pager: [phone]

## 2013-08-29 NOTE — Consult Note (Signed)
I have reviewed the records, reviewed the history taken directly from the patient and personally examined the patient. I agree with the history and physical as documented by Mr. Rosalyn Gess.  This is a very complicated situation with newly diagnosed Cryptogenic Cirrhosis and a long standing history or chronic systolic heart failure. He is recovering from severe acute kidney failure and presented with GI bleeding from esophagogastritis. His blood pressures are soft and heart failure therapy will be difficult to resume until they improve. Heart failure is present and he is short of breath.  I would recommend resuming: 1. Low dose Beta blocker therapy when SBP consistently > 100 mmHg (carvedilol 3.125 mg BID) 2. Low dose Loop diuretic in combination with low dose aldactone (as tolerated by BP and renal function). 3. I would stay away from Aldactone and ACE therapy combo. 4. Agree with discontinuing amiodarone. 5. Not a candidate for advance heart failure therapies.  Overall condition is very poor and long term prognosis is grim. It may not be to early to begin addressing code status and end of life.

## 2013-08-29 NOTE — Evaluation (Signed)
Occupational Therapy Evaluation Patient Details Name: [name] MRN: [id number] DOB: 05/17/1941 Today's Date: 08/29/2013 Time: 9326-7124 OT Time Calculation (min): 14 min  OT Assessment / Plan / Recommendation History of present illness Pt admit with acute renal failure.   Clinical Impression   Pt is at set up/sup level with ADLs and min guard A with ADL mobility due to decreased balance and endurance. No further acuteOT services indicated and pt to continue with acute PT services to address functional mobility safety     OT Assessment  Patient does not need any further OT services    Follow Up Recommendations  Supervision/Assistance - 24 hour    Barriers to Discharge  pt lives at home alone    Equipment Recommendations  Tub/shower seat    Recommendations for Other Services    Frequency       Precautions / Restrictions Precautions Precautions: Fall Restrictions Weight Bearing Restrictions: No   Pertinent Vitals/Pain No c/o pain    ADL  Grooming: Performed;Wash/dry hands;Wash/dry face;Supervision/safety Upper Body Bathing: Simulated;Set up Where Assessed - Upper Body Bathing: Unsupported sitting Lower Body Bathing: Simulated;Supervision/safety Where Assessed - Lower Body Bathing: Unsupported sitting;Supported sit to stand Upper Body Dressing: Performed;Supervision/safety Where Assessed - Upper Body Dressing: Unsupported sitting Lower Body Dressing: Performed;Supervision/safety Where Assessed - Lower Body Dressing: Unsupported sitting;Supported sit to stand Toilet Transfer: Performed;Min guard Toilet Transfer Method: Sit to Loss adjuster, chartered: Regular height toilet;Grab bars Toileting - Clothing Manipulation and Hygiene: Performed;Supervision/safety Where Assessed - Best boy and Hygiene: Standing Tub/Shower Transfer Method: Not assessed Equipment Used: Gait belt Transfers/Ambulation Related to ADLs: assit to steady balance  upon initial sit - stand  with LB ADLs/standing due to decreased balance and easily fatigues ADL Comments: pt currently stepping an and out of shower, does not have tub seat/bench    OT Diagnosis:    OT Problem List:   OT Treatment Interventions:     OT Goals(Current goals can be found in the care plan section) Acute Rehab OT Goals Patient Stated Goal: to go home  Visit Information  Last OT Received On: 08/29/13 History of Present Illness: Pt admit with acute renal failure.       Prior [place] expects to be discharged to:: Private residence Living Arrangements: Alone Available Help at Discharge: Other (Comment) (pt helps out his sister with alzheimrs daily) Type of Home: House Home Access: Stairs to enter Home Layout: One level Home Equipment: Kasandra Knudsen - single point Prior Function Level of Independence: Independent Comments: still driving, using a scooter around grocery store Communication Communication: No difficulties Dominant Hand: Right         Vision/Perception Vision - History Baseline Vision: No visual deficits Patient Visual Report: No change from baseline Perception Perception: Within Functional Limits   Cognition  Cognition Arousal/Alertness: Awake/alert Behavior During Therapy: WFL for tasks assessed/performed Overall Cognitive Status: Within Functional Limits for tasks assessed    Extremity/Trunk Assessment Upper Extremity Assessment Upper Extremity Assessment: Overall WFL for tasks assessed Lower Extremity Assessment Lower Extremity Assessment: Defer to PT evaluation     Mobility Bed Mobility Overal bed mobility: Independent Transfers Overall transfer level: Needs assistance Equipment used: 1 person hand held assist Transfers: Sit to/from Stand Sit to Stand: Min guard General transfer comment: Needs steadying assist as pt with poor balance reactions if perturbed or distracted.           Balance  Balance Overall balance assessment: Needs assistance Sitting-balance support:  No upper extremity supported;Feet supported Sitting balance-Leahy Scale: Good Standing balance support: No upper extremity supported;During functional activity Standing balance-Leahy Scale: Fair High Level Balance Comments: needs steadying assist.   End of Session OT - End of Session Equipment Utilized During Treatment: Gait belt Activity Tolerance: Patient tolerated treatment well Patient left: in bed;Other (comment) (sitting EOB eating lunch)  GO     Britt Bottom 08/29/2013, 1:16 PM

## 2013-08-29 NOTE — Consult Note (Signed)
Reason for Consult: CHF  Requesting Physician: Mary Sella  HPI: 73 y/o from [place], followed by Dr Silvio Pate and Dr Rockey Situ.  He has a history of CAD, s/p CABG in '96 with Myoview 2011. He has had a history of PAFand has had a remote RFA. He has been holding NSR as an OP on Amiodarone. He has also been on chronic Coumadin. He has a cardiomyopathy with an EF as low as 25-30% Sept 2013, though echo this admission actually showed his EF to be improved to 35-40%. He has chronic CHF and his diuretics are adjusted by Dr Silvio Pate and Dr Rockey Situ as an OP. It appears his gaol wgt is 160.             He was admitted in Oct 2014 with hematuria and work up revealed bladder cancer. He was to have a cystoscopy but he apparently developed cellulitis as an OP and required antibiotics. He had reported anorexia to his primary MD and labs were drawn 08/23/13 showing acute on chronic renal failure with a SCr of 5.22, baseline 1.4, and a K+ of 6.3. He was sent to the ER by EMS. He was also complaining of BRB per rectum in the setting of an INR of 4.0. His Hgb dropped to 9.5. He has been followed by the renal and GI service. His SCr has improved. Endoscopy revealed gastritis and esophagitis, no varices. Abdominal US done revealed cirrhosis with ascites, 6 L were drawn off on 08/26/13. He is now off Coumadin.             He now complains of DOE. His wgt is down to 147. His SCr has improved to 1.8. His wgt is down to 147.            PMHx:  Past Medical History  Diagnosis Date  . Atrial fibrillation      S/P ablation  . CAD (coronary artery disease)     S/P MI 1980's----------------------Dr McDowell  . Hyperlipidemia   . CHF (congestive heart failure)   . 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 BPH (benign prostatic hypertrophy)   . Stroke   . Sleep disturbance   . Bladder tumor 06/2013    seen at [facility] Dr Ellin Mayhew.  no cysto or biopsy as of 06/2013.  work up delayed by  cellulitis.    Past Surgical History  Procedure Laterality Date  . Coronary artery bypass graft    . Appendectomy    . Tonsillectomy    . Upper gastrointestinal endoscopy      Gastritis , ? H. pylori 08/28/1999  . Atrial ablation surgery    . Esophagogastroduodenoscopy N/A 08/25/2013    Procedure: ESOPHAGOGASTRODUODENOSCOPY (EGD);  Surgeon: Lafayette Dragon, MD;  Location: [facility] ENDOSCOPY;  Service: Endoscopy;  Laterality: N/A;    FAMHx: Parkinsons   SOCHx:  reports that he quit smoking about 40 years ago. His smoking use included Cigarettes. He smoked 1.00 pack per day. He has never used smokeless tobacco. He reports that he does not drink alcohol or use illicit drugs.  ALLERGIES: Allergies  Allergen Reactions  . Latex Other (See Comments)    Very thin skin, pulls off as removing bandaids     ROS: Pertinent items are noted in HPI. See H&P for complete details  HOME MEDICATIONS: Prescriptions prior to admission  Medication Sig Dispense Refill  . acetaminophen (TYLENOL) 500 MG tablet Take 500 mg by mouth every 6 (six) hours as  needed for moderate pain.      Marland Kitchen albuterol (PROVENTIL) (5 MG/ML) 0.5% nebulizer solution Take 0.5 mLs (2.5 mg total) by nebulization every 6 (six) hours as needed for wheezing or shortness of breath.  150 mL  1  . amiodarone (PACERONE) 200 MG tablet Take 1 tablet (200 mg total) by mouth every morning.  30 tablet  3  . atorvastatin (LIPITOR) 20 MG tablet Take 20 mg by mouth at bedtime.       . carvedilol (COREG) 6.25 MG tablet Take 1 tablet (6.25 mg total) by mouth 2 (two) times daily with a meal.  60 tablet  5  . Cholecalciferol (VITAMIN D) 1000 UNITS capsule Take 1,000 Units by mouth every morning.      . docusate sodium (COLACE) 100 MG capsule Take 100 mg by mouth 2 (two) times daily.      . insulin detemir (LEVEMIR) 100 UNIT/ML injection Inject 15 Units into the skin at bedtime.      . isosorbide mononitrate (IMDUR) 30 MG 24 hr tablet Take 1 tablet (30 mg  total) by mouth at bedtime.  30 tablet  6  . LORazepam (ATIVAN) 0.5 MG tablet Take 0.5-1 mg by mouth at bedtime as needed (for sleep).       . metolazone (ZAROXOLYN) 2.5 MG tablet Take 2.5 mg by mouth 2 (two) times a week. Do not take if your weight is under 160# at home. Usually takes tuesdays and fridays      . Multiple Vitamin (MULTIVITAMIN WITH MINERALS) TABS tablet Take 1 tablet by mouth daily.      . Multiple Vitamin (MULTIVITAMIN WITH MINERALS) TABS Take 1 tablet by mouth daily.      Marland Kitchen oxyCODONE (OXY IR/ROXICODONE) 5 MG immediate release tablet Take 1 tablet (5 mg total) by mouth every 6 (six) hours as needed for moderate pain.  20 tablet  0  . promethazine (PHENERGAN) 25 MG tablet Take 1 tablet (25 mg total) by mouth 3 (three) times daily as needed for nausea or vomiting.  6 tablet  0  . spironolactone (ALDACTONE) 25 MG tablet Take 100 mg by mouth daily.       Marland Kitchen torsemide (DEMADEX) 20 MG tablet Take 2 tablets (40 mg total) by mouth 2 (two) times daily. Hold afternoon dose if your weight at home is under 160#  120 tablet  11  . warfarin (COUMADIN) 5 MG tablet Take 5 mg by mouth at bedtime.        HOSPITAL MEDICATIONS: I have reviewed the patient's current medications.  VITALS: Blood pressure 95/43, pulse 84, temperature 97.9 F (36.6 C), temperature source Oral, resp. rate 18, height _0  (1.753 m), weight 147 lb 7.8 oz (66.9 kg), SpO2 98.00%.  PHYSICAL EXAM: General appearance: alert, cooperative, no distress and moderately obese Neck: JVP noted Lungs: wheezing, rhonchi Heart: regular rate and rhythm Abdomen: protuberant Sacral edema noted Extremities: chronic venous changes, 1-2+ pitting edema on Lt, 1+ on Rt Pulses: diminnished Skin: cool and dry Neurologic: Grossly normal  LABS: Results for orders placed during the hospital encounter of 08/23/13 (from the past 48 hour(s))  GLUCOSE, CAPILLARY     Status: Abnormal   Collection Time    08/27/13  4:29 PM      Result Value  Range   Glucose-Capillary 275 (*) 70 - 99 mg/dL   Comment 1 Notify RN    GLUCOSE, CAPILLARY     Status: Abnormal   Collection Time    08/27/13  9:02 PM      Result Value Range   Glucose-Capillary 271 (*) 70 - 99 mg/dL  GLUCOSE, CAPILLARY     Status: Abnormal   Collection Time    08/28/13  6:04 AM      Result Value Range   Glucose-Capillary 256 (*) 70 - 99 mg/dL  COMPREHENSIVE METABOLIC PANEL     Status: Abnormal   Collection Time    08/28/13  6:30 AM      Result Value Range   Sodium 135 (*) 137 - 147 mEq/L   Potassium 3.6 (*) 3.7 - 5.3 mEq/L   Chloride 96  96 - 112 mEq/L   CO2 24  19 - 32 mEq/L   Glucose, Bld 260 (*) 70 - 99 mg/dL   BUN 56 (*) 6 - 23 mg/dL   Creatinine, Ser 1.89 (*) 0.50 - 1.35 mg/dL   Calcium 8.3 (*) 8.4 - 10.5 mg/dL   Total Protein 7.0  6.0 - 8.3 g/dL   Albumin 2.9 (*) 3.5 - 5.2 g/dL   AST 37  0 - 37 U/L   ALT 30  0 - 53 U/L   Alkaline Phosphatase 111  39 - 117 U/L   Total Bilirubin 1.4 (*) 0.3 - 1.2 mg/dL   GFR calc non Af Amer 34 (*) >90 mL/min   GFR calc Af Amer 40 (*) >90 mL/min   Comment: (NOTE)     The eGFR has been calculated using the CKD EPI equation.     This calculation has not been validated in all clinical situations.     eGFR's persistently <90 mL/min signify possible Chronic Kidney     Disease.  PROTIME-INR     Status: Abnormal   Collection Time    08/28/13  6:30 AM      Result Value Range   Prothrombin Time 17.4 (*) 11.6 - 15.2 seconds   INR 1.47  0.00 - 1.49  APTT     Status: None   Collection Time    08/28/13  6:30 AM      Result Value Range   aPTT 32  24 - 37 seconds  AMMONIA     Status: Abnormal   Collection Time    08/28/13  6:30 AM      Result Value Range   Ammonia 10 (*) 11 - 60 umol/L  CBC     Status: Abnormal   Collection Time    08/28/13  6:30 AM      Result Value Range   WBC 4.8  4.0 - 10.5 K/uL   RBC 3.45 (*) 4.22 - 5.81 MIL/uL   Hemoglobin 10.3 (*) 13.0 - 17.0 g/dL   HCT 31.3 (*) 39.0 - 52.0 %   MCV 90.7   78.0 - 100.0 fL   MCH 29.9  26.0 - 34.0 pg   MCHC 32.9  30.0 - 36.0 g/dL   RDW 17.6 (*) 11.5 - 15.5 %   Platelets 161  150 - 400 K/uL  GLUCOSE, CAPILLARY     Status: Abnormal   Collection Time    08/28/13 11:29 AM      Result Value Range   Glucose-Capillary 130 (*) 70 - 99 mg/dL  GLUCOSE, CAPILLARY     Status: Abnormal   Collection Time    08/28/13  4:45 PM      Result Value Range   Glucose-Capillary 199 (*) 70 - 99 mg/dL  GLUCOSE, CAPILLARY     Status: Abnormal   Collection Time  08/28/13  9:10 PM      Result Value Range   Glucose-Capillary 215 (*) 70 - 99 mg/dL  COMPREHENSIVE METABOLIC PANEL     Status: Abnormal   Collection Time    08/29/13  4:03 AM      Result Value Range   Sodium 135 (*) 137 - 147 mEq/L   Potassium 3.7  3.7 - 5.3 mEq/L   Chloride 96  96 - 112 mEq/L   CO2 22  19 - 32 mEq/L   Glucose, Bld 313 (*) 70 - 99 mg/dL   BUN 56 (*) 6 - 23 mg/dL   Creatinine, Ser 1.86 (*) 0.50 - 1.35 mg/dL   Calcium 7.8 (*) 8.4 - 10.5 mg/dL   Total Protein 6.4  6.0 - 8.3 g/dL   Albumin 2.8 (*) 3.5 - 5.2 g/dL   AST 36  0 - 37 U/L   ALT 33  0 - 53 U/L   Alkaline Phosphatase 102  39 - 117 U/L   Total Bilirubin 1.1  0.3 - 1.2 mg/dL   GFR calc non Af Amer 35 (*) >90 mL/min   GFR calc Af Amer 40 (*) >90 mL/min   Comment: (NOTE)     The eGFR has been calculated using the CKD EPI equation.     This calculation has not been validated in all clinical situations.     eGFR's persistently <90 mL/min signify possible Chronic Kidney     Disease.  CBC     Status: Abnormal   Collection Time    08/29/13  4:03 AM      Result Value Range   WBC 3.9 (*) 4.0 - 10.5 K/uL   RBC 3.10 (*) 4.22 - 5.81 MIL/uL   Hemoglobin 9.1 (*) 13.0 - 17.0 g/dL   HCT 28.4 (*) 39.0 - 52.0 %   MCV 91.6  78.0 - 100.0 fL   MCH 29.4  26.0 - 34.0 pg   MCHC 32.0  30.0 - 36.0 g/dL   RDW 17.7 (*) 11.5 - 15.5 %   Platelets 148 (*) 150 - 400 K/uL  PROTIME-INR     Status: Abnormal   Collection Time    08/29/13  4:03  AM      Result Value Range   Prothrombin Time 17.3 (*) 11.6 - 15.2 seconds   INR 1.45  0.00 - 1.49  GLUCOSE, CAPILLARY     Status: Abnormal   Collection Time    08/29/13  6:48 AM      Result Value Range   Glucose-Capillary 226 (*) 70 - 99 mg/dL   Comment 1 Documented in Chart     Comment 2 Notify RN    GLUCOSE, CAPILLARY     Status: Abnormal   Collection Time    08/29/13 11:08 AM      Result Value Range   Glucose-Capillary 173 (*) 70 - 99 mg/dL    EKG: NSR  IMAGING: No results found.  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 ? Continue Amiodarone. EKG confirms NSR.  Time Spent Directly with Patient: 45 minutes  Erlene Quan [phone] beeper 08/29/2013, 2:30 PM

## 2013-08-29 NOTE — Progress Notes (Signed)
Inpatient Diabetes Program Recommendations  AACE/ADA: New Consensus Statement on Inpatient Glycemic Control (2013)  Target Ranges:  Prepandial:   less than 140 mg/dL      Peak postprandial:   less than 180 mg/dL (1-2 hours)      Critically ill patients:  140 - 180 mg/dL  Results for [name], [name] (MRN [id number]) as of 08/29/2013 12:03  Ref. Range 08/28/2013 06:04 08/28/2013 11:29 08/28/2013 16:45 08/28/2013 21:10 08/29/2013 06:48 08/29/2013 11:08  Glucose-Capillary Latest Range: 70-99 mg/dL 256 (H) 130 (H) 199 (H) 215 (H) 226 (H) 173 (H)    Inpatient Diabetes Program Recommendations Insulin - Basal: consider increasing Levemir to 20 units  Thank you  Raoul Pitch BSN, RN,CDE Inpatient Diabetes Coordinator [phone] (team pager)

## 2013-08-30 ENCOUNTER — Encounter: Payer: Self-pay | Admitting: Internal Medicine

## 2013-08-30 ENCOUNTER — Encounter: Payer: Self-pay | Admitting: *Deleted

## 2013-08-30 DIAGNOSIS — N189 Chronic kidney disease, unspecified: Secondary | ICD-10-CM | POA: Diagnosis not present

## 2013-08-30 DIAGNOSIS — J449 Chronic obstructive pulmonary disease, unspecified: Secondary | ICD-10-CM | POA: Diagnosis not present

## 2013-08-30 DIAGNOSIS — N179 Acute kidney failure, unspecified: Secondary | ICD-10-CM | POA: Diagnosis not present

## 2013-08-30 DIAGNOSIS — E875 Hyperkalemia: Secondary | ICD-10-CM | POA: Diagnosis not present

## 2013-08-30 DIAGNOSIS — I5023 Acute on chronic systolic (congestive) heart failure: Secondary | ICD-10-CM | POA: Diagnosis not present

## 2013-08-30 LAB — COMPREHENSIVE METABOLIC PANEL
ALT: 46 U/L (ref 0–53)
AST: 39 U/L — AB (ref 0–37)
Albumin: 2.8 g/dL — ABNORMAL LOW (ref 3.5–5.2)
Alkaline Phosphatase: 120 U/L — ABNORMAL HIGH (ref 39–117)
BILIRUBIN TOTAL: 0.9 mg/dL (ref 0.3–1.2)
BUN: 51 mg/dL — AB (ref 6–23)
CHLORIDE: 97 meq/L (ref 96–112)
CO2: 22 meq/L (ref 19–32)
Calcium: 8 mg/dL — ABNORMAL LOW (ref 8.4–10.5)
Creatinine, Ser: 1.61 mg/dL — ABNORMAL HIGH (ref 0.50–1.35)
GFR calc Af Amer: 48 mL/min — ABNORMAL LOW (ref >90)
GFR, EST NON AFRICAN AMERICAN: 41 mL/min — AB (ref >90)
Glucose, Bld: 376 mg/dL — ABNORMAL HIGH (ref 70–99)
Potassium: 4.2 mEq/L (ref 3.7–5.3)
Sodium: 134 mEq/L — ABNORMAL LOW (ref 137–147)
Total Protein: 6.7 g/dL (ref 6.0–8.3)

## 2013-08-30 LAB — CBC
HCT: 31 % — ABNORMAL LOW (ref 39.0–52.0)
Hemoglobin: 10.1 g/dL — ABNORMAL LOW (ref 13.0–17.0)
MCH: 29.8 pg (ref 26.0–34.0)
MCHC: 32.6 g/dL (ref 30.0–36.0)
MCV: 91.4 fL (ref 78.0–100.0)
PLATELETS: 163 10*3/uL (ref 150–400)
RBC: 3.39 MIL/uL — AB (ref 4.22–5.81)
RDW: 17.7 % — AB (ref 11.5–15.5)
WBC: 4.1 10*3/uL (ref 4.0–10.5)

## 2013-08-30 LAB — GLUCOSE, CAPILLARY
GLUCOSE-CAPILLARY: 267 mg/dL — AB (ref 70–99)
Glucose-Capillary: 409 mg/dL — ABNORMAL HIGH (ref 70–99)
Glucose-Capillary: 265 mg/dL — ABNORMAL HIGH (ref 70–99)
Glucose-Capillary: 238 mg/dL — ABNORMAL HIGH (ref 70–99)

## 2013-08-30 MED ORDER — PREDNISONE 20 MG PO TABS: 40.0000 mg | ORAL_TABLET | Freq: Every day | ORAL | Status: DC

## 2013-08-30 MED ORDER — PREDNISONE 10 MG PO TABS
60.0000 mg | ORAL_TABLET | Freq: Every day | ORAL | Status: DC
  Administered 2013-08-30 – 2013-08-31 (×2): 60 mg via ORAL
  Filled 2013-08-30 (×3): qty 1

## 2013-08-30 MED ORDER — DOXYCYCLINE HYCLATE 100 MG PO TABS
100.0000 mg | ORAL_TABLET | Freq: Two times a day (BID) | ORAL | Status: DC
  Administered 2013-08-30 – 2013-09-02 (×7): 100 mg via ORAL
  Filled 2013-08-30 (×8): qty 1

## 2013-08-30 MED ORDER — INSULIN DETEMIR 100 UNIT/ML ~~LOC~~ SOLN
35.0000 [IU] | Freq: Every day | SUBCUTANEOUS | Status: DC
  Administered 2013-08-30 – 2013-09-01 (×3): 35 [IU] via SUBCUTANEOUS
  Filled 2013-08-30 (×5): qty 0.35

## 2013-08-30 NOTE — Progress Notes (Signed)
1400 Repeat cbg done 263  Dr. Olevia Bowens aware . With orders .Noon insuli skipped as per Dr. Olevia Bowens . Wiill monitor

## 2013-08-30 NOTE — Progress Notes (Signed)
Subjective: Currently undergoing a breathing treatment. He states that he is breathing a bit better. No CP.   Objective: Vital signs in last 24 hours: Temp:  [98 F (36.7 C)-98.1 F (36.7 C)] 98 F (36.7 C) (01/27 0612) Pulse Rate:  [78-88] 78 (01/27 0612) Resp:  [18] 18 (01/27 0612) BP: (90-99)/(43-60) 99/60 mmHg (01/27 0612) SpO2:  [95 %-100 %] 100 % (01/27 0612) Weight:  [153 lb (69.4 kg)] 153 lb (69.4 kg) (01/27 0612) Last BM Date: 08/28/13  Intake/Output from previous day: 01/26 0701 - 01/27 0700 In: 580 [P.O.:580] Out: 1250 [Urine:1250] Intake/Output this shift: Total I/O In: -  Out: 200 [Urine:200]  Medications Current Facility-Administered Medications  Medication Dose Route Frequency Provider Last Rate Last Dose  . carvedilol (COREG) tablet 3.125 mg  3.125 mg Oral BID WC Cherene Altes, MD      . furosemide (LASIX) tablet 20 mg  20 mg Oral Daily Cherene Altes, MD   20 mg at 08/29/13 1822  . insulin aspart (novoLOG) injection 0-15 Units  0-15 Units Subcutaneous TID WC Cherene Altes, MD   2 Units at 08/29/13 1818  . insulin aspart (novoLOG) injection 4 Units  4 Units Subcutaneous TID WC Cherene Altes, MD      . insulin detemir (LEVEMIR) injection 15 Units  15 Units Subcutaneous QHS Cherene Altes, MD   15 Units at 08/29/13 2132  . ipratropium (ATROVENT) nebulizer solution 0.5 mg  0.5 mg Nebulization QID Cherene Altes, MD   0.5 mg at 08/30/13 5277  . levalbuterol (XOPENEX) nebulizer solution 0.63 mg  0.63 mg Nebulization Q3H PRN Cherene Altes, MD      . levalbuterol [facility]) nebulizer solution 0.63 mg  0.63 mg Nebulization QID Cherene Altes, MD   0.63 mg at 08/30/13 8242  . ondansetron (ZOFRAN) injection 4 mg  4 mg Intravenous Q6H PRN Doree Fudge, MD      . oxyCODONE (Oxy IR/ROXICODONE) immediate release tablet 5 mg  5 mg Oral Q6H PRN Joni Reining, DO      . pantoprazole (PROTONIX) EC tablet 40 mg  40 mg Oral BID Harolyn Rutherford, RPH   40 mg at 08/29/13 2129  . spironolactone (ALDACTONE) tablet 25 mg  25 mg Oral Daily Cherene Altes, MD   25 mg at 08/29/13 1112    PE: General appearance: alert, cooperative and no distress Lungs: rales bilaterally, rhonchi bilaterally and wheezes bilaterally Heart: regular rate and rhythm Extremities: 1+ bilateral pitting edema Pulses: 2+ and symmetric Skin: warm and dry Neurologic: Grossly normal  Lab Results:   Recent Labs  08/28/13 0630 08/29/13 0403 08/30/13 0556  WBC 4.8 3.9* 4.1  HGB 10.3* 9.1* 10.1*  HCT 31.3* 28.4* 31.0*  PLT 161 148* 163   BMET  Recent Labs  08/28/13 0630 08/29/13 0403 08/30/13 0556  NA 135* 135* 134*  K 3.6* 3.7 4.2  CL 96 96 97  CO2 24 22 22   GLUCOSE 260* 313* 376*  BUN 56* 56* 51*  CREATININE 1.89* 1.86* 1.61*  CALCIUM 8.3* 7.8* 8.0*   PT/INR  Recent Labs  08/28/13 0630 08/29/13 0403  LABPROT 17.4* 17.3*  INR 1.47 1.45   Cholesterol No results found for this basename: CHOL,  in the last 72 hours Cardiac Enzymes No components found with this basename: TROPONIN,  [facility],   Studies/Results: @RISRSLT2 @   Assessment/Plan  Principal Problem:   Upper GI hemorrhage- adm 08/23/13- endo showed gastritis Active  Problems:   HYPERLIPIDEMIA   CAD- CABG '96, inferior scar on Myoview 2011   PAF- he had been in NSR on Amiodarone   BENIGN PROSTATIC HYPERTROPHY   HTN (hypertension)   Type II or unspecified type diabetes mellitus with peripheral circulatory disorders, uncontrolled(250.72)   COPD (chronic obstructive pulmonary disease)   Acute on chronic systolic heart failure   Bladder tumor- recently diagnosed, he will need cystoscopy   Hyperkalemia on admission   Metabolic acidosis   Warfarin-induced coagulopathy- Coumadin reversed and ultimatly discontinued   Cirrhosis of liver with ascites- s/p paracentesis   Gastritis   Acute-on-chronic renal failure- improving   Cardiomyopathy, ischemic- EF 35-40% by echo  08/24/13   Sleep apnea- on O2 at night  Plan: Pt's BP remains soft at 99/50, so HF therapies remain limited. He is on a low dose of Lasix as well as a low dose of aldactone. Recommend starting a low dose BB when SBP consistently >100 mgHg (carvedilol 3.125 mg BID).  No ACE-I in combination with aldactone. HR and BP both stable. SCr is trending downward at 1.61. Not a candidate for advanced HF therapies.      Brittainy M. Ladoris Gene 08/30/2013 8:53 AM Patient seen and examined. I agree with the assessment and plan as detailed above. See also my additional thoughts below.   I have reviewed the medications. I agree with the adjustments have been made.  Dola Argyle, MD, [facility] 08/30/2013 11:38 AM

## 2013-08-30 NOTE — Progress Notes (Signed)
[facility] PROGRESS NOTE Interim History: 73 year old male patient brought to the ED by EMS. 24 hours prior to presentation the patient had blood work done at his PCP's office which demonstrated marked hyperkalemia and other lab abnormalities.Laboratory data in the ER revealed INR elevated at 4.01. He was subsequently admitted to the ICU. He was given vitamin K to correct his coagulopathy. He was given Kayexalate to treat his hyperkalemia. His abdominal ultrasound was consistent with cirrhosis and ascites. It was suspected he may have been hypervolemic with possible cardiorenal syndrome so he initially was given IV Lasix. Gastroenterology was consulted as well as Nephrology. Renal ultrasound revealed no evidence of obstruction. Patient has not required hemodialysis.  Assessment/Plan: Cryptogenic Cirrhosis of liver with ascites - Amiodarone?  - Anti-smooth muscle ab pending. - Relatively new finding, CT scan 2001 - [place] of liver March 2014 noted enlarged liver but no parenchymal findings. - Patient endorses only minimal alcohol intake and hx appears reliable. - Paracentesis done fluid not convincing for SBP. - Ruled out wilson's disease w/ normal ceruloplasmin. - ANA negative - mitochondrial ab normal -alpha-1-antitrypsin level normal. - Ferritin < 300 therefore not hemochromatosis. - Acute viral hepatitis studies negative. - I am suspicious that Amio may be the offending agent . - follow on tele w/ d/c of amio - recovery, if happens, will be slow if this is due to amiodarone. - Physical therapy SNF.  Acute renal failure / Hyperkalemia / Metabolic acidosis  - Multifactorial: low output congestive heart failure, AKI, & recent blood loss from GI bleed  - BUN was markedly elevated at 149 with a creatinine of 4.66 at presentation  - Cr continues to slowly improve. - basleine 1.4-1.5.  Mild upper GI hemorrhage due to Acute esophagitis/Gastritis  - Appreciate GI assistance  - No further  bleeding since admission  - EGD 1/22 revealed evidence of esophagitis but no varices - biopsy pending including H. pylori specimen  - Continue PPI.  Acute blood loss anemia  - Hemoglobin has remained stable  - due to gastritis.  Hypotension  - Multifactorial due to low output heart failure, plus aggressive diuresis at time of admission and imdur - Was not orthostatic when checked  - Now resolved.  Warfarin-induced coagulopathy + cirrhosis related coagulopathy  - Was given vitamin K at admission - INR now 1.47, suspect had underlying coagulopathy due to liver disease  - Will not resume coumadin for now - - Considering use of novel anticoagulation agent instead  - Cardiology input valuable for NOAC's.  COPD  - Wheezing still today, cont steroids add doxy. 40 year pack smoking. - Xopenx, add Atrovent. - Check saturation with ambulation.  Acute Hypoxemic respiratory failure  - Appeared to be mechanical in nature given abdominal distention for persistent ascites  - Chest x-ray at time of admission only revealed mild diffuse pulmonary vascular congestion without overt pulmonary edema  - Although not requiring oxygen at rest continuing to wheeze is noted above suspect concerning for possible cardiac  wheezing (see below)  - Check ambulatory pulse ox on room air since patient and endorsing dyspnea on exertion   Chronic systolic heart failure  - Echocardiogram this admission revealed EF of 35-40% - his EF has actually improved compared with previous. - I would stay away from Aldactone and ACE therapy combo. - Low dose lasix along with aldactone. Low dose beta blocker. - will GI follow up as an outpatient.  Atrial fibrillation s/p ablation  - Discontinue amiodarone as above  -  Coumadin preadmission not to be resumed for now due to cirrhosis. - no varicies on EGD ? If NOAC's might beneficial.  Hx of HTN  -BP still soft   Type II diabetes mellitus with peripheral circulatory  disorders, uncontrolled  -CBGs elevated - adjust tx and follow trend    Code Status: full Family Communication: none  Disposition Plan: inpatient   Consultants:  Cardiology  PCCM  GI  Procedures:  EGD: grade 1 esophagitis, no varices  ECHO EF 35-40%  Antibiotics:  None  HPI/Subjective: Feel better no complains  Objective: Filed Vitals:   08/29/13 1523 08/29/13 1948 08/30/13 0612 08/30/13 0852  BP: 90/43  99/60   Pulse: 85 88 78   Temp: 98.1 F (36.7 C)  98 F (36.7 C)   TempSrc: Oral  Oral   Resp: 18 18 18    Height:      Weight:   69.4 kg (153 lb)   SpO2: 100% 95% 100% 98%    Intake/Output Summary (Last 24 hours) at 08/30/13 1036 Last data filed at 08/30/13 0919  Gross per 24 hour  Intake    580 ml  Output   1550 ml  Net   -970 ml   Filed Weights   08/28/13 0430 08/29/13 0630 08/30/13 0612  Weight: 67.949 kg (149 lb 12.8 oz) 66.9 kg (147 lb 7.8 oz) 69.4 kg (153 lb)    Exam:  General: Alert, awake, oriented x3, in no acute distress.  HEENT: No bruits, no goiter.  Heart: Regular rate and rhythm, without murmurs, rubs, gallops.  Lungs: Good air movement, bilateral air movement.  Abdomen: Soft, nontender, distended, positive bowel sounds.     Data Reviewed: Basic Metabolic Panel:  Recent Labs Lab 08/25/13 0715 08/26/13 0240 08/27/13 0330 08/28/13 0630 08/29/13 0403 08/30/13 0556  NA 144 142 139 135* 135* 134*  K 4.0 4.0 3.7 3.6* 3.7 4.2  CL 104 102 100 96 96 97  CO2 23 20 23 24 22 22   GLUCOSE 90 87 160* 260* 313* 376*  BUN 96* 71* 60* 56* 56* 51*  CREATININE 2.80* 2.02* 1.88* 1.89* 1.86* 1.61*  CALCIUM 9.1 9.5 8.5 8.3* 7.8* 8.0*  PHOS 4.5  --   --   --   --   --    Liver Function Tests:  Recent Labs Lab 08/26/13 0240 08/27/13 0330 08/28/13 0630 08/29/13 0403 08/30/13 0556  AST 21 44* 37 36 39*  ALT 23 27 30  33 46  ALKPHOS 75 100 111 102 120*  BILITOT 2.4* 2.0* 1.4* 1.1 0.9  PROT 7.7 7.0 7.0 6.4 6.7  ALBUMIN 3.4* 3.0*  2.9* 2.8* 2.8*    Recent Labs Lab 08/23/13 1534  LIPASE 71*    Recent Labs Lab 08/23/13 2207 08/28/13 0630  AMMONIA 45 10*   CBC:  Recent Labs Lab 08/23/13 1534  08/26/13 0240 08/27/13 0330 08/28/13 0630 08/29/13 0403 08/30/13 0556  WBC 4.0  < > 7.1 5.3 4.8 3.9* 4.1  NEUTROABS 2.8  --   --   --   --   --   --   HGB 9.5*  < > 10.8* 10.8* 10.3* 9.1* 10.1*  HCT 28.4*  < > 32.7* 32.5* 31.3* 28.4* 31.0*  MCV 88.8  < > 91.1 90.3 90.7 91.6 91.4  PLT 146*  < > 160 152 161 148* 163  < > = values in this interval not displayed. Cardiac Enzymes: No results found for this basename: CKTOTAL, CKMB, CKMBINDEX, TROPONINI,  in the last 168  hours BNP (last 3 results)  Recent Labs  11/29/12 0655 06/03/13 2108 08/23/13 1534  PROBNP 1278.0* 1390.0* 2953.0*   CBG:  Recent Labs Lab 08/28/13 2110 08/29/13 0648 08/29/13 1108 08/29/13 1636 08/29/13 2158  GLUCAP 215* 226* 173* 129* 230*    Recent Results (from the past 240 hour(s))  MRSA PCR SCREENING     Status: None   Collection Time    08/23/13 10:45 PM      Result Value Range Status   MRSA by PCR NEGATIVE  NEGATIVE Final   Comment:            The GeneXpert MRSA Assay (FDA     approved for NASAL specimens     only), is one component of a     comprehensive MRSA colonization     surveillance program. It is not     intended to diagnose MRSA     infection nor to guide or     monitor treatment for     MRSA infections.     Studies: No results found.  Scheduled Meds: . carvedilol  3.125 mg Oral BID WC  . furosemide  20 mg Oral Daily  . insulin aspart  0-15 Units Subcutaneous TID WC  . insulin aspart  4 Units Subcutaneous TID WC  . insulin detemir  15 Units Subcutaneous QHS  . ipratropium  0.5 mg Nebulization QID  . levalbuterol  0.63 mg Nebulization QID  . pantoprazole  40 mg Oral BID  . spironolactone  25 mg Oral Daily   Continuous Infusions:    Charlynne Cousins  [facility] Pager [phone]. If  8PM-8AM, please contact night-coverage at [email or web address], password [facility] 08/30/2013, 10:36 AM  LOS: 7 days

## 2013-08-30 NOTE — Progress Notes (Signed)
Physical Therapy Treatment Patient Details Name: [name] IDEN MRN: [id number] DOB: 1941-05-03 Today's Date: 08/30/2013 Time: 4481-8563 PT Time Calculation (min): 24 min  PT Assessment / Plan / Recommendation  History of Present Illness Pt admit with acute renal failure.   PT Comments   Pt admitted with above. Pt currently with functional limitations due to balance and endurance deficits.  Pt will benefit from skilled PT to increase their independence and safety with mobility to allow discharge to the venue listed below. Will need SNF for therapy to be able to return home.    Follow Up Recommendations  SNF;Supervision/Assistance - 24 hour                 Equipment Recommendations  Other (comment) (4 wheeled RW with seat)        Frequency Min 3X/week   Progress towards PT Goals Progress towards PT goals: Progressing toward goals  Plan Discharge plan needs to be updated    Precautions / Restrictions Precautions Precautions: Fall Restrictions Weight Bearing Restrictions: No   Pertinent Vitals/Pain VSS with sats at 94-98% on RA, DOE 3/4 however, HR 84-86bpm; no pain    Mobility  Bed Mobility Overal bed mobility: Independent Transfers Overall transfer level: Needs assistance Equipment used: 1 person hand held assist Transfers: Sit to/from Stand Sit to Stand: Min guard General transfer comment: Needs steadying assist as pt with poor balance reactions if perturbed or distracted.  Ambulation/Gait Ambulation/Gait assistance: Min assist Ambulation Distance (Feet): 300 Feet Assistive device: 1 person hand held assist;None Gait Pattern/deviations: Decreased stride length Gait velocity interpretation: <1.8 ft/sec, indicative of risk for recurrent falls General Gait Details: Pt with overall instability with occasional LOB which he attempts to correct but at times needs steadying due to overcorrecting. Pt continues to agree that he needs RW with seat.  Pt DOE3/4 prior to end  of  walk and he stated he did not think he was going to make it back.  With standing rest breaks, pt made it back to bed to sit and rest.      Exercises General Exercises - Lower Extremity Ankle Circles/Pumps: AROM;Both;5 reps;Supine Long Arc Quad: AROM;Both;10 reps;Seated Hip Flexion/Marching: AROM;Both;10 reps;Seated    PT Goals (current goals can now be found in the care plan section)    Visit Information  Last PT Received On: 08/30/13 Assistance Needed: +1 History of Present Illness: Pt admit with acute renal failure.    Subjective Data  Subjective: "I feel so out of breath when I walk."   Cognition  Cognition Arousal/Alertness: Awake/alert Behavior During Therapy: WFL for tasks assessed/performed Overall Cognitive Status: Within Functional Limits for tasks assessed    Balance  Balance Overall balance assessment: Needs assistance Sitting-balance support: No upper extremity supported;Feet supported Sitting balance-Leahy Scale: Good Standing balance support: No upper extremity supported;During functional activity Standing balance-Leahy Scale: Fair High level balance activites: Turns;Sudden stops;Direction changes High Level Balance Comments: needs steadying assist.  End of Session PT - End of Session Equipment Utilized During Treatment: Gait belt Activity Tolerance: Patient limited by fatigue Patient left: in bed;with call bell/phone within reach Nurse Communication: Mobility status        INGOLD,Alee Katen 08/30/2013, 10:53 AM [facility] [phone] (pager)

## 2013-08-31 ENCOUNTER — Inpatient Hospital Stay (HOSPITAL_COMMUNITY): Payer: Medicare HMO

## 2013-08-31 DIAGNOSIS — D62 Acute posthemorrhagic anemia: Secondary | ICD-10-CM | POA: Diagnosis not present

## 2013-08-31 DIAGNOSIS — E875 Hyperkalemia: Secondary | ICD-10-CM | POA: Diagnosis not present

## 2013-08-31 DIAGNOSIS — K209 Esophagitis, unspecified without bleeding: Secondary | ICD-10-CM | POA: Diagnosis not present

## 2013-08-31 DIAGNOSIS — I5023 Acute on chronic systolic (congestive) heart failure: Secondary | ICD-10-CM | POA: Diagnosis not present

## 2013-08-31 LAB — GLUCOSE, CAPILLARY
GLUCOSE-CAPILLARY: 272 mg/dL — AB (ref 70–99)
GLUCOSE-CAPILLARY: 172 mg/dL — AB (ref 70–99)
GLUCOSE-CAPILLARY: 235 mg/dL — AB (ref 70–99)
Glucose-Capillary: 233 mg/dL — ABNORMAL HIGH (ref 70–99)

## 2013-08-31 MED ORDER — LEVALBUTEROL HCL 0.63 MG/3ML IN NEBU
0.6300 mg | INHALATION_SOLUTION | Freq: Three times a day (TID) | RESPIRATORY_TRACT | Status: DC
  Administered 2013-08-31 – 2013-09-02 (×6): 0.63 mg via RESPIRATORY_TRACT
  Filled 2013-08-31 (×11): qty 3

## 2013-08-31 MED ORDER — BUDESONIDE 0.25 MG/2ML IN SUSP
0.2500 mg | Freq: Two times a day (BID) | RESPIRATORY_TRACT | Status: DC
  Administered 2013-09-01 – 2013-09-02 (×3): 0.25 mg via RESPIRATORY_TRACT
  Filled 2013-08-31 (×6): qty 2

## 2013-08-31 MED ORDER — IPRATROPIUM BROMIDE 0.02 % IN SOLN
0.5000 mg | Freq: Three times a day (TID) | RESPIRATORY_TRACT | Status: DC
  Administered 2013-08-31 – 2013-09-02 (×6): 0.5 mg via RESPIRATORY_TRACT
  Filled 2013-08-31 (×6): qty 2.5

## 2013-08-31 MED ORDER — PREDNISONE 20 MG PO TABS
40.0000 mg | ORAL_TABLET | Freq: Every day | ORAL | Status: DC
  Administered 2013-09-01 – 2013-09-02 (×2): 40 mg via ORAL
  Filled 2013-08-31 (×3): qty 2

## 2013-08-31 NOTE — Progress Notes (Signed)
Patient Name: [name] Date of Encounter: 08/31/2013  Principal Problem:   Upper GI hemorrhage- adm 08/23/13- endo showed gastritis Active Problems:   HYPERLIPIDEMIA   CAD- CABG '96, inferior scar on Myoview 2011   PAF- he had been in NSR on Amiodarone   BENIGN PROSTATIC HYPERTROPHY   HTN (hypertension)   Type II or unspecified type diabetes mellitus with peripheral circulatory disorders, uncontrolled(250.72)   COPD (chronic obstructive pulmonary disease)   Acute on chronic systolic heart failure   Bladder tumor- recently diagnosed, he will need cystoscopy   Hyperkalemia on admission   Metabolic acidosis   Warfarin-induced coagulopathy- Coumadin reversed and ultimatly discontinued   Cirrhosis of liver with ascites- s/p paracentesis   Gastritis   Acute-on-chronic renal failure- improving   Cardiomyopathy, ischemic- EF 35-40% by echo 08/24/13   Sleep apnea- on O2 at night    SUBJECTIVE: Says is breathing better, no chest pain. Wants to know when he can go to SNF.   OBJECTIVE Filed Vitals:   08/31/13 0901 08/31/13 0906 08/31/13 0907 08/31/13 1001  BP: 98/56 93/56 98/56    Pulse: 72 72    Temp:  98.4 F (36.9 C)    TempSrc:  Oral    Resp:  20    Height:      Weight:      SpO2:  100%  100%    Intake/Output Summary (Last 24 hours) at 08/31/13 1100 Last data filed at 08/31/13 1043  Gross per 24 hour  Intake    840 ml  Output   1376 ml  Net   -536 ml   Filed Weights   08/29/13 0630 08/30/13 0612 08/31/13 0604  Weight: 147 lb 7.8 oz (66.9 kg) 153 lb (69.4 kg) 156 lb 4.9 oz (70.9 kg)    PHYSICAL EXAM General: Well developed, well nourished, male in no acute distress. Head: Normocephalic, atraumatic.  Neck: Supple without bruits, JVD 8-9 cm. Lungs:  Resp regular and unlabored, bilateral rales, L>R. Heart: RRR, S1, S2, no S3, S4, 2/6 murmur; no rub. Abdomen: firm, distended, non-tender, BS + x 4.  Extremities: No clubbing, cyanosis, no edema.  Neuro: Alert  and oriented X 3. Moves all extremities spontaneously. Psych: Normal affect.  LABS: CBC: Recent Labs  08/29/13 0403 08/30/13 0556  WBC 3.9* 4.1  HGB 9.1* 10.1*  HCT 28.4* 31.0*  MCV 91.6 91.4  PLT 148* 163   INR: Recent Labs  08/29/13 0403  INR 1.61   Basic Metabolic Panel: Recent Labs  08/29/13 0403 08/30/13 0556  NA 135* 134*  K 3.7 4.2  CL 96 97  CO2 22 22  GLUCOSE 313* 376*  BUN 56* 51*  CREATININE 1.86* 1.61*  CALCIUM 7.8* 8.0*   Liver Function Tests: Recent Labs  08/29/13 0403 08/30/13 0556  AST 36 39*  ALT 33 46  ALKPHOS 102 120*  BILITOT 1.1 0.9  PROT 6.4 6.7  ALBUMIN 2.8* 2.8*   BNP: Pro B Natriuretic peptide (BNP)  Date/Time Value Range Status  08/23/2013  3:34 PM 2953.0* 0 - 125 pg/mL Final  06/03/2013  9:08 PM 1390.0* 0 - 125 pg/mL Final   TELE:  Previously SR, ?accelerated Junctional - will ck ECG      Radiology/Studies: Dg Chest Port 1 View 08/25/2013   CLINICAL DATA:  Dyspnea and hypoxia and hypotension  EXAM: PORTABLE CHEST - 1 VIEW  COMPARISON:  Portable chest x-ray of August 23, 2013  FINDINGS: The lungs are reasonably well inflated. The  interstitial markings have increased in conspicuity, bilaterally. The cardiopericardial silhouette is mildly enlarged. The pulmonary vascularity is less distinct and appears mildly engorged. There is no pleural effusion. The patient has undergone previous CABG.  IMPRESSION: The findings are consistent with congestive heart failure with pulmonary interstitial edema. There is no pleural effusion or alveolar infiltrate.   Electronically Signed   By: David  Martinique   On: 08/25/2013 16:10     Current Medications:  . carvedilol  3.125 mg Oral BID WC  . doxycycline  100 mg Oral Q12H  . furosemide  20 mg Oral Daily  . insulin aspart  0-15 Units Subcutaneous TID WC  . insulin aspart  4 Units Subcutaneous TID WC  . insulin detemir  35 Units Subcutaneous QHS  . ipratropium  0.5 mg Nebulization QID  .  levalbuterol  0.63 mg Nebulization QID  . pantoprazole  40 mg Oral BID  . predniSONE  60 mg Oral Q breakfast  . spironolactone  25 mg Oral Daily      ASSESSMENT AND PLAN:  AoC Systolic CHF: Wt 315>>400>>867 today. Since weight is trending up, will recheck CXR and get 2-view. For now, continue current Lasix dose but will likely need more/IV. Renal function is improving so will get more data before making med changes.   PAF - SR and possibly Junctional rhythm, will ck ECG. Hold low-dose Coreg since 2/3 doses held because of SBP < 100. Follow.   Otherwise, per primary MD.    Signed, Rosaria Ferries , PA-C 11:00 AM 08/31/2013 Patient seen and examined. I agree with the assessment and plan as detailed above. See also my additional thoughts below.   I reviewed all the data and agree with that note. We will continue to follow.  Dola Argyle, MD, [facility] 08/31/2013 12:30 PM

## 2013-08-31 NOTE — Progress Notes (Signed)
UR completed Nadiya Pieratt K. Enma Maeda, RN, BSN, MSHL, CCM  08/31/2013 5:15 PM

## 2013-08-31 NOTE — Progress Notes (Signed)
Physical Therapy Treatment Patient Details Name: [name] MRN: [id number] DOB: February 23, 1941 Today's Date: 08/31/2013 Time: 9242-6834 PT Time Calculation (min): 26 min  PT Assessment / Plan / Recommendation  History of Present Illness pt reports he still gets winded when he walks   PT Comments   Steady progress, but still with decreased endurance and limited functional reserve once patient gets fatigued  Follow Up Recommendations        Does the patient have the potential to tolerate intense rehabilitation     Barriers to Discharge        Equipment Recommendations       Recommendations for Other Services    Frequency     Progress towards PT Goals Progress towards PT goals: Progressing toward goals  Plan Current plan remains appropriate    Precautions / Restrictions Precautions Precautions: Fall Restrictions Weight Bearing Restrictions: No   Pertinent Vitals/Pain No pain. Dyspnea and leg weakness with fatigue requiring standing rest breaks leaning on something or return to sitting    Mobility  Transfers Overall transfer level: Modified independent Equipment used: 1 person hand held assist Sit to Stand: Supervision General transfer comment: Needs steadying assist as pt with poor balance reactions if perturbed or distracted.  Ambulation/Gait Ambulation/Gait assistance: Supervision Ambulation Distance (Feet): 250 Feet Assistive device: 1 person hand held assist;None General Gait Details: Pt feels better today, but at end of walk he felt weakness in legs and needed 3 standing rest breaks to regain strength in legs and control dyspnea prior to reaching his chair. Agree he will benefit from 4 wheeled walker with a seat to rest as needed    Exercises General Exercises - Lower Extremity Long Arc Quad: AROM;Both;10 reps;Seated Hip Flexion/Marching: AROM;10 reps;Seated Toe Raises: AROM;10 reps;Seated Heel Raises: AROM;10 reps;Seated Other Exercises Other Exercises:  repeated sit to stand x 5,but pt felt winded after each attempt Other Exercises: bilateral upper extremity UE raises to shoulder height and moving from hip to hip   PT Diagnosis:    PT Problem List:   PT Treatment Interventions:     PT Goals (current goals can now be found in the care plan section)    Visit Information  Last PT Received On: 08/31/13 History of Present Illness: pt reports he still gets winded when he walks    Subjective Data      Cognition  Cognition Arousal/Alertness: Awake/alert Behavior During Therapy: WFL for tasks assessed/performed Overall Cognitive Status: Within Functional Limits for tasks assessed    Balance  Balance Overall balance assessment: Needs assistance Sitting-balance support: Feet supported;No upper extremity supported Sitting balance-Leahy Scale: Good Sitting balance - Comments: pt was able to do 5 reps of raising arms with back away from chair to activate core muscles Standing balance support: No upper extremity supported;During functional activity Standing balance-Leahy Scale: Good Standing balance comment: no balance loss today General Comments General comments (skin integrity, edema, etc.): Pitting edema noted in left leg  End of Session PT - End of Session Activity Tolerance: Patient limited by fatigue Patient left: in chair;with call bell/phone within reach   GP    Helene Kelp K. [place], [place] 08/31/2013, 4:16 PM

## 2013-08-31 NOTE — Progress Notes (Signed)
CSW met with patient today and provided bed offers. He has chosen [facility]- however, it should be noted that patient is progressing well with PT and it is quite possible that [facility] will not give authorization for short term SNF. Discussed this possibility with patient and he verbalized understanding and will plan to return home if he cannot go to SNF. CSW left message for [facility] to call back re: SNF placement;  Awaiting MD's decision re: stability.  Lorie Phenix. [place], [place]

## 2013-08-31 NOTE — Progress Notes (Signed)
I cosign with Talmadge Chad Student RN on all assessments, notes, I/O, and medication passes for this shift. Ronnette Hila, RN

## 2013-08-31 NOTE — Progress Notes (Signed)
[facility] PROGRESS NOTE Interim History: 73 year old male patient brought to the ED by EMS. 24 hours prior to presentation the patient had blood work done at his PCP's office which demonstrated marked hyperkalemia and other lab abnormalities.Laboratory data in the ER revealed INR elevated at 4.01. He was subsequently admitted to the ICU. He was given vitamin K to correct his coagulopathy. He was given Kayexalate to treat his hyperkalemia. His abdominal ultrasound was consistent with cirrhosis and ascites. It was suspected he may have been hypervolemic with possible cardiorenal syndrome so he initially was given IV Lasix. Gastroenterology was consulted as well as Nephrology. Renal ultrasound revealed no evidence of obstruction. Patient has not required hemodialysis.  Assessment/Plan: Cryptogenic Cirrhosis of liver with ascites - Amiodarone?  - Anti-smooth muscle ab pending. - Relatively new finding, CT scan 2001 - [place] of liver March 2014 noted enlarged liver but no parenchymal findings. - Patient endorses only minimal alcohol intake and hx appears reliable. - Paracentesis done fluid not convincing for SBP. - Ruled out wilson's disease w/ normal ceruloplasmin. - ANA negative - mitochondrial ab normal -alpha-1-antitrypsin level normal. - Ferritin < 300 therefore not hemochromatosis. - Acute viral hepatitis studies negative. - I am suspicious that Amio may be the offending agent . - follow on tele, continue off amio - recovery, if happens, will be slow if this is due to amiodarone. - Physical therapy recommended SNF at discharge.  Acute renal failure / Hyperkalemia / Metabolic acidosis  - Multifactorial: low output congestive heart failure, AKI, & recent blood loss from GI bleed  - BUN was markedly elevated at 149 with a creatinine of 4.66 at presentation  - Cr continues to slowly improve. - basleine 1.4-1.5. -Cr on 1/27 1.6 -BMET in am   Mild upper GI hemorrhage due to Acute  esophagitis/Gastritis  - Appreciate GI assistance  - No further bleeding since admission  - EGD 1/22 revealed evidence of esophagitis but no varices - biopsy pending including H. pylori specimen  - Continue PPI  Acute blood loss anemia  - Hemoglobin has remained stable  - due to gastritis/ GI bleed (see above).  Hypotension  - Multifactorial due to low output heart failure, plus aggressive diuresis at time of admission and imdur - Was not orthostatic when checked  - BP continue to be less than 100 -coreg now on hold -will monitor VS.  Warfarin-induced coagulopathy + cirrhosis related coagulopathy  - Was given vitamin K at admission - INR reversed to 1.47, suspect had underlying coagulopathy due to liver disease  - Will not resume coumadin for now - - Considering use of novel anticoagulation agent instead  - Cardiology input valuable for NOAC's at discharge.  COPD  - Wheezing resolving -will continue slow tapering prednisone -add pulmicort -continue abx's - Xopenex PRN  Acute Hypoxemic respiratory failure  - Appeared to be mechanical in nature given abdominal distention for persistent ascites  - Chest x-ray at time of admission only revealed mild diffuse pulmonary vascular congestion without overt pulmonary edema  -CXR to be repeated in am  Chronic systolic heart failure  - Echocardiogram this admission revealed EF of 35-40% - his EF has actually improved compared with previous. - I would stay away from Aldactone and ACE therapy combo. - Low dose lasix along with aldactone. Low dose beta blocker. - will follow cardiology rec's -due to low BP coreg now on hold -patient weight is rising  Atrial fibrillation s/p ablation  - Discontinue amiodarone as above  - Coumadin  preadmission not to be resumed for now due to cirrhosis. - no varicies on EGD ? If NOAC's might beneficial at discharge 1-2 weeks from now.  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 BP still soft and weight is going up.  Objective: Filed Vitals:   08/31/13 0906 08/31/13 0907 08/31/13 1001 08/31/13 1419  BP: 93/56 98/56  94/47  Pulse: 72   84  Temp: 98.4 F (36.9 C)   97.3 F (36.3 C)  TempSrc: Oral   Oral  Resp: 20   20  Height:      Weight:      SpO2: 100%  100% 99%    Intake/Output Summary (Last 24 hours) at 08/31/13 1824 Last data filed at 08/31/13 1400  Gross per 24 hour  Intake    600 ml  Output   1425 ml  Net   -825 ml   Filed Weights   08/29/13 0630 08/30/13 0612 08/31/13 0604  Weight: 66.9 kg (147 lb 7.8 oz) 69.4 kg (153 lb) 70.9 kg (156 lb 4.9 oz)    Exam:  General: Alert, awake, oriented x3, in no acute distress.  HEENT: No bruits, no goiter.  Heart: Regular rate and rhythm, without murmurs, rubs, gallops.  Lungs: Good air movement, bilateral air movement.  Abdomen: Soft, nontender, positive distensions and waive signs on exam.    Data Reviewed: Basic Metabolic Panel:  Recent Labs Lab 08/25/13 0715 08/26/13 0240 08/27/13 0330 08/28/13 0630 08/29/13 0403 08/30/13 0556  NA 144 142 139 135* 135* 134*  K 4.0 4.0 3.7 3.6* 3.7 4.2  CL 104 102 100 96 96 97  CO2 23 20 23 24 22 22   GLUCOSE 90 87 160* 260* 313* 376*  BUN 96* 71* 60* 56* 56* 51*  CREATININE 2.80* 2.02* 1.88* 1.89* 1.86* 1.61*  CALCIUM 9.1 9.5 8.5 8.3* 7.8* 8.0*  PHOS 4.5  --   --   --   --   --    Liver Function Tests:  Recent Labs Lab 08/26/13 0240 08/27/13 0330 08/28/13 0630 08/29/13 0403 08/30/13 0556  AST 21 44* 37 36 39*  ALT 23 27 30  33 46  ALKPHOS 75 100 111 102  120*  BILITOT 2.4* 2.0* 1.4* 1.1 0.9  PROT 7.7 7.0 7.0 6.4 6.7  ALBUMIN 3.4* 3.0* 2.9* 2.8* 2.8*   No results found for this basename: LIPASE, AMYLASE,  in the last 168 hours  Recent Labs Lab 08/28/13 0630  AMMONIA 10*   CBC:  Recent Labs Lab 08/26/13 0240 08/27/13 0330 08/28/13 0630 08/29/13 0403 08/30/13 0556  WBC 7.1 5.3 4.8 3.9* 4.1  HGB 10.8* 10.8* 10.3* 9.1* 10.1*  HCT 32.7* 32.5* 31.3* 28.4* 31.0*  MCV 91.1 90.3 90.7 91.6 91.4  PLT 160 152 161 148* 163   Cardiac Enzymes: No results found for this basename: CKTOTAL, CKMB, CKMBINDEX, TROPONINI,  in the last 168 hours BNP (last 3 results)  Recent Labs  11/29/12 0655 06/03/13 2108 08/23/13 1534  PROBNP 1278.0* 1390.0* 2953.0*   CBG:  Recent Labs Lab 08/30/13 1617 08/30/13 2135 08/31/13 0557 08/31/13 1142 08/31/13  [place]*    Recent Results (from the past 240 hour(s))  MRSA PCR SCREENING     Status: None   Collection Time    08/23/13 10:45 PM      Result Value Range Status   MRSA by PCR NEGATIVE  NEGATIVE Final   Comment:            The GeneXpert MRSA Assay (FDA     approved for NASAL specimens     only), is one component of a     comprehensive MRSA colonization     surveillance program. It is not     intended to diagnose MRSA     infection nor to guide or     monitor treatment for     MRSA infections.     Studies: Dg Chest 2 View  08/31/2013   CLINICAL DATA:  Short of breath.  EXAM: CHEST  2 VIEW  COMPARISON:  08/25/2013  FINDINGS: There are prominent bronchovascular markings. Mild interstitial thickening is noted most evident in the peripheral lung bases. The lungs are mildly hyperexpanded. There are no focal areas consolidation. No convincing pulmonary edema. There is no pleural effusion or pneumothorax.  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 The cardiac silhouette is mildly enlarged. There are normal mediastinal and hilar contours.  The bony thorax is  demineralized but intact.  IMPRESSION: No acute cardiopulmonary disease.   Electronically Signed   By: Lajean Manes M.D.   On: 08/31/2013 16:15    Scheduled Meds: . budesonide (PULMICORT) nebulizer solution  0.25 mg Nebulization BID  . doxycycline  100 mg Oral Q12H  . furosemide  20 mg Oral Daily  . insulin aspart  0-15 Units Subcutaneous TID WC  . insulin aspart  4 Units Subcutaneous TID WC  . insulin detemir  35 Units Subcutaneous QHS  . ipratropium  0.5 mg Nebulization TID  . levalbuterol  0.63 mg Nebulization TID  . pantoprazole  40 mg Oral BID  . [START ON 09/01/2013] predniSONE  40 mg Oral Q breakfast  . spironolactone  25 mg Oral Daily   Continuous Infusions:    Ernst Cumpston  [facility] Pager [phone]. If 8PM-8AM, please contact night-coverage at [email or web address], password [facility] 08/31/2013, 6:24 PM  LOS: 8 days

## 2013-08-31 NOTE — Progress Notes (Signed)
Patient evaluated for community based chronic disease management services with [facility] as a benefit of patient's [employer]. Spoke with patient at bedside to explain [facility] services.  Written consents obtained.  He was admitted on 1.20.15 with hyperkalemia. The patient is a 73 yo man, history of CHF (EF25-30%), afib on coumadin, DM, prior CVA, CAD, recently diagnosed bladder tumor, presenting with blood in stools. The patient notes occasional dark, tarry stools for the last 2-3 weeks, with 1 episode of bright red blood on 1.19.15.  Around this time, he also notes increased heartburn, epigastric pain, nausea, vomiting x2 (non-bloody, though once after eating red soup), generalized weakness/decreased exercise tolerance, and increasing abdominal distention, with a 10 pound weight gain. The patient notes no alcohol or NSAID usage in the last few weeks, and no history of upper gastric bleed; INR = 4 on admission. The patient notes continued diuretic usage of torsemide, spironolactone, and metolazone during this time, with decreased PO intake, and was found on admission to have Acute Kidney Injury and hyperkalemia. The patient's BP has been in the SBP 90-100's since admission, which per chart review is around the patient's baseline, with a normal lactic acid level.  Patient is going to SNF for short term rehabilitation prior to his return to home.  Palliative Care will consult at the SNF.  [facility]/NTSP will monitor the progress of his SNF course of care. Upon discharge [facility] will be contacted by [facility].  We will then provide transition services.  Patient will receive a post discharge transition of care call and will be evaluated for monthly home visits for assessments and disease process education.  Left contact information and [facility] literature at bedside. Made Inpatient Case Manager aware that [facility] following. Of note, [facility] Care Management services does not  replace or interfere with any services that are arranged by inpatient case management or social work.  For additional questions or referrals please contact Corliss Blacker BSN RN [facility] Liaison at [phone].

## 2013-08-31 NOTE — Progress Notes (Signed)
Inpatient Diabetes Program Recommendations  AACE/ADA: New Consensus Statement on Inpatient Glycemic Control (2013)  Target Ranges:  Prepandial:   less than 140 mg/dL      Peak postprandial:   less than 180 mg/dL (1-2 hours)      Critically ill patients:  140 - 180 mg/dL   Noted discontinuation of Solumedrol and now on Prednisone (which primarily effect the post-prandial glucose levels) At this point, would recommend the following:  Inpatient Diabetes Program Recommendations Insulin - Basal: xxx Correction (SSI): Please consider addition of the HS correction scale which begins at 200 mg/dL with 2 units.  Thank you, Rosita Kea, RN, CNS, Diabetes Coordinator [phone])

## 2013-09-01 ENCOUNTER — Ambulatory Visit: Payer: Medicare HMO

## 2013-09-01 DIAGNOSIS — I5023 Acute on chronic systolic (congestive) heart failure: Secondary | ICD-10-CM | POA: Diagnosis not present

## 2013-09-01 DIAGNOSIS — N179 Acute kidney failure, unspecified: Secondary | ICD-10-CM | POA: Diagnosis not present

## 2013-09-01 DIAGNOSIS — I4891 Unspecified atrial fibrillation: Secondary | ICD-10-CM | POA: Diagnosis not present

## 2013-09-01 DIAGNOSIS — E1159 Type 2 diabetes mellitus with other circulatory complications: Secondary | ICD-10-CM

## 2013-09-01 DIAGNOSIS — D62 Acute posthemorrhagic anemia: Secondary | ICD-10-CM | POA: Diagnosis not present

## 2013-09-01 DIAGNOSIS — R188 Other ascites: Secondary | ICD-10-CM | POA: Diagnosis not present

## 2013-09-01 DIAGNOSIS — E875 Hyperkalemia: Secondary | ICD-10-CM | POA: Diagnosis not present

## 2013-09-01 LAB — GLUCOSE, CAPILLARY
Glucose-Capillary: 225 mg/dL — ABNORMAL HIGH (ref 70–99)
Glucose-Capillary: 153 mg/dL — ABNORMAL HIGH (ref 70–99)
Glucose-Capillary: 117 mg/dL — ABNORMAL HIGH (ref 70–99)
Glucose-Capillary: 200 mg/dL — ABNORMAL HIGH (ref 70–99)

## 2013-09-01 LAB — BASIC METABOLIC PANEL
BUN: 61 mg/dL — ABNORMAL HIGH (ref 6–23)
CALCIUM: 8.8 mg/dL (ref 8.4–10.5)
CO2: 22 meq/L (ref 19–32)
CREATININE: 1.63 mg/dL — AB (ref 0.50–1.35)
Chloride: 97 mEq/L (ref 96–112)
GFR calc Af Amer: 47 mL/min — ABNORMAL LOW (ref >90)
GFR, EST NON AFRICAN AMERICAN: 41 mL/min — AB (ref >90)
Glucose, Bld: 280 mg/dL — ABNORMAL HIGH (ref 70–99)
Potassium: 4.8 mEq/L (ref 3.7–5.3)
SODIUM: 133 meq/L — AB (ref 137–147)

## 2013-09-01 MED ORDER — ALBUMIN HUMAN 25 % IV SOLN
25.0000 g | Freq: Four times a day (QID) | INTRAVENOUS | Status: DC
  Administered 2013-09-01: 25 g via INTRAVENOUS
  Filled 2013-09-01 (×3): qty 100

## 2013-09-01 MED ORDER — ALBUMIN HUMAN 25 % IV SOLN
25.0000 g | Freq: Four times a day (QID) | INTRAVENOUS | Status: AC
  Administered 2013-09-02: 25 g via INTRAVENOUS
  Filled 2013-09-01: qty 100

## 2013-09-01 NOTE — Clinical Social Work Placement (Addendum)
     Clinical Social Work Department CLINICAL SOCIAL WORK PLACEMENT NOTE 09/04/2013  Patient:  [name], NURSE  Account Number:  [id number] Admit date:  08/23/2013  Clinical Social Worker:  Butch Penny Liza Czerwinski, LCSWA  Date/time:  08/31/2013 04:44 PM  Clinical Social Work is seeking post-discharge placement for this patient at the following level of care:   SKILLED NURSING   (*CSW will update this form in Epic as items are completed)   08/31/2013  Patient/family provided with [place] Department of Clinical Social Works list of facilities offering this level of care within the geographic area requested by the patient (or if unable, by the patients family).  08/31/2013  Patient/family informed of their freedom to choose among providers that offer the needed level of care, that participate in Medicare, Medicaid or managed care program needed by the patient, have an available bed and are willing to accept the patient.  08/31/2013  Patient/family informed of [facility] ownership interest in [facility], as well as of the fact that they are under no obligation to receive care at this facility.  PASARR submitted to EDS on 08/31/2013 PASARR number received from EDS on 08/31/2013  FL2 transmitted to all facilities in geographic area requested by pt/family on  08/31/2013 FL2 transmitted to all facilities within larger geographic area on   Patient informed that his/her managed care company has contracts with or will negotiate with  certain facilities, including the following:   wants [facility] if possible.  [employer] auth requested.     Patient/family informed of bed offers received:  08/31/2013 Patient chooses bed at [facility] Physician recommends and patient chooses bed at    Patient to be transferred to [facility] on  09/02/2013 Patient to be transferred to facility by [facility] with brother  The following physician request were  entered in Epic:   Additional Comments: 09/04/13  Patient d/c'd to [facility] 09/02/13 for short term SNF- [employer] auth in place.  Crawford Givens, LCSWA (coverage) completed d/c arrangments to SNF.    Lorie Phenix. [place], [place]

## 2013-09-01 NOTE — Clinical Social Work Psychosocial (Addendum)
    Clinical Social Work Department BRIEF PSYCHOSOCIAL ASSESSMENT 09/01/2013  Patient:  [name], [name]     Account Number:  [id number]     Admit date:  08/23/2013  Clinical Social Worker:  Iona Coach  Date/Time:  08/31/2013 02:30 PM  Referred by:  Physician  Date Referred:  08/30/2013 Referred for  SNF Placement   Other Referral:   Interview type:  Other - See comment Other interview type:   Patient and brother Richard    PSYCHOSOCIAL DATA Living Status:  ALONE Admitted from facility:   Level of care:   Primary support name:  Cathlean Sauer   [phone] Primary support relationship to patient:  SIBLING Degree of support available:   Very Supportive  Brother  Richard    CURRENT CONCERNS Current Concerns  Post-Acute Placement   Other Concerns:    SOCIAL WORK ASSESSMENT / PLAN 73 year old male referred to [facility] for short term SNF placement. Patient lives alone, still drives and remains fairly independent. Patient has had increased weakness and unsteady gait. Discussed recommendation for short term SNF and patient agrees. Perfers SNF in [place] if possible as one of his sisters lives there.  Bed search process discussed and initated. Bed offers received and patient chose [facility].  [employer] Josem Kaufmann is required and message left for Shay at [facility] re: above.  FL2 on chart for MD's signature.   Assessment/plan status:  Psychosocial Support/Ongoing Assessment of Needs Other assessment/ plan:   Information/referral to community resources:   SNF list provided    PATIENT'S/FAMILY'S RESPONSE TO PLAN OF CARE: Patient is alert and oriented, very pleasant. He was originally against SNF placement but states that he now feels he would benefit from a short stay.  He feels he is weaker than expected.  CSW also spoke with his brother Delfino Lovett and Sister Laverda Sorenson who were supportive of short term rehab stay. CSW will monitor and assist patient with placement.   Lorie Phenix. [place], [place]

## 2013-09-01 NOTE — Progress Notes (Signed)
[facility] PROGRESS NOTE Interim History: 73 year old male patient brought to the ED by EMS. 24 hours prior to presentation the patient had blood work done at his PCP's office which demonstrated marked hyperkalemia and other lab abnormalities.Laboratory data in the ER revealed INR elevated at 4.01. He was subsequently admitted to the ICU. He was given vitamin K to correct his coagulopathy. He was given Kayexalate to treat his hyperkalemia. His abdominal ultrasound was consistent with cirrhosis and ascites. It was suspected he may have been hypervolemic with possible cardiorenal syndrome so he initially was given IV Lasix. Gastroenterology was consulted as well as Nephrology. Renal ultrasound revealed no evidence of obstruction. Patient has not required hemodialysis.  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 - Patient endorses only minimal alcohol intake and hx appears reliable. - Paracentesis done fluid not convincing for SBP. - Ruled out wilson's disease w/ normal ceruloplasmin. - ANA negative - mitochondrial ab normal -alpha-1-antitrypsin level normal. - Ferritin < 300 therefore not hemochromatosis. - Acute viral hepatitis studies negative. - I am suspicious that Amio may be the offending agent . - follow on tele, continue off amio - recovery, if happens, will be slow if this is due to amiodarone. - Physical therapy recommended SNF at discharge. -ascites building up; will attempt paracentesis one more time prior to discharge. -albumin to be given after paracentesis -continue lasix and spironolactone  Acute renal failure / Hyperkalemia / Metabolic acidosis  - Multifactorial: low output congestive heart failure, AKI, & recent blood loss from GI bleed  - BUN was markedly elevated at 149 with a  creatinine of 4.66 at presentation  - Cr continues to slowly improve. - basleine 1.4-1.5. -Cr on 1/29 1.63 (essentially stable)  Mild upper GI hemorrhage due to Acute esophagitis/Gastritis  - Appreciate GI assistance  - No further bleeding since admission  - EGD 1/22 revealed evidence of esophagitis but no varices - biopsy pending including H. pylori specimen  - Continue PPI  Acute blood loss anemia  - Hemoglobin has remained stable  - due to gastritis/ GI bleed (see above).  Hypotension  - Multifactorial due to low output heart failure, plus aggressive diuresis at time of admission and imdur - Was not orthostatic when checked  - BP continue to be less than 100 -continue holding coreg -on lasix and spironolactone -will monitor VS.  Warfarin-induced coagulopathy + cirrhosis related coagulopathy  - Was given vitamin K at admission - INR reversed to 1.47, suspect had underlying coagulopathy due to liver disease  - Will not resume coumadin or any other anticoagulation for now -follow up with cardiology as an outpatient for final decision  COPD  - Wheezing continue resolving -will continue slow tapering prednisone -continue pulmicort -continue abx's - Xopenex PRN  Acute Hypoxemic respiratory failure  - Appeared to be mechanical in nature given abdominal distention for persistent ascites  - Chest x-ray at time of admission only revealed mild diffuse pulmonary vascular congestion without overt pulmonary edema  -repeated CXR w/o acute pulmonary disease -will attempt another paracentesis prior to discharge  Chronic systolic heart failure  - Echocardiogram this admission revealed EF of 35-40% - his EF has actually improved compared with previous. - I would stay away from Aldactone and ACE therapy combo. - Low dose lasix along with aldactone.  -continue holding beta blocker for now. -  will follow cardiology rec's -patient weight is rising and mainly ascites -will due paracentesis  and provide albumin  Atrial fibrillation s/p ablation  - Discontinue amiodarone as above  - Coumadin preadmission not to be resumed for now due to cirrhosis. - no varicies on EGD ? If NOAC's might beneficial at discharge 1-2 weeks from now.  Hx of HTN  -BP still soft but stable  Type II diabetes mellitus with peripheral circulatory disorders, uncontrolled  -CBGs elevated - continue adjusting tx as needed and follow trend    Code Status: full Family Communication: none  Disposition Plan:SNF when stable   Consultants:  Cardiology  PCCM  GI  Procedures:  EGD: grade 1 esophagitis, no varices  ECHO EF 35-40%  Paracentesis 1/23 (6L ascitic fluid removed)   Antibiotics:  None  HPI/Subjective: Feeling better. No fever. Will arrange for paracentesis in am and after procedure will d/c to SNF.  Objective: Filed Vitals:   09/01/13 0621 09/01/13 0755 09/01/13 1304 09/01/13 1327  BP: 101/65  104/59   Pulse: 70 72 60 68  Temp: 97.4 F (36.3 C)  97.2 F (36.2 C)   TempSrc: Oral  Oral   Resp: 18 19 20 18   Height:      Weight:      SpO2: 100% 100% 100% 100%    Intake/Output Summary (Last 24 hours) at 09/01/13 1611 Last data filed at 09/01/13 1305  Gross per 24 hour  Intake    960 ml  Output    900 ml  Net     60 ml   Filed Weights   08/30/13 0612 08/31/13 0604 09/01/13 0336  Weight: 69.4 kg (153 lb) 70.9 kg (156 lb 4.9 oz) 71.124 kg (156 lb 12.8 oz)    Exam:  General: Alert, awake, oriented x3, in no acute distress.  HEENT: No bruits, no goiter.  Heart: Regular rate and rhythm, without murmurs, rubs, gallops.  Lungs: Good air movement bilateral; no frank crackles.  Abdomen: Soft, nontender, positive distensions and positive fluid wave signs on exam (slightly bigger than yesterday)   Data Reviewed: Basic Metabolic Panel:  Recent Labs Lab 08/27/13 0330 08/28/13 0630 08/29/13 0403 08/30/13 0556 09/01/13 0545  NA 139 135* 135* 134* 133*  K 3.7 3.6*  3.7 4.2 4.8  CL 100 96 96 97 97  CO2 23 24 22 22 22   GLUCOSE 160* 260* 313* 376* 280*  BUN 60* 56* 56* 51* 61*  CREATININE 1.88* 1.89* 1.86* 1.61* 1.63*  CALCIUM 8.5 8.3* 7.8* 8.0* 8.8   Liver Function Tests:  Recent Labs Lab 08/26/13 0240 08/27/13 0330 08/28/13 0630 08/29/13 0403 08/30/13 0556  AST 21 44* 37 36 39*  ALT 23 27 30  33 46  ALKPHOS 75 100 111 102 120*  BILITOT 2.4* 2.0* 1.4* 1.1 0.9  PROT 7.7 7.0 7.0 6.4 6.7  ALBUMIN 3.4* 3.0* 2.9* 2.8* 2.8*   No results found for this basename: LIPASE, AMYLASE,  in the last 168 hours  Recent Labs Lab 08/28/13 0630  AMMONIA 10*   CBC:  Recent Labs Lab 08/26/13 0240 08/27/13 0330 08/28/13 0630 08/29/13 0403 08/30/13 0556  WBC 7.1 5.3 4.8 3.9* 4.1  HGB 10.8* 10.8* 10.3* 9.1* 10.1*  HCT 32.7* 32.5* 31.3* 28.4* 31.0*  MCV 91.1 90.3 90.7 91.6 91.4  PLT 160 152 161 148* 163   Cardiac Enzymes: No results found for this basename: CKTOTAL, CKMB, CKMBINDEX, TROPONINI,  in the last 168 hours BNP (last 3 results)  Recent Labs  11/29/12  1245 06/03/13 2108 08/23/13 1534  PROBNP 1278.0* 1390.0* 2953.0*   CBG:  Recent Labs Lab 08/31/13 1142 08/31/13 1632 08/31/13 2143 09/01/13 0642 09/01/13 1107  GLUCAP 233* 172* 235* 225* 153*    Recent Results (from the past 240 hour(s))  MRSA PCR SCREENING     Status: None   Collection Time    08/23/13 10:45 PM      Result Value Range Status   MRSA by PCR NEGATIVE  NEGATIVE Final   Comment:            The GeneXpert MRSA Assay (FDA     approved for NASAL specimens     only), is one component of a     comprehensive MRSA colonization     surveillance program. It is not     intended to diagnose MRSA     infection nor to guide or     monitor treatment for     MRSA infections.     Studies: Dg Chest 2 View  08/31/2013   CLINICAL DATA:  Short of breath.  EXAM: CHEST  2 VIEW  COMPARISON:  08/25/2013  FINDINGS: There are prominent bronchovascular markings. Mild  interstitial thickening is noted most evident in the peripheral lung bases. The lungs are mildly hyperexpanded. There are no focal areas consolidation. No convincing pulmonary edema. There is no pleural effusion or pneumothorax.  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 The cardiac silhouette is mildly enlarged. There are normal mediastinal and hilar contours.  The bony thorax is demineralized but intact.  IMPRESSION: No acute cardiopulmonary disease.   Electronically Signed   By: Lajean Manes M.D.   On: 08/31/2013 16:15    Scheduled Meds: . budesonide (PULMICORT) nebulizer solution  0.25 mg Nebulization BID  . doxycycline  100 mg Oral Q12H  . furosemide  20 mg Oral Daily  . insulin aspart  0-15 Units Subcutaneous TID WC  . insulin aspart  4 Units Subcutaneous TID WC  . insulin detemir  35 Units Subcutaneous QHS  . ipratropium  0.5 mg Nebulization TID  . levalbuterol  0.63 mg Nebulization TID  . pantoprazole  40 mg Oral BID  . predniSONE  40 mg Oral Q breakfast  . spironolactone  25 mg Oral Daily   Continuous Infusions:    Corbin Hott  [facility] Pager [phone]. If 8PM-8AM, please contact night-coverage at [email or web address], password [facility] 09/01/2013, 4:11 PM  LOS: 9 days

## 2013-09-01 NOTE — Progress Notes (Signed)
Subjective: Feels much better. Breathing improved.   Objective: Vital signs in last 24 hours: Temp:  [97.3 F (36.3 C)-97.5 F (36.4 C)] 97.4 F (36.3 C) (01/29 0621) Pulse Rate:  [70-84] 72 (01/29 0755) Resp:  [18-20] 19 (01/29 0755) BP: (94-105)/(47-65) 101/65 mmHg (01/29 0621) SpO2:  [99 %-100 %] 100 % (01/29 0755) Weight:  [156 lb 12.8 oz (71.124 kg)] 156 lb 12.8 oz (71.124 kg) (01/29 0336) Last BM Date: 08/31/13  Intake/Output from previous day: 01/28 0701 - 01/29 0700 In: 840 [P.O.:840] Out: 1400 [Urine:1400] Intake/Output this shift: Total I/O In: 120 [P.O.:120] Out: -   Medications Current Facility-Administered Medications  Medication Dose Route Frequency Provider Last Rate Last Dose  . budesonide (PULMICORT) nebulizer solution 0.25 mg  0.25 mg Nebulization BID Barton Dubois, MD   0.25 mg at 09/01/13 0751  . doxycycline (VIBRA-TABS) tablet 100 mg  100 mg Oral Q12H Barton Dubois, MD   100 mg at 08/31/13 2158  . furosemide (LASIX) tablet 20 mg  20 mg Oral Daily Cherene Altes, MD   20 mg at 08/31/13 1055  . insulin aspart (novoLOG) injection 0-15 Units  0-15 Units Subcutaneous TID WC Cherene Altes, MD   5 Units at 09/01/13 [phone]  . insulin aspart (novoLOG) injection 4 Units  4 Units Subcutaneous TID WC Cherene Altes, MD   4 Units at 08/31/13 1724  . insulin detemir (LEVEMIR) injection 35 Units  35 Units Subcutaneous QHS Charlynne Cousins, MD   35 Units at 08/31/13 2158  . ipratropium (ATROVENT) nebulizer solution 0.5 mg  0.5 mg Nebulization TID Charlynne Cousins, MD   0.5 mg at 09/01/13 0750  . levalbuterol (XOPENEX) nebulizer solution 0.63 mg  0.63 mg Nebulization Q3H PRN Cherene Altes, MD      . levalbuterol Penne Lash) nebulizer solution 0.63 mg  0.63 mg Nebulization TID Charlynne Cousins, MD   0.63 mg at 09/01/13 0750  . ondansetron (ZOFRAN) injection 4 mg  4 mg Intravenous Q6H PRN Doree Fudge, MD      . oxyCODONE (Oxy  IR/ROXICODONE) immediate release tablet 5 mg  5 mg Oral Q6H PRN Joni Reining, DO      . pantoprazole (PROTONIX) EC tablet 40 mg  40 mg Oral BID Harolyn Rutherford, RPH   40 mg at 08/31/13 2158  . predniSONE (DELTASONE) tablet 40 mg  40 mg Oral Q breakfast Barton Dubois, MD      . spironolactone (ALDACTONE) tablet 25 mg  25 mg Oral Daily Cherene Altes, MD   25 mg at 08/31/13 1054    PE: General appearance: alert, cooperative and no distress Lungs: bibasilar crackles Heart: regular rate and rhythm Abdomen: ascietes Extremities: trace LEE Pulses: 2+ and symmetric Skin: warm and dry Neurologic: Grossly normal  Lab Results:   Recent Labs  08/30/13 0556  WBC 4.1  HGB 10.1*  HCT 31.0*  PLT 163   BMET  Recent Labs  08/30/13 0556 09/01/13 0545  NA 134* 133*  K 4.2 4.8  CL 97 97  CO2 22 22  GLUCOSE 376* 280*  BUN 51* 61*  CREATININE 1.61* 1.63*  CALCIUM 8.0* 8.8   BNP (last 3 results)  Recent Labs  11/29/12 0655 06/03/13 2108 08/23/13 1534  PROBNP 1278.0* 1390.0* 2953.0*   Filed Weights   08/30/13 0612 08/31/13 0604 09/01/13 0336  Weight: 153 lb (69.4 kg) 156 lb 4.9 oz (70.9 kg) 156 lb 12.8 oz (71.124 kg)   Telemetry:  I have reviewed telemetry today September 01, 2013. There is sinus rhythm.  Assessment/Plan    Upper GI hemorrhage- adm 08/23/13- endo showed gastritis    CAD- CABG '96, inferior scar on Myoview 2011      Coronary disease is stable. No further workup at this time.    PAF-  in NSR      Amiodarone has been stopped. For now he continues in sinus rhythm.         BENIGN PROSTATIC HYPERTROPHY   HTN (hypertension)   Type II or unspecified type diabetes mellitus with peripheral circulatory disorders, uncontrolled(250.72)   COPD (chronic obstructive pulmonary disease)    Acute on chronic systolic heart failure      At this point it appears that his overall volume status is stable. He still has rhonchi in the question of rales on physical exam.  However I would not plan to push his diuresis any further at this time.    Warfarin-induced coagulopathy- Coumadin reversed and ultimatly discontinued      Coumadin has been stopped. Question has been raised as to whether or not a novel anticoagulant should be tried at this time. I would suggest not using any anticoagulants at this time. He has a multitude of reasons to bleed. I feel it is most prudent to not anticoagulate.    Cirrhosis of liver with ascites- s/p paracentesis    The question has been raised as to whether or not his liver disease could be related to his amiodarone. Amiodarone has been stopped.      Gastritis   Acute-on-chronic renal failure- improving   Cardiomyopathy, ischemic- EF 35-40% by echo 08/24/13   Sleep apnea- on O2 at night  The patient appears to be stable today. I do agree that strong consideration can now be given to discharging him in the near future.    Brittainy M. Ladoris Gene 09/01/2013 9:39 AM Patient seen and examined. I agree with the assessment and plan as detailed above. See also my additional thoughts below.   I have personally outlined the assessment and plan in the note above. No further changes in his cardiac meds is recommended.  Dola Argyle, MD, [facility] 09/01/2013 1:08 PM

## 2013-09-01 NOTE — Progress Notes (Signed)
Bed offer in place for [place] has been received for 7 days.  Facility will need to request further days if needed.  Per MD- patient required cardiology input prior to d/c- hopefully will be stable for d/c tomorrow. Notified patient and his brother Delfino Lovett. Facility is aware.  Brother will transport to facility via car.  Lorie Phenix. [place], [place]

## 2013-09-02 ENCOUNTER — Inpatient Hospital Stay (HOSPITAL_COMMUNITY): Payer: Medicare HMO

## 2013-09-02 ENCOUNTER — Telehealth: Payer: Self-pay

## 2013-09-02 DIAGNOSIS — I5023 Acute on chronic systolic (congestive) heart failure: Secondary | ICD-10-CM | POA: Diagnosis not present

## 2013-09-02 DIAGNOSIS — N179 Acute kidney failure, unspecified: Secondary | ICD-10-CM | POA: Diagnosis not present

## 2013-09-02 DIAGNOSIS — I4891 Unspecified atrial fibrillation: Secondary | ICD-10-CM | POA: Diagnosis not present

## 2013-09-02 DIAGNOSIS — E875 Hyperkalemia: Secondary | ICD-10-CM | POA: Diagnosis not present

## 2013-09-02 LAB — GLUCOSE, CAPILLARY
GLUCOSE-CAPILLARY: 217 mg/dL — AB (ref 70–99)
GLUCOSE-CAPILLARY: 197 mg/dL — AB (ref 70–99)
GLUCOSE-CAPILLARY: 140 mg/dL — AB (ref 70–99)

## 2013-09-02 MED ORDER — FUROSEMIDE 20 MG PO TABS: 20.0000 mg | ORAL_TABLET | Freq: Every day | ORAL | Status: DC

## 2013-09-02 MED ORDER — DOXYCYCLINE HYCLATE 100 MG PO TABS: 100.0000 mg | ORAL_TABLET | Freq: Two times a day (BID) | ORAL | Status: AC

## 2013-09-02 MED ORDER — LEVALBUTEROL HCL 0.63 MG/3ML IN NEBU: 0.6300 mg | INHALATION_SOLUTION | Freq: Three times a day (TID) | RESPIRATORY_TRACT | Status: DC

## 2013-09-02 MED ORDER — OXYCODONE HCL 5 MG PO TABS: 5.0000 mg | ORAL_TABLET | Freq: Four times a day (QID) | ORAL | Status: DC | PRN

## 2013-09-02 MED ORDER — BUDESONIDE 0.25 MG/2ML IN SUSP: 0.2500 mg | Freq: Two times a day (BID) | RESPIRATORY_TRACT | Status: DC

## 2013-09-02 MED ORDER — PREDNISONE 20 MG PO TABS: ORAL_TABLET | ORAL | Status: DC

## 2013-09-02 MED ORDER — INSULIN DETEMIR 100 UNIT/ML ~~LOC~~ SOLN
25.0000 [IU] | Freq: Every day | SUBCUTANEOUS | Status: DC
Start: 2013-09-02 — End: 2013-10-03

## 2013-09-02 MED ORDER — PANTOPRAZOLE SODIUM 40 MG PO TBEC: 40.0000 mg | DELAYED_RELEASE_TABLET | Freq: Two times a day (BID) | ORAL | Status: DC

## 2013-09-02 NOTE — Progress Notes (Signed)
Patient Name: [name] Date of Encounter: 09/02/2013  Principal Problem:   Upper GI hemorrhage- adm 08/23/13- endo showed gastritis Active Problems:   HYPERLIPIDEMIA   CAD- CABG '96, inferior scar on Myoview 2011   PAF- he had been in NSR on Amiodarone   BENIGN PROSTATIC HYPERTROPHY   HTN (hypertension)   Type II or unspecified type diabetes mellitus with peripheral circulatory disorders, uncontrolled(250.72)   COPD (chronic obstructive pulmonary disease)   Acute on chronic systolic heart failure   Bladder tumor- recently diagnosed, he will need cystoscopy   Hyperkalemia on admission   Metabolic acidosis   Warfarin-induced coagulopathy- Coumadin reversed and ultimatly discontinued   Cirrhosis of liver with ascites- s/p paracentesis   Gastritis   Acute-on-chronic renal failure- improving   Cardiomyopathy, ischemic- EF 35-40% by echo 08/24/13   Sleep apnea- on O2 at night    SUBJECTIVE: No chest pain, breathing OK, ready to go to rehab  OBJECTIVE Filed Vitals:   09/01/13 1934 09/01/13 1942 09/02/13 0459 09/02/13 0752  BP: 100/46  100/61   Pulse: 70  72 71  Temp: 97.5 F (36.4 C)  97.3 F (36.3 C)   TempSrc: Oral  Oral   Resp: 18  18 18   Height:      Weight:   156 lb 14.4 oz (71.169 kg)   SpO2: 99% 98% 99% 98%    Intake/Output Summary (Last 24 hours) at 09/02/13 0759 Last data filed at 09/02/13 0403  Gross per 24 hour  Intake   1200 ml  Output   1650 ml  Net   -450 ml   Filed Weights   08/31/13 0604 09/01/13 0336 09/02/13 0459  Weight: 156 lb 4.9 oz (70.9 kg) 156 lb 12.8 oz (71.124 kg) 156 lb 14.4 oz (71.169 kg)    PHYSICAL EXAM General: Well developed, well nourished, male in no acute distress. Head: Normocephalic, atraumatic.  Neck: Supple without bruits, JVD minimal. Lungs:  Resp regular and unlabored, coarse rales, slight wheeze (getting neb). Heart: RRR, S1, S2, no S3, S4, or murmur; no rub. Abdomen: Soft, non-tender, non-distended, BS + x 4.   Extremities: No clubbing, cyanosis, no edema.  Neuro: Alert and oriented X 3. Moves all extremities spontaneously. Psych: Normal affect.  LABS: Basic Metabolic Panel: Recent Labs  09/01/13 0545  NA 133*  K 4.8  CL 97  CO2 22  GLUCOSE 280*  BUN 61*  CREATININE 1.63*  CALCIUM 8.8   BNP: Pro B Natriuretic peptide (BNP)  Date/Time Value Range Status  08/23/2013  3:34 PM 2953.0* 0 - 125 pg/mL Final  06/03/2013  9:08 PM 1390.0* 0 - 125 pg/mL Final   TELE:   SR, PVCs  Radiology/Studies: Dg Chest 2 View 08/31/2013   CLINICAL DATA:  Short of breath.  EXAM: CHEST  2 VIEW  COMPARISON:  08/25/2013  FINDINGS: There are prominent bronchovascular markings. Mild interstitial thickening is noted most evident in the peripheral lung bases. The lungs are mildly hyperexpanded. There are no focal areas consolidation. No convincing pulmonary edema. There is no pleural effusion or pneumothorax.  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 The cardiac silhouette is mildly enlarged. There are normal mediastinal and hilar contours.  The bony thorax is demineralized but intact.  IMPRESSION: No acute cardiopulmonary disease.   Electronically Signed   By: Lajean Manes M.D.   On: 08/31/2013 16:15     Current Medications:  . albumin human  25 g Intravenous Q6H  . budesonide (PULMICORT)  nebulizer solution  0.25 mg Nebulization BID  . doxycycline  100 mg Oral Q12H  . furosemide  20 mg Oral Daily  . insulin aspart  0-15 Units Subcutaneous TID WC  . insulin aspart  4 Units Subcutaneous TID WC  . insulin detemir  35 Units Subcutaneous QHS  . ipratropium  0.5 mg Nebulization TID  . levalbuterol  0.63 mg Nebulization TID  . pantoprazole  40 mg Oral BID  . predniSONE  40 mg Oral Q breakfast  . 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 No further workup at this time.   PAF- in NSR  Amiodarone has been stopped. For now  he continues in sinus rhythm with PVCs.  AoC Systolic CHF: Wt 264>>158>>309>>407. Sats are 98% or more on room air. Lasix 20 mg and Spiro 25 mg daily, RF stable/improved, follow. Continue daily weights. BMET in 1 week. F/U with Dr. Rockey Situ.  LVD/ICM - EF 35-40%, no BB due to hypotension, no ACE/ARB due to hypotension and CKD. Tolerating diuretics, follow up as OP. Appt made.  Otherwise, per primary MD. OK to go to rehab today. Principal Problem:   Upper GI hemorrhage- adm 08/23/13- endo showed gastritis Active Problems:   HYPERLIPIDEMIA   CAD- CABG '96, inferior scar on Myoview 2011   PAF- he had been in NSR on Amiodarone   BENIGN PROSTATIC HYPERTROPHY   HTN (hypertension)   Type II or unspecified type diabetes mellitus with peripheral circulatory disorders, uncontrolled(250.72)   COPD (chronic obstructive pulmonary disease)   Acute on chronic systolic heart failure   Bladder tumor- recently diagnosed, he will need cystoscopy   Hyperkalemia on admission   Metabolic acidosis   Warfarin-induced coagulopathy- Coumadin reversed and ultimatly discontinued   Cirrhosis of liver with ascites- s/p paracentesis   Gastritis   Acute-on-chronic renal failure- improving   Cardiomyopathy, ischemic- EF 35-40% by echo 08/24/13   Sleep apnea- on O2 at night   Signed, Rosaria Ferries , PA-C 7:59 AM 09/02/2013 Patient seen and examined. I agree with the assessment and plan as detailed above. See also my additional thoughts below.   Patient is to have paracentesis today. I am hopeful that his ascites will stabilize. Plans will be made for him to be seen for heart failure followup early her post hospitalization. It is possible that the patient may need to be seen by GI and set up for a recurring paracentesis protocol.  Dola Argyle, MD, [facility] 09/02/2013 10:23 AM

## 2013-09-02 NOTE — Progress Notes (Signed)
Physical Therapy Treatment Patient Details Name: [name] MRN: [id number] DOB: [birth year] Today's Date: 09/02/2013 Time: 7829-5621 PT Time Calculation (min): 30 min  PT Assessment / Plan / Recommendation  History of Present Illness pt reports he still gets winded when he walks   PT Comments   Pt with decreased activity tolerance this session vs last session, unable to gait as far, requires frequent rests during gait and therex.  Follow Up Recommendations  Supervision/Assistance - 24 hour;SNF     Does the patient have the potential to tolerate intense rehabilitation     Barriers to Discharge        Equipment Recommendations   (rollator)    Recommendations for Other Services    Frequency Min 3X/week   Progress towards PT Goals Progress towards PT goals: Progressing toward goals  Plan Current plan remains appropriate    Precautions / Restrictions Precautions Precautions: Fall Restrictions Weight Bearing Restrictions: No   Pertinent Vitals/Pain No c/o pain.    Mobility  Transfers Sit to Stand: Supervision General transfer comment: increased time Ambulation/Gait Ambulation/Gait assistance: Supervision Ambulation Distance (Feet): 150 Feet Assistive device: None Gait velocity interpretation: <1.8 ft/sec, indicative of risk for recurrent falls General Gait Details: pt requires 4x  standing rests during gait today.  Asked to hold to wall for support due to fatigue.  He agrees he will benefit from 4 wheeled walker for energy conservation     Exercises General Exercises - Upper Extremity Shoulder Flexion: AROM;Both;10 reps;Seated General Exercises - Lower Extremity Ankle Circles/Pumps: AROM;Both;15 reps;Seated Long Arc Quad: AROM;Both;10 reps;Seated Hip Flexion/Marching: AROM;Both;10 reps;Seated   PT Diagnosis:    PT Problem List:   PT Treatment Interventions:     PT Goals (current goals can now be found in the care plan section)    Visit Information  Last PT  Received On: 09/02/13 Assistance Needed: +1 History of Present Illness: pt reports he still gets winded when he walks    Subjective Data      Cognition  Cognition Arousal/Alertness: Awake/alert Behavior During Therapy: WFL for tasks assessed/performed Overall Cognitive Status: Within Functional Limits for tasks assessed    Balance     End of Session PT - End of Session Activity Tolerance: Patient limited by fatigue Patient left: in bed;with call bell/phone within reach Nurse Communication: Mobility status   GP     Savana Spina 09/02/2013, 10:34 AM

## 2013-09-02 NOTE — Discharge Summary (Signed)
Physician Discharge Summary  [name] Ryle [id number] DOB: 1940/12/10 DOA: 08/23/2013  PCP: [name], MD  Admit date: 08/23/2013 Discharge date: 09/02/2013  Time spent: >30 minutes  Recommendations for Outpatient Follow-up:  1. Close follow up to weight 2. Low sodium diet 3. Fluid intake limited to 1.2L in 24 hours 4. BMET to follow electrolytes and renal function 5. Follow up with cardiology as instructed   Discharge Diagnoses:  Principal Problem:   Upper GI hemorrhage- adm 08/23/13- endo showed gastritis Active Problems:   HYPERLIPIDEMIA   CAD- CABG '96, inferior scar on Myoview 2011   PAF- he had been in NSR on Amiodarone   BENIGN PROSTATIC HYPERTROPHY   HTN (hypertension)   Type II or unspecified type diabetes mellitus with peripheral circulatory disorders, uncontrolled(250.72)   COPD (chronic obstructive pulmonary disease)   Acute on chronic systolic heart failure   Bladder tumor- recently diagnosed, he will need cystoscopy   Hyperkalemia on admission   Metabolic acidosis   Warfarin-induced coagulopathy- Coumadin reversed and ultimatly discontinued   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 No CP or SOB. Will discharge to SNF for physical rehab and will follow with cardiology in 7-10 days  Diet recommendation: low sodium diet and low carb diet. 1.2L fluid in 24 hours.  Filed Weights   08/31/13 0604 09/01/13 0336 09/02/13 0459  Weight: 70.9 kg (156 lb 4.9 oz) 71.124 kg (156 lb 12.8 oz) 71.169 kg (156 lb 14.4 oz)    History of present illness:  73 y/o from [place], followed by Dr Silvio Pate and Dr Rockey Situ. He has a history of CAD, s/p CABG in '96 with Myoview 2011. He has had a history of PAFand has had a remote RFA. He has been holding NSR as an OP on Amiodarone. He has  also been on chronic Coumadin. He has a cardiomyopathy with an EF as low as 25-30% Sept 2013, though echo this admission actually showed his EF to be improved to 35-40%. He has chronic CHF and his diuretics are adjusted by Dr Silvio Pate and Dr Rockey Situ as an OP. It appears his gaol wgt is 160.  He was admitted in Oct 2014 with hematuria and work up revealed bladder cancer. He was to have a cystoscopy but he apparently developed cellulitis as an OP and required antibiotics. He had reported anorexia to his primary MD and labs were drawn 08/23/13 showing acute on chronic renal failure with a SCr of 5.22, baseline 1.4, and a K+ of 6.3. He was sent to the ER by EMS. He was also complaining of BRB per rectum in the setting of an INR of 4.0. His Hgb dropped to 9.5. He has been followed by the renal and GI service. His SCr has improved. Endoscopy revealed gastritis and esophagitis, no varices. Abdominal US done revealed cirrhosis with ascites, 6 L were drawn off on 08/26/13. He is now off Coumadin.  He now complains of DOE. His wgt is down to 147. His SCr has improved to 1.8. His wgt is down to 147.    Hospital Course:  Cryptogenic Cirrhosis of liver with ascites-?/presumed due to Amiodarone  - Anti-smooth muscle ab neg/WNL  - Relatively new finding, CT scan 2001 - [place] of liver March 2014 noted enlarged liver but no parenchymal findings.  - Patient endorses only  minimal alcohol intake and hx appears reliable.  - Paracentesis done fluid not convincing for SBP.  - Ruled out wilson's disease w/ normal ceruloplasmin.  - ANA negative - mitochondrial ab normal -alpha-1-antitrypsin level normal.  - Ferritin < 300 therefore not hemochromatosis.  - Acute viral hepatitis studies negative.  - I am suspicious that Amio may be the offending agent .  - follow on tele, continue off amio - recovery, if happens, will be slow if this is due to amiodarone.  - Physical therapy recommended SNF at discharge.  -ascites improved after  paracentesis on 1/30 4L removed.  -continue lasix and spironolactone   Acute renal failure / Hyperkalemia / Metabolic acidosis  - Multifactorial: low output congestive heart failure, AKI, & recent blood loss from GI bleed  - BUN was markedly elevated at 149 with a creatinine of 4.66 at presentation  - Cr continues to slowly improve. - basleine 1.4-1.5.  -Cr on 1/29 1.63 (essentially stable)   Mild upper GI hemorrhage due to Acute esophagitis/Gastritis  - Appreciate GI assistance  - No further bleeding since admission  - EGD 1/22 revealed evidence of esophagitis but no varices - biopsy pending including H. pylori specimen  - Continue PPI   Acute blood loss anemia  - Hemoglobin has remained stable  - due to gastritis/ GI bleed (see above).   Hypotension  - Multifactorial due to low output heart failure, plus aggressive diuresis at time of admission and imdur  - Was not orthostatic when checked  - BP continue to be soft, but stable  -continue holding imdur and b-blocker -on lasix and spironolactone  -follow with cardiology in 1 week after discharge  Warfarin-induced coagulopathy + cirrhosis related coagulopathy  - Was given vitamin K at admission - INR reversed to 1.47, suspect had underlying coagulopathy due to liver disease  - Will not resume coumadin or any other anticoagulation for now  -follow up with cardiology as an outpatient for final decision   COPD  -Wheezing continue resolving  -will continue slow tapering prednisone  -continue pulmicort  -continue abx's for 5 more days - Xopenex PRN   Acute Hypoxemic respiratory failure  - Appeared to be mechanical in nature given abdominal distention for persistent ascites  - Chest x-ray at time of admission only revealed mild diffuse pulmonary vascular congestion without overt pulmonary edema  -repeated CXR w/o acute pulmonary disease  -significant better -status post paracentesis (1/23--1/30) 10L total removed  Chronic  systolic heart failure  - Echocardiogram this admission revealed EF of 35-40% - his EF has actually improved compared with previous.  - Low dose lasix along with aldactone.  -continue holding beta blocker for now.  - will follow with cardiology in 7-10 days after discharge  -close follow up to weight, intake and output -no ACE/ARB due to renal failure  Atrial fibrillation s/p ablation  - Discontinue amiodarone as above  - Coumadin preadmission not to be resumed for now due to cirrhosis.  - no varicies on EGD ? If NOAC's might beneficial at discharge, will follow with cardiology for final decision  Hx of HTN  -BP still soft but stable -close follow up to vital signs   Type II diabetes mellitus with peripheral circulatory disorders, uncontrolled  -CBGs elevated - continue levemir and low carb diet   Procedures: EGD: grade 1 esophagitis, no varices  ECHO EF 35-40% Paracentesis  1/23 and 1/30 (total of 10L ascitic fluid removed)    Consultations:  Cardiology  PCCM  GI  Renal service  Discharge Exam: Filed Vitals:   09/02/13 1337  BP: 103/49  Pulse: 72  Temp: 97.4 F (36.3 C)  Resp: 20    General: Alert, awake, oriented x3, in no acute distress.  HEENT: No bruits, no goiter.  Heart: Regular rate and rhythm, without murmurs, rubs, gallops.  Lungs: Good air movement bilateral; no frank crackles.  Abdomen: Soft, nontender, less distended after 4L removed with paracentesis on 1/30   Discharge Instructions  Discharge Orders   Future Appointments Provider Department Dept Phone   09/12/2013 4:30 PM Venia Carbon, MD [facility] [phone]   09/13/2013 2:00 PM Minna Merritts, MD [facility] [phone]   10/04/2013 11:00 AM Lafayette Dragon, MD [facility] [phone]   Future Orders Complete By Expires   Diet - low sodium heart healthy  As directed    Discharge instructions  As directed    Comments:      Follow low sodium diet (< 2 grams daily) Daily weight BMET in 5 days to follow renal function and electrolytes Arrange follow up with cardiologist in 10 days       Medication List    STOP taking these medications       acetaminophen 500 MG tablet  Commonly known as:  TYLENOL     albuterol (5 MG/ML) 0.5% nebulizer solution  Commonly known as:  PROVENTIL     amiodarone 200 MG tablet  Commonly known as:  PACERONE     atorvastatin 20 MG tablet  Commonly known as:  LIPITOR     carvedilol 6.25 MG tablet  Commonly known as:  COREG     docusate sodium 100 MG capsule  Commonly known as:  COLACE     isosorbide mononitrate 30 MG 24 hr tablet  Commonly known as:  IMDUR     LORazepam 0.5 MG tablet  Commonly known as:  ATIVAN     metolazone 2.5 MG tablet  Commonly known as:  ZAROXOLYN     promethazine 25 MG tablet  Commonly known as:  PHENERGAN     torsemide 20 MG tablet  Commonly known as:  DEMADEX     Vitamin D 1000 UNITS capsule     warfarin 5 MG tablet  Commonly known as:  COUMADIN      TAKE these medications       budesonide 0.25 MG/2ML nebulizer solution  Commonly known as:  PULMICORT  Take 2 mLs (0.25 mg total) by nebulization 2 (two) times daily.     doxycycline 100 MG tablet  Commonly known as:  VIBRA-TABS  Take 1 tablet (100 mg total) by mouth every 12 (twelve) hours.     furosemide 20 MG tablet  Commonly known as:  LASIX  Take 1 tablet (20 mg total) by mouth daily.     insulin detemir 100 UNIT/ML injection  Commonly known as:  LEVEMIR  Inject 0.25 mLs (25 Units total) into the skin at bedtime.     levalbuterol 0.63 MG/3ML nebulizer solution  Commonly known as:  XOPENEX  Take 3 mLs (0.63 mg total) by nebulization 3 (three) times daily.     multivitamin with minerals Tabs tablet  Take 1 tablet by mouth daily.     oxyCODONE 5 MG immediate release tablet  Commonly known as:  Oxy IR/ROXICODONE  Take 1 tablet (5 mg total) by mouth every 6 (six)  hours as needed for severe pain.     pantoprazole 40 MG tablet  Commonly known as:  PROTONIX  Take 1 tablet (40 mg total) by mouth 2 (two) times daily.     predniSONE 20 MG tablet  Commonly known as:  DELTASONE  2 tablets by mouth X 1 day, then 1 tablet by mouth X 3 days; then 1/2 tablet X 3 days and stop prednisone     spironolactone 25 MG tablet  Commonly known as:  ALDACTONE  Take 100 mg by mouth daily.       Allergies  Allergen Reactions  . Latex Other (See Comments)    Very thin skin, pulls off as removing bandaids        Follow-up Information   Follow up with Delfin Edis, MD On 10/04/2013. (11 AM to follow up liver disease. )    Specialty:  Gastroenterology   Contact information:   [address] [place] [address] [phone]       Follow up with Ida Rogue, MD On 09/13/2013. (at 2:00 pm)    Specialty:  Cardiology   Contact information:   [place] [address] [phone]       Follow up with [name], MD. Schedule an appointment as soon as possible for a visit in 2 weeks.   Specialties:  Internal Medicine, Pediatrics   Contact information:   [place] [address] [phone]        The results of significant diagnostics from this hospitalization (including imaging, microbiology, ancillary and laboratory) are listed below for reference.    Significant Diagnostic Studies: Dg Chest 2 View  08/31/2013   CLINICAL DATA:  Short of breath.  EXAM: CHEST  2 VIEW  COMPARISON:  08/25/2013  FINDINGS: There are prominent bronchovascular markings. Mild interstitial thickening is noted most evident in the peripheral lung bases. The lungs are mildly hyperexpanded. There are no focal areas consolidation. No convincing pulmonary edema. There is no pleural effusion or pneumothorax.  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 The cardiac silhouette is mildly enlarged. There are normal  mediastinal and hilar contours.  The bony thorax is demineralized but intact.  IMPRESSION: No acute cardiopulmonary disease.   Electronically Signed   By: Lajean Manes M.D.   On: 08/31/2013 16:15   US Abdomen Complete  08/24/2013   CLINICAL DATA:  Hepatomegaly.  EXAM: ULTRASOUND ABDOMEN COMPLETE  COMPARISON:  CT scan of the abdomen dated 06/09/2013  FINDINGS: Gallbladder:  There is sludge in the gallbladder. Gallbladder wall is normal. Negative sonographic Murphy's sign.  Common bile duct:  Diameter: 5.3 mm, normal.  Liver:  Diffuse hepatomegaly with increased echogenicity of the liver parenchyma, nodularity of the liver contour and increased echogenicity of periportal structures. No focal lesions.  IVC:  Normal.  Pancreas:  The head and body of the pancreas are normal. The tail of the pancreas is obscured by bowel.  Spleen:  Normal.  9.9 cm in length.  Right Kidney:  Length: 12.8 cm. Small stone in the upper pole. Increased echogenicity renal parenchyma consistent with renal medical disease.  Left Kidney:  Length: 14.4 cm. Increased echogenicity of the renal parenchyma consistent with renal medical disease.  Abdominal aorta:  Normal.  2.1 cm maximum diameter.  Other findings:  Diffuse moderate ascites.  IMPRESSION: 1. Hepatomegaly with findings suggestive of cirrhosis. 2. Moderate abdominal ascites. 3. Increased echogenicity of the renal parenchyma consistent with renal medical disease.   Electronically Signed   By: [name] M.D.   On:  08/24/2013 14:30   US Renal  08/23/2013   CLINICAL DATA:  Acute renal failure.  Evaluate for hydronephrosis.  EXAM: RENAL/URINARY TRACT ULTRASOUND COMPLETE  COMPARISON:  06/09/2013  FINDINGS: Right Kidney:  Length: 12.9 cm. 4 mm echogenic structure in the right kidney upper pole is suggestive for a stone. No evidence for hydronephrosis.  Left Kidney:  Length: 13.4 cm. Echogenicity within normal limits. No mass or hydronephrosis visualized.  Bladder:  There is fluid in the  urinary bladder. Urinary bladder wall appears to be thickened and similar to the prior CT.  Other findings:  Large amount of ascites throughout the abdomen.  IMPRESSION: Negative for hydronephrosis.  Large amount of ascites.  Right kidney stone.  Bladder wall thickening.   Electronically Signed   By: [name] M.D.   On: 08/23/2013 21:42   US Paracentesis  09/02/2013   CLINICAL DATA:  Recurrent ascites, request for therapeutic paracentesis.  EXAM: ULTRASOUND GUIDED PARACENTESIS  TECHNIQUE: Survey ultrasound of the abdomen was performed and an appropriate skin entry site in the RLQ abdomen was selected. Skin site was marked, prepped with Betadine, and draped in usual sterile fashion, and infiltrated locally with 1% lidocaine. A 5 French multisidehole Yueh sheath needle was advanced into the peritoneal space until fluid could be aspirated. The sheath was advanced and the needle removed. 4.1 liters of amber colored ascites were aspirated. No immediate complication.  IMPRESSION: Technically successful ultrasound guided paracentesis, removing 4.1 liters of ascites.  Read By:  Tsosie Billing PA-C   Electronically Signed   By: Aletta Edouard M.D.   On: 09/02/2013 12:19   US Paracentesis  08/26/2013   CLINICAL DATA:  Ascites, request for diagnostic and therapeutic paracentesis.  EXAM: ULTRASOUND GUIDED PARACENTESIS  TECHNIQUE: Survey ultrasound of the abdomen was performed and an appropriate skin entry site in the RLQ abdomen was selected. Skin site was marked, prepped with Betadine, and draped in usual sterile fashion, and infiltrated locally with 1% lidocaine. A 5 French multisidehole Yueh sheath needle was advanced into the peritoneal space until fluid could be aspirated. The sheath was advanced and the needle removed. 6 liters of cloudy yellow/amber ascites were aspirated. No immediate complication. Fluid was sent for additional testing.  IMPRESSION: Technically successful ultrasound guided paracentesis, removing  6 liters of ascites.  Read By:  Tsosie Billing PA-C   Electronically Signed   By: Jacqulynn Cadet M.D.   On: 08/26/2013 14:10   Dg Chest Port 1 View  08/25/2013   CLINICAL DATA:  Dyspnea and hypoxia and hypotension  EXAM: PORTABLE CHEST - 1 VIEW  COMPARISON:  Portable chest x-ray of August 23, 2013  FINDINGS: The lungs are reasonably well inflated. The interstitial markings have increased in conspicuity, bilaterally. The cardiopericardial silhouette is mildly enlarged. The pulmonary vascularity is less distinct and appears mildly engorged. There is no pleural effusion. The patient has undergone previous CABG.  IMPRESSION: The findings are consistent with congestive heart failure with pulmonary interstitial edema. There is no pleural effusion or alveolar infiltrate.   Electronically Signed   By: David  Martinique   On: 08/25/2013 16:10   Dg Chest Port 1 View  08/23/2013   CLINICAL DATA:  Shortness of breath  EXAM: PORTABLE CHEST - 1 VIEW  COMPARISON:  Prior radiograph from 07/29/2013  FINDINGS: Median sternotomy wires with underlying CABG markers and surgical clips again seen. Cardiomegaly is stable.  Lungs are normally inflated. No focal infiltrate to suggest an acute infectious pneumonitis identified. Overall pulmonary vascularity  is mildly increased without overt pulmonary edema. Minimal blunting of the right costophrenic angle is suggestive of a small right pleural effusion. No pneumothorax.  Osseous structures are unchanged.  IMPRESSION: 1. Stable cardiomegaly with mild diffuse pulmonary vascular congestion without overt pulmonary edema. No focal infiltrates identified. 2. Small right pleural effusion.   Electronically Signed   By: [name] M.D.   On: 08/23/2013 16:12    Microbiology: Recent Results (from the past 240 hour(s))  MRSA PCR SCREENING     Status: None   Collection Time    08/23/13 10:45 PM      Result Value Range Status   MRSA by PCR NEGATIVE  NEGATIVE Final   Comment:             The GeneXpert MRSA Assay (FDA     approved for NASAL specimens     only), is one component of a     comprehensive MRSA colonization     surveillance program. It is not     intended to diagnose MRSA     infection nor to guide or     monitor treatment for     MRSA infections.     Labs: Basic Metabolic Panel:  Recent Labs Lab 08/27/13 0330 08/28/13 0630 08/29/13 0403 08/30/13 0556 09/01/13 0545  NA 139 135* 135* 134* 133*  K 3.7 3.6* 3.7 4.2 4.8  CL 100 96 96 97 97  CO2 23 24 22 22 22   GLUCOSE 160* 260* 313* 376* 280*  BUN 60* 56* 56* 51* 61*  CREATININE 1.88* 1.89* 1.86* 1.61* 1.63*  CALCIUM 8.5 8.3* 7.8* 8.0* 8.8   Liver Function Tests:  Recent Labs Lab 08/27/13 0330 08/28/13 0630 08/29/13 0403 08/30/13 0556  AST 44* 37 36 39*  ALT 27 30 33 46  ALKPHOS 100 111 102 120*  BILITOT 2.0* 1.4* 1.1 0.9  PROT 7.0 7.0 6.4 6.7  ALBUMIN 3.0* 2.9* 2.8* 2.8*    Recent Labs Lab 08/28/13 0630  AMMONIA 10*   CBC:  Recent Labs Lab 08/27/13 0330 08/28/13 0630 08/29/13 0403 08/30/13 0556  WBC 5.3 4.8 3.9* 4.1  HGB 10.8* 10.3* 9.1* 10.1*  HCT 32.5* 31.3* 28.4* 31.0*  MCV 90.3 90.7 91.6 91.4  PLT 152 161 148* 163   BNP: BNP (last 3 results)  Recent Labs  11/29/12 0655 06/03/13 2108 08/23/13 1534  PROBNP 1278.0* 1390.0* 2953.0*   CBG:  Recent Labs Lab 09/01/13 1635 09/01/13 2130 09/02/13 0510 09/02/13 0615 09/02/13 1127  GLUCAP 117* 200* 217* 197* 140*       Signed:  Laurel Smeltz  [facility] 09/02/2013, 3:21 PM

## 2013-09-02 NOTE — Discharge Instructions (Signed)
BMET IN 1 WEEK, RESULTS TO DR. Rockey Situ

## 2013-09-02 NOTE — Telephone Encounter (Signed)
Attempted to contact pt regarding discharge from [facility] on 09/02/13. Left detailed message asking pt to call w/ any questions or concerns about discharge instructions and/or medications. Advised pt that the has appt w/ Dr. Rockey Situ on 09/13/2013 2:00.

## 2013-09-02 NOTE — Procedures (Signed)
Successful US guided paracentesis from RLQ.  Yielded 4.1 liters of amber colored fluid.  No immediate complications.  Pt tolerated well.   Specimen was not sent for labs.  Tsosie Billing D PA-C 09/02/2013 11:18 AM

## 2013-09-02 NOTE — Progress Notes (Signed)
IV d/c'd.  Tele d/c'd.  Pt d/c'd to SNF.  Home meds and d/c instructions have been reviewed with pt.  Pt denies any questions or concerns.  Report given to receiving nurse at [facility] and nurse denies any questions or concerns.  Pt leaving unit via wheelchair and appears in no acute distress. Gae Gallop RN

## 2013-09-12 ENCOUNTER — Ambulatory Visit: Payer: Medicare Other | Admitting: Internal Medicine

## 2013-09-13 ENCOUNTER — Encounter: Payer: Commercial Managed Care - HMO | Admitting: Cardiovascular Disease

## 2013-09-20 ENCOUNTER — Ambulatory Visit: Payer: Medicare HMO | Admitting: Internal Medicine

## 2013-09-27 ENCOUNTER — Encounter: Payer: Medicare HMO | Admitting: Cardiovascular Disease

## 2013-09-28 ENCOUNTER — Ambulatory Visit: Payer: Medicare HMO | Admitting: Internal Medicine

## 2013-09-29 ENCOUNTER — Ambulatory Visit: Payer: Medicare HMO | Admitting: Internal Medicine

## 2013-10-03 ENCOUNTER — Ambulatory Visit (INDEPENDENT_AMBULATORY_CARE_PROVIDER_SITE_OTHER): Payer: Medicare HMO | Admitting: Internal Medicine

## 2013-10-03 ENCOUNTER — Encounter: Payer: Self-pay | Admitting: Internal Medicine

## 2013-10-03 VITALS — BP 100/60 | HR 81 | Temp 98.1°F | Wt 143.0 lb

## 2013-10-03 DIAGNOSIS — K209 Esophagitis, unspecified without bleeding: Secondary | ICD-10-CM

## 2013-10-03 DIAGNOSIS — R188 Other ascites: Principal | ICD-10-CM

## 2013-10-03 DIAGNOSIS — R519 Headache, unspecified: Secondary | ICD-10-CM | POA: Insufficient documentation

## 2013-10-03 DIAGNOSIS — I4891 Unspecified atrial fibrillation: Secondary | ICD-10-CM

## 2013-10-03 DIAGNOSIS — E1159 Type 2 diabetes mellitus with other circulatory complications: Secondary | ICD-10-CM

## 2013-10-03 DIAGNOSIS — I2589 Other forms of chronic ischemic heart disease: Secondary | ICD-10-CM

## 2013-10-03 DIAGNOSIS — K7460 Unspecified cirrhosis of liver: Secondary | ICD-10-CM

## 2013-10-03 DIAGNOSIS — I480 Paroxysmal atrial fibrillation: Secondary | ICD-10-CM

## 2013-10-03 DIAGNOSIS — R51 Headache: Secondary | ICD-10-CM

## 2013-10-03 DIAGNOSIS — I255 Ischemic cardiomyopathy: Secondary | ICD-10-CM

## 2013-10-03 DIAGNOSIS — J449 Chronic obstructive pulmonary disease, unspecified: Secondary | ICD-10-CM

## 2013-10-03 NOTE — Assessment & Plan Note (Signed)
May be medication related---will stop the doxy and then furosemide if needed 1 week follow up

## 2013-10-03 NOTE — Assessment & Plan Note (Signed)
On PPI Not sure why still with pain ?due to doxy?

## 2013-10-03 NOTE — Progress Notes (Signed)
Subjective:    Patient ID: [name], male    DOB: 1941-04-20, 73 y.o.   MRN: [id number]  HPI Here for hospital and rehab follow up Found to have ascites and cirrhosis Had paracentesis and amiodarone stopped---presumably the reason for the cirrhosis Home from [facility] for about a week  Having bad headaches ---seems to be associated with upset stomach Has used the oxycodone and tylenol Gets headache when he goes to eat Wonders about the spironolactone being involved  On doxycycline---not sure why he is on this Discussed---could be causing his symptoms  Feels the nebulizers are helping pulmicort clearly seems to help--not sure about the xopenex  Had been changed to detemir This caused lability and low sugars Went back on his lantus Has had some mild hypoglycemia on lantus  Current Outpatient Prescriptions on File Prior to Visit  Medication Sig Dispense Refill  . budesonide (PULMICORT) 0.25 MG/2ML nebulizer solution Take 2 mLs (0.25 mg total) by nebulization 2 (two) times daily.      . furosemide (LASIX) 20 MG tablet Take 1 tablet (20 mg total) by mouth daily.      . Multiple Vitamin (MULTIVITAMIN WITH MINERALS) TABS Take 1 tablet by mouth daily.      Marland Kitchen oxyCODONE (OXY IR/ROXICODONE) 5 MG immediate release tablet Take 1 tablet (5 mg total) by mouth every 6 (six) hours as needed for severe pain.  20 tablet  0  . pantoprazole (PROTONIX) 40 MG tablet Take 1 tablet (40 mg total) by mouth 2 (two) times daily.      Marland Kitchen spironolactone (ALDACTONE) 25 MG tablet Take 100 mg by mouth daily.        No current facility-administered medications on file prior to visit.    Allergies  Allergen Reactions  . Latex Other (See Comments)    Very thin skin, pulls off as removing bandaids     Past Medical History  Diagnosis Date  . Atrial fibrillation      S/P ablation  . CAD (coronary artery disease)     S/P MI 1980's----------------------Dr McDowell  . Hyperlipidemia   .  CHF (congestive heart failure)   . 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 BPH (benign prostatic hypertrophy)   . Stroke   . Sleep disturbance   . Bladder tumor 06/2013    seen at [facility] Dr Ellin Mayhew.  no cysto or biopsy as of 06/2013.  work up delayed by cellulitis.   Marland Kitchen Cryptogenic cirrhosis   . Chronic kidney disease 08/2013    SEVERE KIDNEY FAILURE  . GI bleeding 08/2013  . Esophageal varices in cirrhosis   . Splenomegaly   . Hyperplastic colon polyp   . Nephrolithiasis     Past Surgical History  Procedure Laterality Date  . Coronary artery bypass graft    . Appendectomy    . Tonsillectomy    . Upper gastrointestinal endoscopy      Gastritis , ? H. pylori 08/28/1999  . Atrial ablation surgery    . Esophagogastroduodenoscopy N/A 08/25/2013    Procedure: ESOPHAGOGASTRODUODENOSCOPY (EGD);  Surgeon: Lafayette Dragon, MD;  Location: [facility] ENDOSCOPY;  Service: Endoscopy;  Laterality: N/A;    Family History  Problem Relation Age of Onset  . Parkinson's disease Mother   . Heart disease Father   . Skin cancer Sister   . Heart attack Mother     History   Social History  . Marital Status: Single  Spouse Name: N/A    Number of Children: N/A  . Years of Education: N/A   Occupational History  . disabled due to heart disease    Social History Main Topics  . Smoking status: Former Smoker -- 1.00 packs/day    Types: Cigarettes    Quit date: 07/15/1973  . Smokeless tobacco: Never Used  . Alcohol Use: No  . Drug Use: No  . Sexual Activity: Not on file   Other Topics Concern  . Not on file   Social History Narrative   Single--lives alone   Stays with sister with Altzheimer's frequently   Has living will   Brother, Delfino Lovett has health care POA   Would accept resuscitation but no prolonged artificial ventilation   No feeding tube if cognitively unaware            Review of Systems Has lost considerable weight since my last  visit Sleep is not good--relates to headaches Still plans to see Dr Jasmine December about the bladder tumor    Objective:   Physical Exam  Constitutional: He appears well-developed. No distress.  Looks much better  Neck: Normal range of motion. No thyromegaly present.  Cardiovascular: Normal rate and regular rhythm.  Exam reveals no gallop.   No murmur heard. Pulmonary/Chest: Effort normal. No respiratory distress. He has no wheezes.  Faint basilar crackles  Abdominal:  Slight tenderness No ascites now  Musculoskeletal: He exhibits no edema.  Lymphadenopathy:    He has no cervical adenopathy.  Psychiatric: He has a normal mood and affect. His behavior is normal.          Assessment & Plan:

## 2013-10-03 NOTE — Assessment & Plan Note (Signed)
Still regular without the amiodarone Off coumadin due to coagulopathy

## 2013-10-03 NOTE — Progress Notes (Signed)
Pre visit review using our clinic review tool, if applicable. No additional management support is needed unless otherwise documented below in the visit note. 

## 2013-10-03 NOTE — Assessment & Plan Note (Signed)
Ascites better Hopefully liver function recovering off amiodarone

## 2013-10-03 NOTE — Assessment & Plan Note (Signed)
Seems to be compensated now Weight down May not need furosemide

## 2013-10-03 NOTE — Patient Instructions (Addendum)
Stop the doxycycline. If your headaches and stomach pain continue, try stopping the furosemide.

## 2013-10-03 NOTE — Assessment & Plan Note (Signed)
Better with the budesonide °

## 2013-10-04 ENCOUNTER — Telehealth: Payer: Self-pay | Admitting: Internal Medicine

## 2013-10-04 ENCOUNTER — Ambulatory Visit: Payer: Medicare HMO | Admitting: Internal Medicine

## 2013-10-04 LAB — COMPREHENSIVE METABOLIC PANEL
ALBUMIN: 3.9 g/dL (ref 3.5–5.2)
ALT: 35 U/L (ref 0–53)
AST: 26 U/L (ref 0–37)
Alkaline Phosphatase: 103 U/L (ref 39–117)
BUN: 24 mg/dL — AB (ref 6–23)
CO2: 30 mEq/L (ref 19–32)
Calcium: 10.1 mg/dL (ref 8.4–10.5)
Chloride: 100 mEq/L (ref 96–112)
Creatinine, Ser: 1.1 mg/dL (ref 0.4–1.5)
GFR: 67.84 mL/min (ref >60.00)
Glucose, Bld: 91 mg/dL (ref 70–99)
POTASSIUM: 4.8 meq/L (ref 3.5–5.1)
Sodium: 137 mEq/L (ref 135–145)
Total Bilirubin: 1.7 mg/dL — ABNORMAL HIGH (ref 0.3–1.2)
Total Protein: 7.8 g/dL (ref 6.0–8.3)

## 2013-10-04 LAB — CBC WITH DIFFERENTIAL/PLATELET
BASOS ABS: 0 10*3/uL (ref 0.0–0.1)
Basophils Relative: 1.1 % (ref 0.0–3.0)
EOS ABS: 0.1 10*3/uL (ref 0.0–0.7)
Eosinophils Relative: 1.9 % (ref 0.0–5.0)
HCT: 44.4 % (ref 39.0–52.0)
HEMOGLOBIN: 14.5 g/dL (ref 13.0–17.0)
LYMPHS ABS: 0.8 10*3/uL (ref 0.7–4.0)
Lymphocytes Relative: 20.7 % (ref 12.0–46.0)
MCHC: 32.7 g/dL (ref 30.0–36.0)
MCV: 94.7 fl (ref 78.0–100.0)
Monocytes Absolute: 0.9 10*3/uL (ref 0.1–1.0)
Monocytes Relative: 22.7 % — ABNORMAL HIGH (ref 3.0–12.0)
Neutro Abs: 2.1 10*3/uL (ref 1.4–7.7)
Neutrophils Relative %: 53.6 % (ref 43.0–77.0)
Platelets: 304 10*3/uL (ref 150.0–400.0)
RBC: 4.69 Mil/uL (ref 4.22–5.81)
RDW: 18.4 % — ABNORMAL HIGH (ref 11.5–14.6)
WBC: 3.9 10*3/uL — ABNORMAL LOW (ref 4.5–10.5)

## 2013-10-04 LAB — HEMOGLOBIN A1C: Hgb A1c MFr Bld: 6.8 % — ABNORMAL HIGH (ref 4.6–6.5)

## 2013-10-04 NOTE — Telephone Encounter (Signed)
Relevant patient education mailed to patient.  

## 2013-10-05 ENCOUNTER — Telehealth: Payer: Self-pay

## 2013-10-05 NOTE — Telephone Encounter (Signed)
Relevant patient education mailed to patient.  

## 2013-10-06 ENCOUNTER — Telehealth: Payer: Self-pay | Admitting: *Deleted

## 2013-10-06 NOTE — Telephone Encounter (Signed)
Pt called stating that [facility] does not except [employer] and wanted them to come pick the oxygen up so he wouldn't be charged for it. I spoke with Darlina Guys at [facility] and she states if the pt had the oxygen prior to 2015 they are still covered, they are currently working on a contract with [employer] and the current pt will be ok. [facility] will also call pt to reassure him of this.  Dr. Silvio Pate pt was asking about a social worker coming out to see him, would this be [facility]? Or would he need to contact his local DSS?

## 2013-10-06 NOTE — Telephone Encounter (Signed)
Let him know that as long as it is covered, he should keep it for now and we can get rid of it if he continues to do well.  No DSS--- just [facility] social worker (though hopefully he is doing better now and won't be unstable)

## 2013-10-07 NOTE — Telephone Encounter (Signed)
Spoke with patient and he does not want the oxygen. I will fax the order to [facility] to have it removed

## 2013-10-10 ENCOUNTER — Ambulatory Visit (INDEPENDENT_AMBULATORY_CARE_PROVIDER_SITE_OTHER): Payer: Medicare HMO | Admitting: Internal Medicine

## 2013-10-10 ENCOUNTER — Encounter: Payer: Self-pay | Admitting: Internal Medicine

## 2013-10-10 VITALS — BP 100/60 | HR 72 | Temp 98.0°F | Wt 141.0 lb

## 2013-10-10 DIAGNOSIS — I480 Paroxysmal atrial fibrillation: Secondary | ICD-10-CM

## 2013-10-10 DIAGNOSIS — D494 Neoplasm of unspecified behavior of bladder: Secondary | ICD-10-CM

## 2013-10-10 DIAGNOSIS — I4891 Unspecified atrial fibrillation: Secondary | ICD-10-CM

## 2013-10-10 DIAGNOSIS — E1159 Type 2 diabetes mellitus with other circulatory complications: Secondary | ICD-10-CM

## 2013-10-10 DIAGNOSIS — K7460 Unspecified cirrhosis of liver: Secondary | ICD-10-CM

## 2013-10-10 DIAGNOSIS — R188 Other ascites: Secondary | ICD-10-CM

## 2013-10-10 DIAGNOSIS — I2589 Other forms of chronic ischemic heart disease: Secondary | ICD-10-CM

## 2013-10-10 DIAGNOSIS — R51 Headache: Secondary | ICD-10-CM

## 2013-10-10 DIAGNOSIS — I255 Ischemic cardiomyopathy: Secondary | ICD-10-CM

## 2013-10-10 NOTE — Assessment & Plan Note (Signed)
Resolved  Probably was from the doxycycline

## 2013-10-10 NOTE — Progress Notes (Signed)
Pre visit review using our clinic review tool, if applicable. No additional management support is needed unless otherwise documented below in the visit note. 

## 2013-10-10 NOTE — Assessment & Plan Note (Signed)
Better Will change the furosemide to prn only

## 2013-10-10 NOTE — Assessment & Plan Note (Signed)
Now medically stable to proceed with cystoscopy

## 2013-10-10 NOTE — Assessment & Plan Note (Signed)
Doing well Lab Results  Component Value Date   HGBA1C 6.8* 10/03/2013   Would decrease the lantus if he gets any clinical hypoglycemia No change for now

## 2013-10-10 NOTE — Progress Notes (Signed)
Subjective:    Patient ID: [name], male    DOB: 1941/07/25, 73 y.o.   MRN: [id number]  HPI Is feeling better now Took 1-2 days after stopping doxy--headache is gone Still some stiffness in the back of his head---may be related to how he is sleeping  Stomach aches are gone Eating well  Sugars are staying good AM sugars all under 120 No hypoglycemic spells  Current Outpatient Prescriptions on File Prior to Visit  Medication Sig Dispense Refill  . budesonide (PULMICORT) 0.25 MG/2ML nebulizer solution Take 2 mLs (0.25 mg total) by nebulization 2 (two) times daily.      . furosemide (LASIX) 20 MG tablet Take 1 tablet (20 mg total) by mouth daily.      . insulin glargine (LANTUS) 100 UNIT/ML injection Inject 16 Units into the skin at bedtime.      . levalbuterol (XOPENEX) 0.63 MG/3ML nebulizer solution Take 0.63 mg by nebulization 3 (three) times daily as needed for wheezing or shortness of breath.      . Multiple Vitamin (MULTIVITAMIN WITH MINERALS) TABS Take 1 tablet by mouth daily.      Marland Kitchen oxyCODONE (OXY IR/ROXICODONE) 5 MG immediate release tablet Take 1 tablet (5 mg total) by mouth every 6 (six) hours as needed for severe pain.  20 tablet  0  . pantoprazole (PROTONIX) 40 MG tablet Take 1 tablet (40 mg total) by mouth 2 (two) times daily.      Marland Kitchen spironolactone (ALDACTONE) 25 MG tablet Take 100 mg by mouth daily.        No current facility-administered medications on file prior to visit.    Allergies  Allergen Reactions  . Latex Other (See Comments)    Very thin skin, pulls off as removing bandaids     Past Medical History  Diagnosis Date  . Atrial fibrillation      S/P ablation  . CAD (coronary artery disease)     S/P MI 1980's----------------------Dr McDowell  . Hyperlipidemia   . CHF (congestive heart failure)   . 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 BPH (benign prostatic hypertrophy)   . Stroke   . Sleep disturbance   .  Bladder tumor 06/2013    seen at [facility] Dr Ellin Mayhew.  no cysto or biopsy as of 06/2013.  work up delayed by cellulitis.   Marland Kitchen Cryptogenic cirrhosis   . Chronic kidney disease 08/2013    SEVERE KIDNEY FAILURE  . GI bleeding 08/2013  . Esophageal varices in cirrhosis   . Splenomegaly   . Hyperplastic colon polyp   . Nephrolithiasis     Past Surgical History  Procedure Laterality Date  . Coronary artery bypass graft    . Appendectomy    . Tonsillectomy    . Upper gastrointestinal endoscopy      Gastritis , ? H. pylori 08/28/1999  . Atrial ablation surgery    . Esophagogastroduodenoscopy N/A 08/25/2013    Procedure: ESOPHAGOGASTRODUODENOSCOPY (EGD);  Surgeon: Lafayette Dragon, MD;  Location: [facility] ENDOSCOPY;  Service: Endoscopy;  Laterality: N/A;    Family History  Problem Relation Age of Onset  . Parkinson's disease Mother   . Heart disease Father   . Skin cancer Sister   . Heart attack Mother     History   Social History  . Marital Status: Single    Spouse Name: N/A    Number of Children: N/A  . Years of Education: N/A  Occupational History  . disabled due to heart disease    Social History Main Topics  . Smoking status: Former Smoker -- 1.00 packs/day    Types: Cigarettes    Quit date: 07/15/1973  . Smokeless tobacco: Never Used  . Alcohol Use: No  . Drug Use: No  . Sexual Activity: Not on file   Other Topics Concern  . Not on file   Social History Narrative   Single--lives alone   Stays with sister with Altzheimer's frequently   Has living will   Brother, Delfino Lovett has health care POA   Would accept resuscitation but no prolonged artificial ventilation   No feeding tube if cognitively unaware            Review of Systems Sleeping well Still gets SOB-- able to do some more activity before DOE    Objective:   Physical Exam  Constitutional: He appears well-developed. No distress.  Neck: Normal range of motion. Neck supple. No thyromegaly present.    Cardiovascular: Normal rate and regular rhythm.  Exam reveals no gallop.   ?very slight systolic murmur at apex  Pulmonary/Chest: Effort normal and breath sounds normal. No respiratory distress. He has no wheezes. He has no rales.  Musculoskeletal: He exhibits no edema.  Lymphadenopathy:    He has no cervical adenopathy.          Assessment & Plan:

## 2013-10-10 NOTE — Assessment & Plan Note (Signed)
Regular now even off the amiodarone

## 2013-10-10 NOTE — Assessment & Plan Note (Signed)
No ascites still LFTs normalized

## 2013-10-12 ENCOUNTER — Other Ambulatory Visit: Payer: Self-pay | Admitting: Urology

## 2013-10-13 ENCOUNTER — Other Ambulatory Visit: Payer: Self-pay | Admitting: *Deleted

## 2013-10-13 ENCOUNTER — Encounter (HOSPITAL_COMMUNITY): Payer: Self-pay | Admitting: Pharmacy Technician

## 2013-10-13 MED ORDER — PANTOPRAZOLE SODIUM 40 MG PO TBEC: 40.0000 mg | DELAYED_RELEASE_TABLET | Freq: Every day | ORAL | Status: DC

## 2013-10-13 MED ORDER — FUROSEMIDE 20 MG PO TABS: 20.0000 mg | ORAL_TABLET | Freq: Every day | ORAL | Status: DC

## 2013-10-14 NOTE — Patient Instructions (Addendum)
      Your procedure is scheduled on: 10/20/13 THURSDAY  Report to [facility] at   [place]    AM.  Call this number if you have problems the morning of surgery: [place]  BEDTIME OR MIDNIGHT Wednesday NIGHT  Do not eat food  Or drink :After Midnight. Wednesday NIGHT   Take these medicines the morning of surgery with A SIP OF WATER: No oral meds,  NO INSULIN MORNING OF SURGERY  Use Pulmicort           May use Xopenex /  Take Oxycodone   .  Contacts, dentures or partial plates, or metal hairpins  can not be worn to surgery. Your family will be responsible for glasses, dentures, hearing aides while you are in surgery  Leave suitcase in the car. After surgery it may be brought to your room.  For patients admitted to the hospital, checkout time is 11:00 AM day of  discharge.                DO NOT WEAR JEWELRY, LOTIONS, POWDERS, OR PERFUMES.  WOMEN-- DO NOT SHAVE LEGS OR UNDERARMS FOR 48 HOURS BEFORE SHOWERS. MEN MAY SHAVE FACE.  Patients discharged the day of surgery will not be allowed to drive home. IF going home the day of surgery, you must have a driver and someone to stay with you for the first 24 hours  Name and phone number of your driver: sister   Billy Jean [place]                                                  Patient Signature _____________________________

## 2013-10-17 NOTE — H&P (Signed)
Urology History and Physical Exam  CC: Bladder tumor.  HPI:  73 year old male presents today for bladder tumor.  This was discovered in November 2014 for workup of gross hematuria.  He was scheduled for transurethral resection of bladder tumor, but his comorbidities prevented him from being able to proceed to the operating room.  He has a history of coronary artery disease, CHF, COPD, diabetes, and atrial fibrillation.  His tumor was 5 mm in size in November 2014.  Is located on the white right lateral bladder wall.  This is concerning for cancer.  He presents today for cystoscopy, bladder biopsy, possible transurethral resection of bladder tumor, possible bilateral retrograde pyelograms, and rectal exam under anesthesia.  We have discussed the risks, benefits, alternatives, and likely achieving goals.  For his resuscitation status, he states he would accept resuscitation but no ventilator support beyond 6 months.  He has been cleared by his PCP, Dr. Silvio Pate, for surgery and he has been cleared by his cardiologist, Dr. Rockey Situ, for surgery.  He is previously clear to hold his Coumadin for 5 days prior to surgery, but the patient has been taken off the medicine permanently as he states he's had an ablation for his atrial fibrillation.  He was recently admitted to the hospital in January 2015 for an upper GI bleed.  UA 10/17/13 was negative for signs of infection.  PMH: Past Medical History  Diagnosis Date  . Atrial fibrillation      S/P ablation  . CAD (coronary artery disease)     S/P MI 1980's----------------------Dr McDowell  . Hyperlipidemia   . CHF (congestive heart failure)   . 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 BPH (benign prostatic hypertrophy)   . Stroke   . Sleep disturbance   . Bladder tumor 06/2013    seen at [facility] Dr Ellin Mayhew.  no cysto or biopsy as of 06/2013.  work up delayed by cellulitis.   Marland Kitchen Cryptogenic cirrhosis   . Chronic kidney  disease 08/2013    SEVERE KIDNEY FAILURE  . GI bleeding 08/2013  . Esophageal varices in cirrhosis   . Splenomegaly   . Hyperplastic colon polyp   . Nephrolithiasis     PSH: Past Surgical History  Procedure Laterality Date  . Coronary artery bypass graft    . Appendectomy    . Tonsillectomy    . Upper gastrointestinal endoscopy      Gastritis , ? H. pylori 08/28/1999  . Atrial ablation surgery    . Esophagogastroduodenoscopy N/A 08/25/2013    Procedure: ESOPHAGOGASTRODUODENOSCOPY (EGD);  Surgeon: Lafayette Dragon, MD;  Location: [facility] ENDOSCOPY;  Service: Endoscopy;  Laterality: N/A;    Allergies: Allergies  Allergen Reactions  . Latex Other (See Comments)    Very thin skin, pulls off as removing bandaids   . Adhesive [Tape]     Pulls skin off    Medications: No prescriptions prior to admission     Social History: History   Social History  . Marital Status: Single    Spouse Name: N/A    Number of Children: N/A  . Years of Education: N/A   Occupational History  . disabled due to heart disease    Social History Main Topics  . Smoking status: Former Smoker -- 1.00 packs/day    Types: Cigarettes    Quit date: 07/15/1973  . Smokeless tobacco: Never Used  . Alcohol Use: No  . Drug Use: No  . Sexual Activity: Not  on file   Other Topics Concern  . Not on file   Social History Narrative   Single--lives alone   Stays with sister with Altzheimer's frequently   Has living will   Brother, Delfino Lovett has health care POA   Would accept resuscitation but no prolonged artificial ventilation   No feeding tube if cognitively unaware             Family History: Family History  Problem Relation Age of Onset  . Parkinson's disease Mother   . Heart disease Father   . Skin cancer Sister   . Heart attack Mother     Review of Systems: Positive: None. Negative: Fever, SOB, chest pain.  A further 10 point review of systems was negative except what is listed in the  HPI.  Physical Exam: Filed Vitals:   10/20/13 0534  BP: 95/53  Pulse: 69  Temp: 97.5 F (36.4 C)  Resp: 16    General: No acute distress.  Awake. Head:  Normocephalic.  Atraumatic. ENT:  EOMI.  Mucous membranes moist Neck:  Supple.  No lymphadenopathy. CV:  S1 present. S2 present. Regular rate. Pulmonary: Equal effort bilaterally.  Clear to auscultation bilaterally. Abdomen: Soft.  Non- tender to palpation. Skin:  Normal turgor.  No visible rash. Extremity: No gross deformity of bilateral upper extremities.  No gross deformity of    bilateral lower extremities. Neurologic: Alert. Appropriate mood.    Studies:  No results found for this basename: HGB, WBC, PLT,  in the last 72 hours  No results found for this basename: NA, K, CL, CO2, BUN, CREATININE, CALCIUM, MAGNESIUM, GFRNONAA, GFRAA,  in the last 72 hours   No results found for this basename: PT, INR, APTT,  in the last 72 hours   No components found with this basename: ABG,     Assessment:  Bladder tumor.  Plan: To OR  for bladder biopsy, cystoscopy, possible bilateral retrograde pyelograms, possible transurethral resection of bladder tumor, and rectal examination under anesthesia.

## 2013-10-18 ENCOUNTER — Encounter (HOSPITAL_COMMUNITY): Payer: Self-pay

## 2013-10-18 ENCOUNTER — Encounter (HOSPITAL_COMMUNITY)
Admission: RE | Admit: 2013-10-18 | Discharge: 2013-10-18 | Disposition: A | Discharge status: Home / Self Care | Payer: Medicare HMO | Source: Ambulatory Visit | Attending: Urology | Admitting: Urology

## 2013-10-18 HISTORY — DX: Personal history of other diseases of the respiratory system: Z87.09

## 2013-10-18 HISTORY — DX: Emphysema, unspecified: J43.9

## 2013-10-18 HISTORY — DX: Acute myocardial infarction, unspecified: I21.9

## 2013-10-18 LAB — BASIC METABOLIC PANEL
BUN: 35 mg/dL — ABNORMAL HIGH (ref 6–23)
CALCIUM: 9.9 mg/dL (ref 8.4–10.5)
CO2: 27 mEq/L (ref 19–32)
CREATININE: 1.03 mg/dL (ref 0.50–1.35)
Chloride: 101 mEq/L (ref 96–112)
GFR calc Af Amer: 82 mL/min — ABNORMAL LOW (ref >90)
GFR, EST NON AFRICAN AMERICAN: 71 mL/min — AB (ref >90)
GLUCOSE: 256 mg/dL — AB (ref 70–99)
Potassium: 5.6 mEq/L — ABNORMAL HIGH (ref 3.7–5.3)
Sodium: 138 mEq/L (ref 137–147)

## 2013-10-18 NOTE — Progress Notes (Signed)
CBC with diff 3/2 /15,chest x ray 1/15, ekg 1/15, clearance with LOV Dr Tyna Jaksch  All in [facility].  Faxed BMET to Dr Jasmine December via [employer].  Also left voice mail with Foster Simpson at [facility] to assure MD has reviewed this.

## 2013-10-20 ENCOUNTER — Encounter (HOSPITAL_COMMUNITY): Payer: Self-pay | Admitting: *Deleted

## 2013-10-20 ENCOUNTER — Encounter (HOSPITAL_COMMUNITY): Payer: Medicare HMO | Admitting: Registered Nurse

## 2013-10-20 ENCOUNTER — Encounter (HOSPITAL_COMMUNITY)
Admission: RE | Disposition: A | Discharge status: Home / Self Care | Payer: Self-pay | Source: Ambulatory Visit | Attending: Urology

## 2013-10-20 ENCOUNTER — Ambulatory Visit (HOSPITAL_COMMUNITY)
Admission: RE | Admit: 2013-10-20 | Discharge: 2013-10-20 | Disposition: A | Discharge status: Home / Self Care | Payer: Medicare HMO | Source: Ambulatory Visit | Attending: Urology | Admitting: Urology

## 2013-10-20 ENCOUNTER — Ambulatory Visit (HOSPITAL_COMMUNITY): Payer: Medicare HMO | Admitting: Registered Nurse

## 2013-10-20 DIAGNOSIS — K7460 Unspecified cirrhosis of liver: Secondary | ICD-10-CM | POA: Insufficient documentation

## 2013-10-20 DIAGNOSIS — J4489 Other specified chronic obstructive pulmonary disease: Secondary | ICD-10-CM | POA: Insufficient documentation

## 2013-10-20 DIAGNOSIS — I739 Peripheral vascular disease, unspecified: Secondary | ICD-10-CM | POA: Insufficient documentation

## 2013-10-20 DIAGNOSIS — J449 Chronic obstructive pulmonary disease, unspecified: Secondary | ICD-10-CM | POA: Diagnosis not present

## 2013-10-20 DIAGNOSIS — G4730 Sleep apnea, unspecified: Secondary | ICD-10-CM | POA: Diagnosis not present

## 2013-10-20 DIAGNOSIS — Z9089 Acquired absence of other organs: Secondary | ICD-10-CM | POA: Insufficient documentation

## 2013-10-20 DIAGNOSIS — Z794 Long term (current) use of insulin: Secondary | ICD-10-CM | POA: Insufficient documentation

## 2013-10-20 DIAGNOSIS — D494 Neoplasm of unspecified behavior of bladder: Secondary | ICD-10-CM

## 2013-10-20 DIAGNOSIS — Z8673 Personal history of transient ischemic attack (TIA), and cerebral infarction without residual deficits: Secondary | ICD-10-CM | POA: Insufficient documentation

## 2013-10-20 DIAGNOSIS — N189 Chronic kidney disease, unspecified: Secondary | ICD-10-CM | POA: Insufficient documentation

## 2013-10-20 DIAGNOSIS — I2510 Atherosclerotic heart disease of native coronary artery without angina pectoris: Secondary | ICD-10-CM | POA: Insufficient documentation

## 2013-10-20 DIAGNOSIS — E785 Hyperlipidemia, unspecified: Secondary | ICD-10-CM | POA: Diagnosis not present

## 2013-10-20 DIAGNOSIS — E1129 Type 2 diabetes mellitus with other diabetic kidney complication: Secondary | ICD-10-CM | POA: Insufficient documentation

## 2013-10-20 DIAGNOSIS — C672 Malignant neoplasm of lateral wall of bladder: Secondary | ICD-10-CM | POA: Diagnosis not present

## 2013-10-20 DIAGNOSIS — Z87891 Personal history of nicotine dependence: Secondary | ICD-10-CM | POA: Insufficient documentation

## 2013-10-20 DIAGNOSIS — I252 Old myocardial infarction: Secondary | ICD-10-CM | POA: Insufficient documentation

## 2013-10-20 DIAGNOSIS — Z951 Presence of aortocoronary bypass graft: Secondary | ICD-10-CM | POA: Insufficient documentation

## 2013-10-20 DIAGNOSIS — D759 Disease of blood and blood-forming organs, unspecified: Secondary | ICD-10-CM | POA: Insufficient documentation

## 2013-10-20 HISTORY — PX: CYSTOSCOPY WITH RETROGRADE PYELOGRAM, URETEROSCOPY AND STENT PLACEMENT: SHX5789

## 2013-10-20 LAB — COMPREHENSIVE METABOLIC PANEL
ALK PHOS: 121 U/L — AB (ref 39–117)
ALT: 31 U/L (ref 0–53)
AST: 25 U/L (ref 0–37)
Albumin: 3.7 g/dL (ref 3.5–5.2)
BUN: 38 mg/dL — AB (ref 6–23)
CALCIUM: 9.8 mg/dL (ref 8.4–10.5)
CO2: 22 mEq/L (ref 19–32)
Chloride: 101 mEq/L (ref 96–112)
Creatinine, Ser: 1.11 mg/dL (ref 0.50–1.35)
GFR calc non Af Amer: 65 mL/min — ABNORMAL LOW (ref >90)
GFR, EST AFRICAN AMERICAN: 75 mL/min — AB (ref >90)
Glucose, Bld: 168 mg/dL — ABNORMAL HIGH (ref 70–99)
Potassium: 4.2 mEq/L (ref 3.7–5.3)
Sodium: 137 mEq/L (ref 137–147)
Total Bilirubin: 1.5 mg/dL — ABNORMAL HIGH (ref 0.3–1.2)
Total Protein: 7.6 g/dL (ref 6.0–8.3)

## 2013-10-20 LAB — GLUCOSE, CAPILLARY
GLUCOSE-CAPILLARY: 185 mg/dL — AB (ref 70–99)
Glucose-Capillary: 161 mg/dL — ABNORMAL HIGH (ref 70–99)

## 2013-10-20 SURGERY — CYSTOURETEROSCOPY, WITH RETROGRADE PYELOGRAM AND STENT INSERTION
Anesthesia: General

## 2013-10-20 MED ORDER — BELLADONNA ALKALOIDS-OPIUM 16.2-60 MG RE SUPP
RECTAL | Status: DC | PRN
  Administered 2013-10-20: 1 via RECTAL

## 2013-10-20 MED ORDER — ETOMIDATE 2 MG/ML IV SOLN
INTRAVENOUS | Status: AC
  Filled 2013-10-20: qty 10

## 2013-10-20 MED ORDER — STERILE WATER FOR IRRIGATION IR SOLN
Status: DC | PRN
  Administered 2013-10-20: 3000 mL

## 2013-10-20 MED ORDER — EPHEDRINE SULFATE 50 MG/ML IJ SOLN
INTRAMUSCULAR | Status: AC
  Filled 2013-10-20: qty 1

## 2013-10-20 MED ORDER — ETOMIDATE 2 MG/ML IV SOLN
INTRAVENOUS | Status: DC | PRN
  Administered 2013-10-20: 16 mg via INTRAVENOUS

## 2013-10-20 MED ORDER — LIDOCAINE HCL (CARDIAC) 20 MG/ML IV SOLN
INTRAVENOUS | Status: DC | PRN
  Administered 2013-10-20: 80 mg via INTRAVENOUS

## 2013-10-20 MED ORDER — BELLADONNA ALKALOIDS-OPIUM 16.2-60 MG RE SUPP
RECTAL | Status: AC
  Filled 2013-10-20: qty 1

## 2013-10-20 MED ORDER — IOHEXOL 300 MG/ML  SOLN
INTRAMUSCULAR | Status: DC | PRN
  Administered 2013-10-20: 10 mL

## 2013-10-20 MED ORDER — LIDOCAINE HCL (CARDIAC) 20 MG/ML IV SOLN
INTRAVENOUS | Status: AC
  Filled 2013-10-20: qty 5

## 2013-10-20 MED ORDER — GLYCOPYRROLATE 0.2 MG/ML IJ SOLN
INTRAMUSCULAR | Status: DC | PRN
  Administered 2013-10-20: .2 mg via INTRAVENOUS

## 2013-10-20 MED ORDER — ONDANSETRON HCL 4 MG/2ML IJ SOLN
INTRAMUSCULAR | Status: DC | PRN
  Administered 2013-10-20: 4 mg via INTRAVENOUS

## 2013-10-20 MED ORDER — SENNOSIDES-DOCUSATE SODIUM 8.6-50 MG PO TABS: 1.0000 | ORAL_TABLET | Freq: Two times a day (BID) | ORAL | Status: AC

## 2013-10-20 MED ORDER — SUCCINYLCHOLINE CHLORIDE 20 MG/ML IJ SOLN
INTRAMUSCULAR | Status: DC | PRN
  Administered 2013-10-20: 100 mg via INTRAVENOUS

## 2013-10-20 MED ORDER — SODIUM CHLORIDE 0.9 % IR SOLN
Status: DC | PRN
  Administered 2013-10-20: 1000 mL

## 2013-10-20 MED ORDER — CISATRACURIUM BESYLATE (PF) 10 MG/5ML IV SOLN
INTRAVENOUS | Status: DC | PRN
  Administered 2013-10-20: 2 mg via INTRAVENOUS

## 2013-10-20 MED ORDER — LIDOCAINE HCL 2 % EX GEL
CUTANEOUS | Status: DC | PRN
  Administered 2013-10-20: 1 via URETHRAL

## 2013-10-20 MED ORDER — PHENAZOPYRIDINE HCL 100 MG PO TABS: 100.0000 mg | ORAL_TABLET | Freq: Three times a day (TID) | ORAL | Status: AC | PRN

## 2013-10-20 MED ORDER — NEOSTIGMINE METHYLSULFATE 1 MG/ML IJ SOLN
INTRAMUSCULAR | Status: AC
  Filled 2013-10-20: qty 10

## 2013-10-20 MED ORDER — ONDANSETRON HCL 4 MG/2ML IJ SOLN
INTRAMUSCULAR | Status: AC
  Filled 2013-10-20: qty 2

## 2013-10-20 MED ORDER — SODIUM CHLORIDE 0.9 % IJ SOLN
INTRAMUSCULAR | Status: AC
  Filled 2013-10-20: qty 10

## 2013-10-20 MED ORDER — HYDROCODONE-ACETAMINOPHEN 5-325 MG PO TABS: 1.0000 | ORAL_TABLET | ORAL | Status: DC | PRN

## 2013-10-20 MED ORDER — NEOSTIGMINE METHYLSULFATE 1 MG/ML IJ SOLN
INTRAMUSCULAR | Status: DC | PRN
  Administered 2013-10-20: 2 mg via INTRAVENOUS

## 2013-10-20 MED ORDER — LIDOCAINE HCL 2 % EX GEL
CUTANEOUS | Status: AC
  Filled 2013-10-20: qty 10

## 2013-10-20 MED ORDER — PROPOFOL 10 MG/ML IV BOLUS
INTRAVENOUS | Status: AC
  Filled 2013-10-20: qty 20

## 2013-10-20 MED ORDER — CEPHALEXIN 500 MG PO CAPS: 500.0000 mg | ORAL_CAPSULE | Freq: Three times a day (TID) | ORAL | Status: DC

## 2013-10-20 MED ORDER — FENTANYL CITRATE 0.05 MG/ML IJ SOLN
INTRAMUSCULAR | Status: DC | PRN
  Administered 2013-10-20: 50 ug via INTRAVENOUS

## 2013-10-20 MED ORDER — SODIUM CHLORIDE 0.9 % IV SOLN
INTRAVENOUS | Status: DC | PRN
  Administered 2013-10-20: 07:00:00 via INTRAVENOUS

## 2013-10-20 MED ORDER — FENTANYL CITRATE 0.05 MG/ML IJ SOLN
INTRAMUSCULAR | Status: AC
  Filled 2013-10-20: qty 2

## 2013-10-20 MED ORDER — FENTANYL CITRATE 0.05 MG/ML IJ SOLN
25.0000 ug | INTRAMUSCULAR | Status: DC | PRN
Start: 2013-10-20 — End: 2013-10-20

## 2013-10-20 MED ORDER — GLYCOPYRROLATE 0.2 MG/ML IJ SOLN
INTRAMUSCULAR | Status: AC
  Filled 2013-10-20: qty 1

## 2013-10-20 MED ORDER — CEFAZOLIN SODIUM-DEXTROSE 2-3 GM-% IV SOLR
2.0000 g | INTRAVENOUS | Status: AC
  Administered 2013-10-20: 2 g via INTRAVENOUS

## 2013-10-20 MED ORDER — CEFAZOLIN SODIUM-DEXTROSE 2-3 GM-% IV SOLR
INTRAVENOUS | Status: AC
  Filled 2013-10-20: qty 50

## 2013-10-20 MED ORDER — ATROPINE SULFATE 0.4 MG/ML IJ SOLN
INTRAMUSCULAR | Status: AC
  Filled 2013-10-20: qty 2

## 2013-10-20 SURGICAL SUPPLY — 31 items
BAG URO CATCHER STRL LF (DRAPE) ×3 IMPLANT
BASKET LASER NITINOL 1.9FR (BASKET) IMPLANT
BASKET STNLS GEMINI 4WIRE 3FR (BASKET) IMPLANT
BASKET ZERO TIP NITINOL 2.4FR (BASKET) IMPLANT
BSKT STON RTRVL 120 1.9FR (BASKET)
BSKT STON RTRVL GEM 120X11 3FR (BASKET)
CATH CLEAR GEL 3F BACKSTOP (CATHETERS) IMPLANT
CATH URET 5FR 28IN CONE TIP (BALLOONS)
CATH URET 5FR 28IN OPEN ENDED (CATHETERS) ×3 IMPLANT
CATH URET 5FR 70CM CONE TIP (BALLOONS) IMPLANT
CATH URET DUAL LUMEN 6-10FR 50 (CATHETERS) IMPLANT
CLOTH BEACON ORANGE TIMEOUT ST (SAFETY) ×3 IMPLANT
DRAPE CAMERA CLOSED 9X96 (DRAPES) ×3 IMPLANT
FIBER LASER FLEXIVA 200 (UROLOGICAL SUPPLIES) IMPLANT
FIBER LASER FLEXIVA 365 (UROLOGICAL SUPPLIES) IMPLANT
GLOVE BIOGEL M 7.0 STRL (GLOVE) IMPLANT
GLOVE SURG SS PI 6.5 STRL IVOR (GLOVE) ×6 IMPLANT
GLOVE SURG SS PI 7.0 STRL IVOR (GLOVE) ×3 IMPLANT
GOWN STRL REUS W/TWL LRG LVL3 (GOWN DISPOSABLE) ×3 IMPLANT
GOWN STRL REUS W/TWL XL LVL3 (GOWN DISPOSABLE) ×6 IMPLANT
GUIDEWIRE ANG ZIPWIRE 038X150 (WIRE) IMPLANT
GUIDEWIRE STR DUAL SENSOR (WIRE) ×3 IMPLANT
IV NS IRRIG 3000ML ARTHROMATIC (IV SOLUTION) IMPLANT
PACK CYSTO (CUSTOM PROCEDURE TRAY) ×3 IMPLANT
SCRUB PCMX 4 OZ (MISCELLANEOUS) IMPLANT
SHEATH ACCESS URETERAL 38CM (SHEATH) IMPLANT
SHEATH URET ACCESS 12FR/35CM (UROLOGICAL SUPPLIES) IMPLANT
SHEATH URET ACCESS 12FR/55CM (UROLOGICAL SUPPLIES) IMPLANT
SYRINGE IRR TOOMEY STRL 70CC (SYRINGE) IMPLANT
TUBING CONNECTING 10 (TUBING) ×2 IMPLANT
TUBING CONNECTING 10' (TUBING) ×1

## 2013-10-20 NOTE — Preoperative (Signed)
Beta Blockers   Reason not to administer Beta Blockers:Not Applicable 

## 2013-10-20 NOTE — Anesthesia Preprocedure Evaluation (Addendum)
Anesthesia Evaluation  Patient identified by MRN, date of birth, ID band Patient awake    Reviewed: Allergy & Precautions, H&P , NPO status , Patient's Chart, lab work & pertinent test results  Airway Mallampati: II TM Distance: >3 FB Neck ROM: Full    Dental no notable dental hx.    Pulmonary sleep apnea and Oxygen sleep apnea , COPD COPD inhaler, former smoker,  Oxygen at night. breath sounds clear to auscultation  Pulmonary exam normal       Cardiovascular hypertension, Pt. on medications + CAD, + Past MI, + Peripheral Vascular Disease and +CHF + dysrhythmias Atrial Fibrillation Rhythm:Regular Rate:Normal  Last cardiology visit in November 2014 reviewed. Cardiac status stable.  EF 25-30% in cardiology note. 35-40% on echo. Pulmonary hypertension.   Neuro/Psych  Headaches, CVA negative psych ROS   GI/Hepatic (+) Cirrhosis -  Esophageal Varices     ,   Endo/Other  negative endocrine ROSdiabetes, Insulin Dependent  Renal/GU Renal disease  negative genitourinary   Musculoskeletal negative musculoskeletal ROS (+)   Abdominal   Peds negative pediatric ROS (+)  Hematology  (+) Blood dyscrasia, ,   Anesthesia Other Findings   Reproductive/Obstetrics negative OB ROS                      Anesthesia Physical Anesthesia Plan  ASA: IV  Anesthesia Plan: General   Post-op Pain Management:    Induction: Intravenous  Airway Management Planned: Oral ETT  Additional Equipment:   Intra-op Plan:   Post-operative Plan: Extubation in OR  Informed Consent: I have reviewed the patients History and Physical, chart, labs and discussed the procedure including the risks, benefits and alternatives for the proposed anesthesia with the patient or authorized representative who has indicated his/her understanding and acceptance.   Dental advisory given  Plan Discussed with: CRNA  Anesthesia Plan Comments:  (At increased risk for cardiac or pulmonary complications was discussed with patient.)      Anesthesia Quick Evaluation

## 2013-10-20 NOTE — Transfer of Care (Signed)
Immediate Anesthesia Transfer of Care Note  Patient: [name]  Procedure(s) Performed: Procedure(s): CYSTOSCOPY RETROGRADE PYELOGRAM AND BLADDER BIOPSY RECTAL EXAM UNDER ANESTHESIA (N/A)  Patient Location: PACU  Anesthesia Type:General  Level of Consciousness: awake, alert , oriented and patient cooperative  Airway & Oxygen Therapy: Patient Spontanous Breathing and Patient connected to face mask oxygen  Post-op Assessment: Report given to PACU RN, Post -op Vital signs reviewed and stable and Patient moving all extremities  Post vital signs: Reviewed and stable  Complications: No apparent anesthesia complications

## 2013-10-20 NOTE — Anesthesia Postprocedure Evaluation (Signed)
  Anesthesia Post-op Note  Patient: [name]  Procedure(s) Performed: Procedure(s) (LRB): CYSTOSCOPY RETROGRADE PYELOGRAM AND BLADDER BIOPSY RECTAL EXAM UNDER ANESTHESIA (N/A)  Patient Location: PACU  Anesthesia Type: General  Level of Consciousness: awake and alert   Airway and Oxygen Therapy: Patient Spontanous Breathing  Post-op Pain: mild  Post-op Assessment: Post-op Vital signs reviewed, Patient's Cardiovascular Status Stable, Respiratory Function Stable, Patent Airway and No signs of Nausea or vomiting  Last Vitals:  Filed Vitals:   10/20/13 0900  BP: 105/53  Pulse: 82  Temp: 36.6 C  Resp: 18    Post-op Vital Signs: stable   Complications: No apparent anesthesia complications

## 2013-10-20 NOTE — Discharge Instructions (Signed)
DISCHARGE INSTRUCTIONS FOR TRANSURETHRAL SURGERY OF BLADDER  MEDICATIONS:   1. Resume all your other meds from home.    ACTIVITY 1. No heavy lifting >10 pounds for 2 weeks 2. No sexual activity for 2 weeks 3. No strenuous activity for 2 weeks 4. No driving while on narcotic pain medications 5. Drink plenty of water 6. Continue to walk at home - you can still get blood clots when you are at  home, so keep active, but don't over do it. 7. Your urine may have some blood in it - make sure you drink plenty of water,  call or come to the ER immediately if your catheter stops draining  FOLEY CATHETER  (If you go home with a catheter in place.) 1. Make sure your catheter is attached to your leg at all times - do not let  anything pull on it 2. If the urine is your tube starts looking dark red or if it stops draining,  call us immediately or come to the ER 3. Drink plenty of water, if you do notice your urine looking darking, sit down,  relax and drink lots of water 4. You will be given a leg bag as well as an overnight bag for your catheter -  MAKE SURE ATTACHED TO YOUR LEG AT ALL TIMES WITH TAPE OR LEG STRAP  BATHING 1. You can shower.  You may take a bath unless you have a Foley catheter in place.  SIGNS/SYMPTOMS TO CALL: 1. Please call us if you have a fever greater than 101.5, uncontrolled  nausea/vomiting, uncontrolled pain, dizziness, unable to urinate, chest pain, shortness of breath, leg swelling, leg pain, or any other concerns  or questions.  You can reach [place] at [phone].

## 2013-10-20 NOTE — Op Note (Signed)
Urology Operative Report  Date of Procedure: 10/20/13  Surgeon: Rolan Bucco, MD Assistant:  None  Preoperative Diagnosis: Bladder tumor Postoperative Diagnosis:  Same  Procedure(s): Bladder biopsy with cystoscopy. Bilateral retrograde pyelogram with interpretation. Rectal exam under anesthesia.  Estimated blood loss: Minimal  Specimen: Bladder biopsy sent to pathology.  Drains: None  Complications: None  Findings: Small bladder tumor (<15mm). Negative filling defects or hydronephrosis on bilateral retrograde pyelograms.  History of present illness: 73 year old male with multiple comorbidities presents today for bladder tumor. This was discovered November 2014. He's been cleared by his cardiologist and his primary physician.   Procedure in detail: After informed consent was obtained, the patient was taken to the operating room. They were placed in the supine position. SCDs were turned on and in place. IV antibiotics were infused, and general anesthesia was induced. A timeout was performed in which the correct patient, surgical site, and procedure were identified and agreed upon by the team.  The patient was placed in a dorsolithotomy position, making sure to pad all pertinent neurovascular pressure points. The genitals were prepped and draped in the usual sterile fashion.  A rigid cystoscope was advanced through the urethra and into the bladder. The bladder was drained. It was then fully distended and evaluated in a systematic fashion with a 30 and 70 lens to visualize the entire surface of the bladder. Only one bladder tumor was noted. This is located in the right lateral bladder wall. This was less than 5 mm in size as noted on office cystoscopy. It was papillary in nature. There was noted to be clear urine draining from each ureter orifice.  Bladder biopsy was carried out using cold cup biopsy forceps. Water irrigation was used and a Bugbee electrode was used to fulgurate  the biopsy site and the area around the site. This was negative for gross perforation of the bladder. There was good hemostasis.  Obtaining bilateral retrograde PolyGram's:  I inserted a 5 [place] ureter catheter into the right ureter orifice. I injected 5 cc of Omnipaque. This was negative for filling defects or hydronephrosis. This side drained well.  I inserted a 5 [place] ureter catheter into the left ureter orifice. I injected 5 cc of Omnipaque. This was negative for filling defects or hydronephrosis. This side drained well.  The bladder was drained. I placed 10 cc of lidocaine jelly into the urethra. I placed a belladonna and opium suppository to rectum.  I performed a rectal examination under anesthesia. Prostate is partially 30-35 g in size. Negative for nodules or induration. No fluctuance. Seminal vesicles are not palpated. Negative rectal tumors or masses.  This completed the procedure, anesthesia was reversed, the patient was placed in a supine position, and he was taken to the PACU in a stable condition.  All counts were correct at the end of the case.

## 2013-10-24 ENCOUNTER — Encounter (HOSPITAL_COMMUNITY): Payer: Self-pay | Admitting: Urology

## 2013-11-09 ENCOUNTER — Telehealth: Payer: Self-pay | Admitting: Internal Medicine

## 2013-11-09 NOTE — Telephone Encounter (Signed)
Patient Information:  Caller Name: [place]  Phone: [phone]  Patient: [name], [name]  Gender: Male  DOB: 1941-06-16  Age: 73 Years  PCP: [name] [facility])  Office Follow Up:  Does the office need to follow up with this patient?: Yes  Instructions For The Office: Please call ASAP.  RN Note:  Pt reports SOB with shallow breathing. Requests an appt for 11/10/13.  Symptoms  Reason For Call & Symptoms: Weakness, SOB with ambulation  Reviewed Health History In EMR: No  Reviewed Medications In EMR: No  Reviewed Allergies In EMR: No  Reviewed Surgeries / Procedures: No  Date of Onset of Symptoms: 11/08/2013  Guideline(s) Used:  Breathing Difficulty  Disposition Per Guideline:   Call EMS 911 Now  Reason For Disposition Reached:   Slow, shallow and weak breathing  Advice Given:  General Care Advice for Breathing Difficulty:  Find position of greatest comfort. For most patients the best position is semi-upright (e.g., sitting up in a comfortable chair or lying back against pillows).  Patient Refused Recommendation:  Patient Refused Care Advice  Pt refused to call 911 or to go to the ED as he can't afford either one.

## 2013-11-10 ENCOUNTER — Inpatient Hospital Stay: Payer: Self-pay | Admitting: Internal Medicine

## 2013-11-10 DIAGNOSIS — I4891 Unspecified atrial fibrillation: Secondary | ICD-10-CM

## 2013-11-10 DIAGNOSIS — I2589 Other forms of chronic ischemic heart disease: Secondary | ICD-10-CM

## 2013-11-10 DIAGNOSIS — I5023 Acute on chronic systolic (congestive) heart failure: Secondary | ICD-10-CM

## 2013-11-10 DIAGNOSIS — J984 Other disorders of lung: Secondary | ICD-10-CM

## 2013-11-10 LAB — COMPREHENSIVE METABOLIC PANEL
ALK PHOS: 119 U/L — AB
ALT: 28 U/L (ref 12–78)
AST: 18 U/L (ref 15–37)
Albumin: 3.6 g/dL (ref 3.4–5.0)
Anion Gap: 8 (ref 7–16)
BILIRUBIN TOTAL: 2.2 mg/dL — AB (ref 0.2–1.0)
BUN: 37 mg/dL — ABNORMAL HIGH (ref 7–18)
CHLORIDE: 108 mmol/L — AB (ref 98–107)
CO2: 22 mmol/L (ref 21–32)
Calcium, Total: 8.8 mg/dL (ref 8.5–10.1)
Creatinine: 1.28 mg/dL (ref 0.60–1.30)
EGFR (African American): >60
EGFR (Non-African Amer.): 56 — ABNORMAL LOW
Glucose: 98 mg/dL (ref 65–99)
OSMOLALITY: 284 (ref 275–301)
Potassium: 3.5 mmol/L (ref 3.5–5.1)
SODIUM: 138 mmol/L (ref 136–145)
TOTAL PROTEIN: 7.4 g/dL (ref 6.4–8.2)

## 2013-11-10 LAB — URINALYSIS, COMPLETE
BILIRUBIN, UR: NEGATIVE
Blood: NEGATIVE
GLUCOSE, UR: NEGATIVE mg/dL (ref 0–75)
Leukocyte Esterase: NEGATIVE
NITRITE: NEGATIVE
Ph: 5 (ref 4.5–8.0)
Specific Gravity: 1.018 (ref 1.003–1.030)
Squamous Epithelial: NONE SEEN
WBC UR: 3 /HPF (ref 0–5)

## 2013-11-10 LAB — CBC
HCT: 40 % (ref 40.0–52.0)
HGB: 13.1 g/dL (ref 13.0–18.0)
MCH: 30.9 pg (ref 26.0–34.0)
MCHC: 32.9 g/dL (ref 32.0–36.0)
MCV: 94 fL (ref 80–100)
PLATELETS: 153 10*3/uL (ref 150–440)
RBC: 4.26 10*6/uL — AB (ref 4.40–5.90)
RDW: 15.4 % — ABNORMAL HIGH (ref 11.5–14.5)
WBC: 6.6 10*3/uL (ref 3.8–10.6)

## 2013-11-10 LAB — PRO B NATRIURETIC PEPTIDE: B-Type Natriuretic Peptide: 4845 pg/mL — ABNORMAL HIGH (ref 0–125)

## 2013-11-10 LAB — DIGOXIN LEVEL: Digoxin: <0.06 ng/mL

## 2013-11-10 LAB — TROPONIN I
TROPONIN-I: 0.08 ng/mL — AB
TROPONIN-I: 0.12 ng/mL — AB
Troponin-I: 0.14 ng/mL — ABNORMAL HIGH

## 2013-11-10 LAB — CK TOTAL AND CKMB (NOT AT ARMC)
CK, Total: 22 U/L — ABNORMAL LOW
CK-MB: 1.8 ng/mL (ref 0.5–3.6)

## 2013-11-10 LAB — TSH: Thyroid Stimulating Horm: 5.14 u[IU]/mL — ABNORMAL HIGH

## 2013-11-10 LAB — MAGNESIUM: Magnesium: 1.7 mg/dL — ABNORMAL LOW

## 2013-11-10 LAB — T4, FREE: Free Thyroxine: 1.17 ng/dL (ref 0.76–1.46)

## 2013-11-10 NOTE — Telephone Encounter (Signed)
Was admitted to [facility] Will check on him there

## 2013-11-11 LAB — COMPREHENSIVE METABOLIC PANEL
ALBUMIN: 3.3 g/dL — AB (ref 3.4–5.0)
Alkaline Phosphatase: 111 U/L
Anion Gap: 8 (ref 7–16)
BILIRUBIN TOTAL: 1.6 mg/dL — AB (ref 0.2–1.0)
BUN: 43 mg/dL — ABNORMAL HIGH (ref 7–18)
CHLORIDE: 105 mmol/L (ref 98–107)
CO2: 21 mmol/L (ref 21–32)
Calcium, Total: 8.8 mg/dL (ref 8.5–10.1)
Creatinine: 1.65 mg/dL — ABNORMAL HIGH (ref 0.60–1.30)
EGFR (African American): 48 — ABNORMAL LOW
GFR CALC NON AF AMER: 41 — AB
GLUCOSE: 376 mg/dL — AB (ref 65–99)
OSMOLALITY: 294 (ref 275–301)
Potassium: 4.3 mmol/L (ref 3.5–5.1)
SGOT(AST): 20 U/L (ref 15–37)
SGPT (ALT): 26 U/L (ref 12–78)
SODIUM: 134 mmol/L — AB (ref 136–145)
Total Protein: 7 g/dL (ref 6.4–8.2)

## 2013-11-11 LAB — CK TOTAL AND CKMB (NOT AT ARMC)
CK, TOTAL: 41 U/L
CK-MB: 1.6 ng/mL (ref 0.5–3.6)

## 2013-11-12 LAB — BASIC METABOLIC PANEL
ANION GAP: 7 (ref 7–16)
BUN: 54 mg/dL — ABNORMAL HIGH (ref 7–18)
CALCIUM: 8.9 mg/dL (ref 8.5–10.1)
Chloride: 106 mmol/L (ref 98–107)
Co2: 23 mmol/L (ref 21–32)
Creatinine: 1.56 mg/dL — ABNORMAL HIGH (ref 0.60–1.30)
EGFR (African American): 51 — ABNORMAL LOW
GFR CALC NON AF AMER: 44 — AB
GLUCOSE: 186 mg/dL — AB (ref 65–99)
OSMOLALITY: 292 (ref 275–301)
Potassium: 4.2 mmol/L (ref 3.5–5.1)
SODIUM: 136 mmol/L (ref 136–145)

## 2013-11-12 LAB — DIGOXIN LEVEL: Digoxin: 2.5 ng/mL

## 2013-11-14 ENCOUNTER — Encounter: Payer: Self-pay | Admitting: Cardiovascular Disease

## 2013-11-16 ENCOUNTER — Encounter: Payer: Self-pay | Admitting: Internal Medicine

## 2013-11-16 ENCOUNTER — Telehealth: Payer: Self-pay

## 2013-11-16 NOTE — Telephone Encounter (Signed)
Patient contacted regarding discharge from [facility] on 11/12/13.  Patient understands to follow up with Kerin Ransom, [place] on 11/29/13 at 11:00 at [facility]. Patient understands discharge instructions? yes Patient understands medications and regiment? yes Patient understands to bring all medications to this visit? yes  Pt reports that he was d/c'd from the hospital on Sat and his pharmacy closed at noon, so he was unable to get some of his meds immediately on release.  He states that he is currently taking everything that he was discharged on, with the exception of his pain meds, as he will need a written rx for these.  Advised him to contact Dr. Silvio Pate for these.   He is agreeable.

## 2013-11-24 ENCOUNTER — Ambulatory Visit (INDEPENDENT_AMBULATORY_CARE_PROVIDER_SITE_OTHER): Payer: Medicare HMO | Admitting: Internal Medicine

## 2013-11-24 ENCOUNTER — Encounter: Payer: Medicare HMO | Admitting: Cardiology

## 2013-11-24 ENCOUNTER — Encounter: Payer: Self-pay | Admitting: Internal Medicine

## 2013-11-24 VITALS — BP 100/70 | HR 110 | Temp 97.7°F | Wt 167.0 lb

## 2013-11-24 DIAGNOSIS — R188 Other ascites: Secondary | ICD-10-CM

## 2013-11-24 DIAGNOSIS — K7460 Unspecified cirrhosis of liver: Secondary | ICD-10-CM

## 2013-11-24 DIAGNOSIS — I4891 Unspecified atrial fibrillation: Secondary | ICD-10-CM

## 2013-11-24 DIAGNOSIS — L219 Seborrheic dermatitis, unspecified: Secondary | ICD-10-CM | POA: Insufficient documentation

## 2013-11-24 DIAGNOSIS — J449 Chronic obstructive pulmonary disease, unspecified: Secondary | ICD-10-CM

## 2013-11-24 DIAGNOSIS — I5023 Acute on chronic systolic (congestive) heart failure: Secondary | ICD-10-CM

## 2013-11-24 DIAGNOSIS — I480 Paroxysmal atrial fibrillation: Secondary | ICD-10-CM

## 2013-11-24 MED ORDER — FUROSEMIDE 80 MG PO TABS
80.0000 mg | ORAL_TABLET | Freq: Every day | ORAL | Status: DC
Start: 2013-11-24 — End: 2013-11-28

## 2013-11-24 NOTE — Assessment & Plan Note (Signed)
More DOE but I think it is heart related

## 2013-11-24 NOTE — Progress Notes (Signed)
Subjective:    Patient ID: [name], male    DOB: 11/12/1940, 73 y.o.   MRN: [id number]  HPI Here with brother  Hospitalized with rapid atrial fib Now gaining weight and filling with fluid Last night-almost ready to call rescue due to the fluid Weight up to 160+ at home  Has been on [facility] home visits Off spironolactone---he thinks he may have had side effects (and was stopped in hospital) Some swelling in abdomen again  No chest pain Can feel the palpitations at times---feels fast. Usually only a few seconds Edema has returned also  Current Outpatient Prescriptions on File Prior to Visit  Medication Sig Dispense Refill  . acetaminophen (TYLENOL) 500 MG tablet Take 1,000 mg by mouth every 6 (six) hours as needed for mild pain or moderate pain.      Marland Kitchen acidophilus (RISAQUAD) CAPS capsule Take 1 capsule by mouth daily.      . Fluticasone-Salmeterol (ADVAIR) 100-50 MCG/DOSE AEPB Inhale 1 puff into the lungs 2 (two) times daily.      . furosemide (LASIX) 40 MG tablet Take 40 mg by mouth daily.      Marland Kitchen HYDROcodone-acetaminophen (NORCO/VICODIN) 5-325 MG per tablet Take 1-2 tablets by mouth every 4 (four) hours as needed for moderate pain.  15 tablet  0  . LEVEMIR 100 UNIT/ML injection Inject 15 Units into the skin at bedtime.       . metoprolol tartrate (LOPRESSOR) 25 MG tablet Take 25 mg by mouth 2 (two) times daily.      . pantoprazole (PROTONIX) 40 MG tablet Take 40 mg by mouth daily with supper.      . phenazopyridine (PYRIDIUM) 100 MG tablet Take 1 tablet (100 mg total) by mouth every 8 (eight) hours as needed for pain (Burning urination.  Will turn urine and body fluids orange.).  20 tablet  0  . senna-docusate (SENOKOT S) 8.6-50 MG per tablet Take 1 tablet by mouth 2 (two) times daily.  60 tablet  0  . TRUETEST TEST test strip        No current facility-administered medications on file prior to visit.    Allergies  Allergen Reactions  . Latex Other (See Comments)    Very  thin skin, pulls off as removing bandaids   . Adhesive [Tape]     Plastic tape Pulls skin off  . Sulfa Antibiotics     "NOT SURE-- AS A CHILD"    Past Medical History  Diagnosis Date  . Atrial fibrillation      S/P ablation  . CAD (coronary artery disease)     S/P MI 1980's----------------------Dr McDowell  . Hyperlipidemia   . CHF (congestive heart failure)   . 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 BPH (benign prostatic hypertrophy)   . Sleep disturbance   . Bladder tumor 06/2013    seen at [facility] Dr Ellin Mayhew.  no cysto or biopsy as of 06/2013.  work up delayed by cellulitis.   Marland Kitchen Cryptogenic cirrhosis   . Chronic kidney disease 08/2013    SEVERE KIDNEY FAILURE  . GI bleeding 08/2013  . Esophageal varices in cirrhosis   . Splenomegaly   . Hyperplastic colon polyp   . Nephrolithiasis   . Myocardial infarction   . Stroke     no defecits  . H/O emphysema     Past Surgical History  Procedure Laterality Date  . Coronary artery bypass graft    .  Appendectomy    . Tonsillectomy    . Upper gastrointestinal endoscopy      Gastritis , ? H. pylori 08/28/1999  . Atrial ablation surgery    . Esophagogastroduodenoscopy N/A 08/25/2013    Procedure: ESOPHAGOGASTRODUODENOSCOPY (EGD);  Surgeon: Lafayette Dragon, MD;  Location: [facility] ENDOSCOPY;  Service: Endoscopy;  Laterality: N/A;  . Cystoscopy with retrograde pyelogram, ureteroscopy and stent placement N/A 10/20/2013    Procedure: CYSTOSCOPY RETROGRADE PYELOGRAM AND BLADDER BIOPSY RECTAL EXAM UNDER ANESTHESIA;  Surgeon: Molli Hazard, MD;  Location: [facility] ORS;  Service: Urology;  Laterality: N/A;    Family History  Problem Relation Age of Onset  . Parkinson's disease Mother   . Heart disease Father   . Skin cancer Sister   . Heart attack Mother     History   Social History  . Marital Status: Single    Spouse Name: N/A    Number of Children: N/A  . Years of Education: N/A    Occupational History  . disabled due to heart disease    Social History Main Topics  . Smoking status: Former Smoker -- 1.00 packs/day    Types: Cigarettes    Quit date: 07/15/1973  . Smokeless tobacco: Never Used  . Alcohol Use: No  . Drug Use: No  . Sexual Activity: Not on file   Other Topics Concern  . Not on file   Social History Narrative   Single--lives alone   Stays with sister with Altzheimer's frequently   Has living will   Brother, Delfino Lovett has health care POA   Would accept resuscitation but no prolonged artificial ventilation   No feeding tube if cognitively unaware            Review of Systems No bleeding since last hospitalization Appetite has been poor---feels full even when not having eaten     Objective:   Physical Exam  Constitutional: He appears well-developed. No distress.  Neck: Normal range of motion. Neck supple.  Cardiovascular: Regular rhythm.  Exam reveals no gallop.   Mildly fast regular  Pulmonary/Chest: He is in respiratory distress. He has no wheezes.  Decreased breath sounds at bases and dullness  Slight basilar crackles  Musculoskeletal:  1+ non pitting edema in calves  Lymphadenopathy:    He has no cervical adenopathy.  Psychiatric: He has a normal mood and affect. His behavior is normal.          Assessment & Plan:

## 2013-11-24 NOTE — Patient Instructions (Signed)
Please take 2 of the 40mg  furosemide as soon as possible---then continue 80mg  twice a day (morning and early afternoon) till your weight at home is under 150# again. Then you can cut back to 80mg  daily.

## 2013-11-24 NOTE — Assessment & Plan Note (Signed)
May have slight return of ascites but not clear cut

## 2013-11-24 NOTE — Assessment & Plan Note (Signed)
Seems to be regular again but fast Limited by BP ---will consider increasing metoprolol at next visit if BP up after diuresis

## 2013-11-24 NOTE — Assessment & Plan Note (Signed)
On face Will try cortisone cream

## 2013-11-24 NOTE — Assessment & Plan Note (Signed)
Has fluid overload again Will increase furosemide and have early follow up

## 2013-11-25 ENCOUNTER — Other Ambulatory Visit: Payer: Self-pay | Admitting: *Deleted

## 2013-11-25 MED ORDER — FLUTICASONE-SALMETEROL 100-50 MCG/DOSE IN AEPB: 1.0000 | INHALATION_SPRAY | Freq: Two times a day (BID) | RESPIRATORY_TRACT | Status: AC

## 2013-11-28 ENCOUNTER — Ambulatory Visit (INDEPENDENT_AMBULATORY_CARE_PROVIDER_SITE_OTHER): Payer: Medicare HMO | Admitting: Internal Medicine

## 2013-11-28 ENCOUNTER — Encounter: Payer: Self-pay | Admitting: Internal Medicine

## 2013-11-28 ENCOUNTER — Ambulatory Visit: Payer: Medicare HMO | Admitting: Internal Medicine

## 2013-11-28 ENCOUNTER — Telehealth: Payer: Self-pay

## 2013-11-28 VITALS — BP 110/80 | HR 102 | Temp 97.5°F | Wt 168.0 lb

## 2013-11-28 DIAGNOSIS — R188 Other ascites: Secondary | ICD-10-CM

## 2013-11-28 DIAGNOSIS — I5023 Acute on chronic systolic (congestive) heart failure: Secondary | ICD-10-CM

## 2013-11-28 DIAGNOSIS — I4891 Unspecified atrial fibrillation: Secondary | ICD-10-CM | POA: Insufficient documentation

## 2013-11-28 DIAGNOSIS — K7460 Unspecified cirrhosis of liver: Secondary | ICD-10-CM

## 2013-11-28 MED ORDER — DIGOXIN 125 MCG PO TABS: 125.0000 ug | ORAL_TABLET | Freq: Every day | ORAL | Status: AC

## 2013-11-28 MED ORDER — METOLAZONE 2.5 MG PO TABS: 2.5000 mg | ORAL_TABLET | Freq: Every day | ORAL | Status: DC

## 2013-11-28 MED ORDER — METOPROLOL SUCCINATE ER 25 MG PO TB24: 25.0000 mg | ORAL_TABLET | Freq: Two times a day (BID) | ORAL | Status: AC

## 2013-11-28 NOTE — Progress Notes (Signed)
Subjective:    Patient ID: [name], male    DOB: 06/06/1941, 73 y.o.   MRN: [id number]  HPI Here again with brother  No better Hasn't diuresed despite the higher diuretic dosing Dyspnea has worsened---trouble sleeping at night Having "awake dreaming" state Sleeping in chair  Legs and abdomen seems bigger Some leakage from legs now  Current Outpatient Prescriptions on File Prior to Visit  Medication Sig Dispense Refill  . acetaminophen (TYLENOL) 500 MG tablet Take 1,000 mg by mouth every 6 (six) hours as needed for mild pain or moderate pain.      Marland Kitchen acidophilus (RISAQUAD) CAPS capsule Take 1 capsule by mouth daily.      . Fluticasone-Salmeterol (ADVAIR) 100-50 MCG/DOSE AEPB Inhale 1 puff into the lungs 2 (two) times daily.  60 each  1  . furosemide (LASIX) 80 MG tablet Take 1 tablet (80 mg total) by mouth daily.  60 tablet  11  . HYDROcodone-acetaminophen (NORCO/VICODIN) 5-325 MG per tablet Take 1-2 tablets by mouth every 4 (four) hours as needed for moderate pain.  15 tablet  0  . LEVEMIR 100 UNIT/ML injection Inject 15 Units into the skin at bedtime.       . metoprolol tartrate (LOPRESSOR) 25 MG tablet Take 25 mg by mouth 2 (two) times daily.      . pantoprazole (PROTONIX) 40 MG tablet Take 40 mg by mouth daily with supper.      . phenazopyridine (PYRIDIUM) 100 MG tablet Take 1 tablet (100 mg total) by mouth every 8 (eight) hours as needed for pain (Burning urination.  Will turn urine and body fluids orange.).  20 tablet  0  . senna-docusate (SENOKOT S) 8.6-50 MG per tablet Take 1 tablet by mouth 2 (two) times daily.  60 tablet  0  . TRUETEST TEST test strip        No current facility-administered medications on file prior to visit.    Allergies  Allergen Reactions  . Latex Other (See Comments)    Very thin skin, pulls off as removing bandaids   . Adhesive [Tape]     Plastic tape Pulls skin off  . Sulfa Antibiotics     "NOT SURE-- AS A CHILD"    Past Medical  History  Diagnosis Date  . Atrial fibrillation      S/P ablation  . CAD (coronary artery disease)     S/P MI 1980's----------------------Dr McDowell  . Hyperlipidemia   . CHF (congestive heart failure)   . 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 BPH (benign prostatic hypertrophy)   . Sleep disturbance   . Bladder tumor 06/2013    seen at [facility] Dr Ellin Mayhew.  no cysto or biopsy as of 06/2013.  work up delayed by cellulitis.   Marland Kitchen Cryptogenic cirrhosis   . Chronic kidney disease 08/2013    SEVERE KIDNEY FAILURE  . GI bleeding 08/2013  . Esophageal varices in cirrhosis   . Splenomegaly   . Hyperplastic colon polyp   . Nephrolithiasis   . Myocardial infarction   . Stroke     no defecits  . H/O emphysema     Past Surgical History  Procedure Laterality Date  . Coronary artery bypass graft    . Appendectomy    . Tonsillectomy    . Upper gastrointestinal endoscopy      Gastritis , ? H. pylori 08/28/1999  . Atrial ablation surgery    . Esophagogastroduodenoscopy N/A  08/25/2013    Procedure: ESOPHAGOGASTRODUODENOSCOPY (EGD);  Surgeon: Lafayette Dragon, MD;  Location: [facility] ENDOSCOPY;  Service: Endoscopy;  Laterality: N/A;  . Cystoscopy with retrograde pyelogram, ureteroscopy and stent placement N/A 10/20/2013    Procedure: CYSTOSCOPY RETROGRADE PYELOGRAM AND BLADDER BIOPSY RECTAL EXAM UNDER ANESTHESIA;  Surgeon: Molli Hazard, MD;  Location: [facility] ORS;  Service: Urology;  Laterality: N/A;    Family History  Problem Relation Age of Onset  . Parkinson's disease Mother   . Heart disease Father   . Skin cancer Sister   . Heart attack Mother     History   Social History  . Marital Status: Single    Spouse Name: N/A    Number of Children: N/A  . Years of Education: N/A   Occupational History  . disabled due to heart disease    Social History Main Topics  . Smoking status: Former Smoker -- 1.00 packs/day    Types: Cigarettes    Quit  date: 07/15/1973  . Smokeless tobacco: Never Used  . Alcohol Use: No  . Drug Use: No  . Sexual Activity: Not on file   Other Topics Concern  . Not on file   Social History Narrative   Single--lives alone   Stays with sister with Altzheimer's frequently   Has living will   Brother, [name] has health care POA   Would accept resuscitation but no prolonged artificial ventilation   No feeding tube if cognitively unaware            Review of Systems Appetite is down--but trying to eat Voids okay but only small quantities     Objective:   Physical Exam  Constitutional: He appears well-developed.  No distress but looks uncomfortable  Neck: Normal range of motion. No thyromegaly present.  Cardiovascular: Exam reveals no gallop.   Fairly regular but still rapid  Pulmonary/Chest: He has no wheezes.  Still decreased breath sounds at the bases and slight bibasilar crackles  Abdominal: He exhibits distension.  Increase in apparent ascites again  Musculoskeletal:  2+ tense edema  Lymphadenopathy:    He has no cervical adenopathy.          Assessment & Plan:

## 2013-11-28 NOTE — Patient Instructions (Signed)
Please get the 3 new meds. Start the metolazone today with your 2nd dose of the furosemide. Continue to take this daily until your weight is under 150# at home. Start the digoxin once a day Stop the metoprolol tartrate and take metoprolol succinate twice a day. This is a safer medicine for you.

## 2013-11-28 NOTE — Telephone Encounter (Signed)
Spoke w/ pt.  He states that his breathing is getting worse, worse when he lies down. Reports that he feels sick on his stomach.  Pt is sched to be seen in the office tomorrow, but states that he needs to be seen today.  Advised pt that he has an appt w/ Dr. Silvio Pate at 11:00 today. He states that he is aware of this and will keep this appt, but wanted to know if he could see Dr. Rockey Situ, as well.  Advised him to keep his appt w/ Dr. Silvio Pate, as it is in 30 mins, and if Dr. Silvio Pate needs to speak w/ Dr Rockey Situ, he can do so at that time. He is agreeable to this and will keep his appts as scheduled.

## 2013-11-28 NOTE — Assessment & Plan Note (Signed)
Increased ascites Will make med changes

## 2013-11-28 NOTE — Assessment & Plan Note (Signed)
No effect with higher furosemide Will add metolazone again Work on chronotropic insufficiency

## 2013-11-28 NOTE — Progress Notes (Signed)
Pre visit review using our clinic review tool, if applicable. No additional management support is needed unless otherwise documented below in the visit note. 

## 2013-11-28 NOTE — Assessment & Plan Note (Signed)
Will add back the digoxin but at lower dose After a week I expect to decrease to every other day  Will change metoprolol to succinate as this has proven safer in those with CHF

## 2013-11-29 ENCOUNTER — Encounter: Payer: Self-pay | Admitting: Cardiovascular Disease

## 2013-11-29 ENCOUNTER — Ambulatory Visit (INDEPENDENT_AMBULATORY_CARE_PROVIDER_SITE_OTHER): Payer: Medicare HMO | Admitting: Cardiovascular Disease

## 2013-11-29 VITALS — BP 104/80 | HR 113 | Ht 69.0 in | Wt 167.5 lb

## 2013-11-29 DIAGNOSIS — I4891 Unspecified atrial fibrillation: Secondary | ICD-10-CM

## 2013-11-29 DIAGNOSIS — R188 Other ascites: Secondary | ICD-10-CM

## 2013-11-29 DIAGNOSIS — J449 Chronic obstructive pulmonary disease, unspecified: Secondary | ICD-10-CM

## 2013-11-29 DIAGNOSIS — I5023 Acute on chronic systolic (congestive) heart failure: Secondary | ICD-10-CM

## 2013-11-29 DIAGNOSIS — K7460 Unspecified cirrhosis of liver: Secondary | ICD-10-CM

## 2013-11-29 DIAGNOSIS — I2510 Atherosclerotic heart disease of native coronary artery without angina pectoris: Secondary | ICD-10-CM

## 2013-11-29 DIAGNOSIS — R0602 Shortness of breath: Secondary | ICD-10-CM

## 2013-11-29 DIAGNOSIS — I480 Paroxysmal atrial fibrillation: Secondary | ICD-10-CM

## 2013-11-29 NOTE — Progress Notes (Signed)
Patient ID: [name], male    DOB: 03/19/1941, 73 y.o.   MRN: [id number]  HPI Comments: [name] is a pleasant 73 year old gentleman with past medical history of diabetes, coronary artery disease, bypass 1996, ischemic cardiomyopathy with ejection fraction 25-30% in September 2013, moderate to severe pulmonary hypertension at that time,  Episodes of paroxysmal atrial fibrillation with history of ablation, hyperlipidemia, obstructive sleep apnea, hypertension and COPD who presents for routine followup. 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 Inferior wall defect was seen on stress test in 2011.  Hx of nightmares which wake him up.   He has developed cirrhosis over the past several years, etiology is not clear. Currently is not followed by hepatology. He denies prior alcohol abuse Started developing significant edema and abdominal swelling with shortness of breath in 2013, managed with aggressive diuresis. He was on torsemide with good diuresis Previously he was taking torsemide 40 mg twice a day, metolazone sparingly.  On a prior visit, he was not eating as much, had thinning in his face from weight loss. Stomach becoming more distended from ascites  Previous hospital admission for hematuria. INR was 3.5. Creatinine was 2.5, BNP 1300. Hemoglobin A1c 7.1.  During the hospital course, INR improved down to 2.1, creatinine down to 1.67 with holding diuresis. Hematuria stopped. He had followup with [facility]. He reports having a CT scan done of his bladder.  Recent hospital admission 11/10/2013 with atrial fibrillation and CHF. He not been seen in followup secondary to financial issues since November 2014. Admitted to [facility] January 2015 with bright red blood per rectum secondary to gastritis. INR greater than 4. Cirrhosis by ultrasound. Amiodarone and warfarin were held. Digoxin was started in the hospital for heart rate control, 0.25 mg dosing though  the level was elevated on recheck while in the hospital and digoxin was held at discharge. Level was 2.5. BNP in the hospital was 4800. Initial creatinine 1.28, total bilirubin 2.2. He has had recent paracentesis per the patient  Since discharge he reports having continued leg edema, shortness of breath when supine, improved when sitting up weight has not changed very much. .   EKG shows atrial fibrillation with rate 113 beats per minute, left bundle branch block    Outpatient Encounter Prescriptions as of 11/29/2013  Medication Sig  . acetaminophen (TYLENOL) 500 MG tablet Take 1,000 mg by mouth every 6 (six) hours as needed for mild pain or moderate pain.  Marland Kitchen acidophilus (RISAQUAD) CAPS capsule Take 1 capsule by mouth daily.  . digoxin (LANOXIN) 0.125 MG tablet Take 1 tablet (125 mcg total) by mouth daily.  . Fluticasone-Salmeterol (ADVAIR) 100-50 MCG/DOSE AEPB Inhale 1 puff into the lungs 2 (two) times daily.  . furosemide (LASIX) 80 MG tablet Take 80 mg by mouth 2 (two) times daily.  Marland Kitchen HYDROcodone-acetaminophen (NORCO/VICODIN) 5-325 MG per tablet Take 1-2 tablets by mouth every 4 (four) hours as needed for moderate pain.  Marland Kitchen LEVEMIR 100 UNIT/ML injection Inject 15 Units into the skin at bedtime.   . metolazone (ZAROXOLYN) 2.5 MG tablet Take 1 tablet (2.5 mg total) by mouth daily.  . metoprolol succinate (TOPROL-XL) 25 MG 24 hr tablet Take 1 tablet (25 mg total) by mouth 2 (two) times daily.  Marland Kitchen oxycodone (OXY-IR) 5 MG capsule Take 5 mg by mouth every 6 (six) hours as needed.  . pantoprazole (PROTONIX) 40 MG tablet Take 40 mg by mouth daily with supper.  Marland Kitchen  phenazopyridine (PYRIDIUM) 100 MG tablet Take 1 tablet (100 mg total) by mouth every 8 (eight) hours as needed for pain (Burning urination.  Will turn urine and body fluids orange.).  Marland Kitchen senna-docusate (SENOKOT S) 8.6-50 MG per tablet Take 1 tablet by mouth 2 (two) times daily.  . TRUETEST TEST test strip     Review of Systems   Constitutional: Negative.   HENT: Negative.   Eyes: Negative.   Respiratory: Positive for shortness of breath.   Cardiovascular: Positive for leg swelling.  Gastrointestinal: Negative.        Abdominal swelling  Endocrine: Negative.   Skin: Negative.   Allergic/Immunologic: Negative.   Neurological: Negative.   Hematological: Negative.   Psychiatric/Behavioral: Negative.   All other systems reviewed and are negative.   BP 104/80  Pulse 113  Ht 5\' 9"  (1.753 m)  Wt 167 lb 8 oz (75.978 kg)  BMI 24.72 kg/m2  Physical Exam  Nursing note and vitals reviewed. Constitutional: He is oriented to person, place, and time. He appears well-developed and well-nourished.  HENT:  Head: Normocephalic.  Nose: Nose normal.  Mouth/Throat: Oropharynx is clear and moist.  Eyes: Conjunctivae are normal. Pupils are equal, round, and reactive to light.  Neck: Normal range of motion. Neck supple. No JVD present.  Cardiovascular: Normal rate, regular rhythm, S1 normal, S2 normal and intact distal pulses.  Exam reveals no gallop and no friction rub.   Murmur heard.  Crescendo systolic murmur is present with a grade of 2/6  Trace  pitting edema bilaterally below the knees  Pulmonary/Chest: Effort normal. No respiratory distress. He has decreased breath sounds in the right lower field and the left lower field. He has no wheezes. He has no rales. He exhibits no tenderness.  Abdominal: Soft. Bowel sounds are normal. He exhibits no distension. There is no tenderness.  Musculoskeletal: Normal range of motion. He exhibits no edema and no tenderness.  Lymphadenopathy:    He has no cervical adenopathy.  Neurological: He is alert and oriented to person, place, and time. Coordination normal.  Skin: Skin is warm and dry. No rash noted. No erythema.  Psychiatric: He has a normal mood and affect. His behavior is normal. Judgment and thought content normal.      Assessment and Plan

## 2013-11-29 NOTE — Assessment & Plan Note (Signed)
On presentation today, he reports having stable though persistent ascites. Not tense requiring paracentesis. Metolazone recently started in addition to Lasix.

## 2013-11-29 NOTE — Assessment & Plan Note (Signed)
Underlying lung disease likely contributing to shortness of breath, exacerbated by systolic heart failure. We'll probably need to maintain a prerenal state with aggressive diuresis for symptom control

## 2013-11-29 NOTE — Assessment & Plan Note (Signed)
Currently with no symptoms of angina. No further workup at this time. Continue current medication regimen. 

## 2013-11-29 NOTE — Assessment & Plan Note (Signed)
Now with persistent atrial fibrillation. Rate continues to be mildly elevated . Digoxin recently added. Recommended to him to stay on the digoxin, he has followup with Dr.  Silvio Pate in one week time. Could follow a digoxin level periodically. Few other choices for heart rate control given his hypotension

## 2013-11-29 NOTE — Assessment & Plan Note (Signed)
Chronic systolic and diastolic CHF, pulmonary hypertension. Currently on Lasix with metolazone. If no improvement in his leg edema, could change back to torsemide which she was taking previously, 40 mg twice a day with metolazone when necessary.

## 2013-11-29 NOTE — Patient Instructions (Addendum)
You are doing well. No medication changes were made.  Please call us if you have new issues that need to be addressed before your next appt.  Your physician wants you to follow-up in: 1 month.  

## 2013-12-05 ENCOUNTER — Ambulatory Visit (INDEPENDENT_AMBULATORY_CARE_PROVIDER_SITE_OTHER): Payer: Commercial Managed Care - HMO | Admitting: Internal Medicine

## 2013-12-05 ENCOUNTER — Encounter: Payer: Self-pay | Admitting: Internal Medicine

## 2013-12-05 VITALS — BP 120/70 | HR 118 | Temp 98.6°F | Wt 146.0 lb

## 2013-12-05 DIAGNOSIS — E441 Mild protein-calorie malnutrition: Secondary | ICD-10-CM | POA: Insufficient documentation

## 2013-12-05 DIAGNOSIS — I5023 Acute on chronic systolic (congestive) heart failure: Secondary | ICD-10-CM

## 2013-12-05 DIAGNOSIS — R188 Other ascites: Secondary | ICD-10-CM

## 2013-12-05 DIAGNOSIS — K7460 Unspecified cirrhosis of liver: Secondary | ICD-10-CM

## 2013-12-05 DIAGNOSIS — I4891 Unspecified atrial fibrillation: Secondary | ICD-10-CM

## 2013-12-05 NOTE — Assessment & Plan Note (Signed)
Better with the diuresis

## 2013-12-05 NOTE — Progress Notes (Signed)
Pre visit review using our clinic review tool, if applicable. No additional management support is needed unless otherwise documented below in the visit note. 

## 2013-12-05 NOTE — Progress Notes (Signed)
Subjective:    Patient ID: [name], male    DOB: 03/24/1941, 73 y.o.   MRN: [id number]  HPI Doing much better Slow weight loss accelerated and now lost a lot in the past couple of days  DOE has improved Still will give out when going to grocery store  Ascites seems to have gone down some as well Appetite is okay--not really hungry but able to eat  Stopped the metolazone when his weight got to 150# Still on the bid furosemide 80mg   Gets mild orthostatic dizziness  Current Outpatient Prescriptions on File Prior to Visit  Medication Sig Dispense Refill  . acetaminophen (TYLENOL) 500 MG tablet Take 1,000 mg by mouth every 6 (six) hours as needed for mild pain or moderate pain.      Marland Kitchen acidophilus (RISAQUAD) CAPS capsule Take 1 capsule by mouth daily.      . digoxin (LANOXIN) 0.125 MG tablet Take 1 tablet (125 mcg total) by mouth daily.  30 tablet  11  . Fluticasone-Salmeterol (ADVAIR) 100-50 MCG/DOSE AEPB Inhale 1 puff into the lungs 2 (two) times daily.  60 each  1  . furosemide (LASIX) 80 MG tablet Take 80 mg by mouth 2 (two) times daily.      Marland Kitchen HYDROcodone-acetaminophen (NORCO/VICODIN) 5-325 MG per tablet Take 1-2 tablets by mouth every 4 (four) hours as needed for moderate pain.  15 tablet  0  . LEVEMIR 100 UNIT/ML injection Inject 15 Units into the skin at bedtime.       . metolazone (ZAROXOLYN) 2.5 MG tablet Take 1 tablet (2.5 mg total) by mouth daily.  30 tablet  1  . metoprolol succinate (TOPROL-XL) 25 MG 24 hr tablet Take 1 tablet (25 mg total) by mouth 2 (two) times daily.  60 tablet  11  . oxycodone (OXY-IR) 5 MG capsule Take 5 mg by mouth every 6 (six) hours as needed.      . pantoprazole (PROTONIX) 40 MG tablet Take 40 mg by mouth daily with supper.      . phenazopyridine (PYRIDIUM) 100 MG tablet Take 1 tablet (100 mg total) by mouth every 8 (eight) hours as needed for pain (Burning urination.  Will turn urine and body fluids orange.).  20 tablet  0  .  senna-docusate (SENOKOT S) 8.6-50 MG per tablet Take 1 tablet by mouth 2 (two) times daily.  60 tablet  0  . TRUETEST TEST test strip        No current facility-administered medications on file prior to visit.    Allergies  Allergen Reactions  . Latex Other (See Comments)    Very thin skin, pulls off as removing bandaids   . Adhesive [Tape]     Plastic tape Pulls skin off  . Sulfa Antibiotics     "NOT SURE-- AS A CHILD"    Past Medical History  Diagnosis Date  . Atrial fibrillation      S/P ablation  . CAD (coronary artery disease)     S/P MI 1980's----------------------Dr McDowell  . Hyperlipidemia   . CHF (congestive heart failure)   . 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 BPH (benign prostatic hypertrophy)   . Sleep disturbance   . Bladder tumor 06/2013    seen at [facility] Dr Ellin Mayhew.  no cysto or biopsy as of 06/2013.  work up delayed by cellulitis.   Marland Kitchen Cryptogenic cirrhosis   . Chronic kidney disease 08/2013    SEVERE  KIDNEY FAILURE  . GI bleeding 08/2013  . Esophageal varices in cirrhosis   . Splenomegaly   . Hyperplastic colon polyp   . Nephrolithiasis   . Myocardial infarction   . Stroke     no defecits  . H/O emphysema     Past Surgical History  Procedure Laterality Date  . Coronary artery bypass graft    . Appendectomy    . Tonsillectomy    . Upper gastrointestinal endoscopy      Gastritis , ? H. pylori 08/28/1999  . Atrial ablation surgery    . Esophagogastroduodenoscopy N/A 08/25/2013    Procedure: ESOPHAGOGASTRODUODENOSCOPY (EGD);  Surgeon: Lafayette Dragon, MD;  Location: [facility] ENDOSCOPY;  Service: Endoscopy;  Laterality: N/A;  . Cystoscopy with retrograde pyelogram, ureteroscopy and stent placement N/A 10/20/2013    Procedure: CYSTOSCOPY RETROGRADE PYELOGRAM AND BLADDER BIOPSY RECTAL EXAM UNDER ANESTHESIA;  Surgeon: Molli Hazard, MD;  Location: [facility] ORS;  Service: Urology;  Laterality: N/A;    Family  History  Problem Relation Age of Onset  . Parkinson's disease Mother   . Heart disease Father   . Skin cancer Sister   . Heart attack Mother     History   Social History  . Marital Status: Single    Spouse Name: N/A    Number of Children: N/A  . Years of Education: N/A   Occupational History  . disabled due to heart disease    Social History Main Topics  . Smoking status: Former Smoker -- 1.00 packs/day    Types: Cigarettes    Quit date: 07/15/1973  . Smokeless tobacco: Never Used  . Alcohol Use: No  . Drug Use: No  . Sexual Activity: Not on file   Other Topics Concern  . Not on file   Social History Narrative   Single--lives alone   Stays with sister with Altzheimer's frequently   Has living will   Brother, Delfino Lovett has health care POA   Would accept resuscitation but no prolonged artificial ventilation   No feeding tube if cognitively unaware            Review of Systems Now can sleep in bed lying down--- some very mild orthopnea still Occasional chest "throbs"---usually just at rest    Objective:   Physical Exam  Constitutional: He appears well-developed. No distress.  Neck: Normal range of motion.  Cardiovascular: Exam reveals no gallop.   No murmur heard. Fast still, slightly irregular  Pulmonary/Chest: Effort normal and breath sounds normal. No respiratory distress. He has no wheezes.  Abdominal:  Only slight distention but slight tenderness  Musculoskeletal:  Trace edema with slight tenderness in right calf  Lymphadenopathy:    He has no cervical adenopathy.          Assessment & Plan:

## 2013-12-05 NOTE — Assessment & Plan Note (Signed)
Has some facial and extremity wasting Hard to judge by weight Discussed some increase in protein (need to be careful with liver and kidney disease) Adequate calories

## 2013-12-05 NOTE — Assessment & Plan Note (Addendum)
Still rapid  May want to increase the metoprolol as he may have better cardiac function with better chronotropic control--but I am concerned about side effects. Will not make a change for now If not lower in 2 weeks, will increase Check dig level--doubt I can push this dose at all

## 2013-12-05 NOTE — Assessment & Plan Note (Signed)
Much better Finally able to diurese Off the metolazone again for now

## 2013-12-06 ENCOUNTER — Other Ambulatory Visit: Payer: Self-pay | Admitting: *Deleted

## 2013-12-06 MED ORDER — METOLAZONE 2.5 MG PO TABS: 2.5000 mg | ORAL_TABLET | Freq: Every day | ORAL | Status: DC

## 2013-12-07 ENCOUNTER — Other Ambulatory Visit (INDEPENDENT_AMBULATORY_CARE_PROVIDER_SITE_OTHER): Payer: Commercial Managed Care - HMO

## 2013-12-08 LAB — BASIC METABOLIC PANEL
BUN: 42 mg/dL — ABNORMAL HIGH (ref 6–23)
CO2: 36 mEq/L — ABNORMAL HIGH (ref 19–32)
CREATININE: 1.2 mg/dL (ref 0.4–1.5)
Calcium: 9.7 mg/dL (ref 8.4–10.5)
Chloride: 92 mEq/L — ABNORMAL LOW (ref 96–112)
GFR: 63.88 mL/min (ref >60.00)
GLUCOSE: 253 mg/dL — AB (ref 70–99)
POTASSIUM: 3.3 meq/L — AB (ref 3.5–5.1)
Sodium: 137 mEq/L (ref 135–145)

## 2013-12-09 ENCOUNTER — Telehealth: Payer: Self-pay | Admitting: *Deleted

## 2013-12-09 LAB — DIGOXIN LEVEL: Digoxin Level: 1.2 ng/mL (ref 0.8–2.0)

## 2013-12-09 MED ORDER — POTASSIUM CHLORIDE ER 10 MEQ PO TBCR: 10.0000 meq | EXTENDED_RELEASE_TABLET | Freq: Every day | ORAL | Status: DC

## 2013-12-09 NOTE — Telephone Encounter (Signed)
rx sent to pharmacy by e-script Spoke with patient and advised results   

## 2013-12-09 NOTE — Telephone Encounter (Signed)
Message copied by Despina Hidden on Fri Dec 09, 2013  2:45 PM ------      Message from: [name] I      Created: Fri Dec 09, 2013  7:06 AM       Please call him      The kidney tests are okay---though definitely show the signs of all the fluid taken off      His potassium is a little low again      Have him restart KCl 23meq daily (he has had this in the past---send Rx for 1 year if he needs)      We will just recheck labs at his next visit ------

## 2013-12-12 ENCOUNTER — Ambulatory Visit: Payer: Medicare HMO | Admitting: Internal Medicine

## 2013-12-19 ENCOUNTER — Ambulatory Visit (INDEPENDENT_AMBULATORY_CARE_PROVIDER_SITE_OTHER): Payer: Commercial Managed Care - HMO | Admitting: Internal Medicine

## 2013-12-19 ENCOUNTER — Encounter: Payer: Self-pay | Admitting: Internal Medicine

## 2013-12-19 VITALS — BP 110/70 | HR 103 | Temp 97.8°F | Wt 142.0 lb

## 2013-12-19 DIAGNOSIS — I4891 Unspecified atrial fibrillation: Secondary | ICD-10-CM

## 2013-12-19 DIAGNOSIS — E1159 Type 2 diabetes mellitus with other circulatory complications: Secondary | ICD-10-CM

## 2013-12-19 DIAGNOSIS — I5022 Chronic systolic (congestive) heart failure: Secondary | ICD-10-CM

## 2013-12-19 NOTE — Progress Notes (Signed)
Subjective:    Patient ID: [name], male    DOB: [birth year], 73 y.o.   MRN: [id number]  HPI Feeling better Weight is down 4# more  Breathing is still short--with any activity.  Has to rest after 5-10 minutes Has to use wheelchair to go to supermarket, etc  Taking the furosemide twice a day No metolazone Not below 140# at home yet (threshold for holding second furosemide)  Occasional palpitation No dizziness or syncope Does have some balance problems---especially the right leg giving way  Sugars have been "running high" As high as 400 in middle of day AM usually 90-110  No hypoglycemic reactions  Current Outpatient Prescriptions on File Prior to Visit  Medication Sig Dispense Refill  . acetaminophen (TYLENOL) 500 MG tablet Take 1,000 mg by mouth every 6 (six) hours as needed for mild pain or moderate pain.      Marland Kitchen digoxin (LANOXIN) 0.125 MG tablet Take 1 tablet (125 mcg total) by mouth daily.  30 tablet  11  . Fluticasone-Salmeterol (ADVAIR) 100-50 MCG/DOSE AEPB Inhale 1 puff into the lungs 2 (two) times daily.  60 each  1  . furosemide (LASIX) 80 MG tablet Take 80 mg by mouth 2 (two) times daily. Hold afternoon dose if home weight under 140#      . LEVEMIR 100 UNIT/ML injection Inject 15 Units into the skin at bedtime.       . metoprolol succinate (TOPROL-XL) 25 MG 24 hr tablet Take 1 tablet (25 mg total) by mouth 2 (two) times daily.  60 tablet  11  . oxycodone (OXY-IR) 5 MG capsule Take 5 mg by mouth every 6 (six) hours as needed.      . pantoprazole (PROTONIX) 40 MG tablet Take 40 mg by mouth daily with supper.      . phenazopyridine (PYRIDIUM) 100 MG tablet Take 1 tablet (100 mg total) by mouth every 8 (eight) hours as needed for pain (Burning urination.  Will turn urine and body fluids orange.).  20 tablet  0  . senna-docusate (SENOKOT S) 8.6-50 MG per tablet Take 1 tablet by mouth 2 (two) times daily.  60 tablet  0  . TRUETEST TEST test strip        No current  facility-administered medications on file prior to visit.    Allergies  Allergen Reactions  . Latex Other (See Comments)    Very thin skin, pulls off as removing bandaids   . Adhesive [Tape]     Plastic tape Pulls skin off  . Sulfa Antibiotics     "NOT SURE-- AS A CHILD"    Past Medical History  Diagnosis Date  . Atrial fibrillation      S/P ablation  . CAD (coronary artery disease)     S/P MI 1980's----------------------Dr McDowell  . Hyperlipidemia   . CHF (congestive heart failure)   . 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 BPH (benign prostatic hypertrophy)   . Sleep disturbance   . Bladder tumor 06/2013    seen at [facility] Dr Ellin Mayhew.  no cysto or biopsy as of 06/2013.  work up delayed by cellulitis.   Marland Kitchen Cryptogenic cirrhosis   . Chronic kidney disease 08/2013    SEVERE KIDNEY FAILURE  . GI bleeding 08/2013  . Esophageal varices in cirrhosis   . Splenomegaly   . Hyperplastic colon polyp   . Nephrolithiasis   . Myocardial infarction   . Stroke  no defecits  . H/O emphysema     Past Surgical History  Procedure Laterality Date  . Coronary artery bypass graft    . Appendectomy    . Tonsillectomy    . Upper gastrointestinal endoscopy      Gastritis , ? H. pylori 08/28/1999  . Atrial ablation surgery    . Esophagogastroduodenoscopy N/A 08/25/2013    Procedure: ESOPHAGOGASTRODUODENOSCOPY (EGD);  Surgeon: Lafayette Dragon, MD;  Location: [facility] ENDOSCOPY;  Service: Endoscopy;  Laterality: N/A;  . Cystoscopy with retrograde pyelogram, ureteroscopy and stent placement N/A 10/20/2013    Procedure: CYSTOSCOPY RETROGRADE PYELOGRAM AND BLADDER BIOPSY RECTAL EXAM UNDER ANESTHESIA;  Surgeon: Molli Hazard, MD;  Location: [facility] ORS;  Service: Urology;  Laterality: N/A;    Family History  Problem Relation Age of Onset  . Parkinson's disease Mother   . Heart disease Father   . Skin cancer Sister   . Heart attack Mother     History     Social History  . Marital Status: Single    Spouse Name: N/A    Number of Children: N/A  . Years of Education: N/A   Occupational History  . disabled due to heart disease    Social History Main Topics  . Smoking status: Former Smoker -- 1.00 packs/day    Types: Cigarettes    Quit date: 07/15/1973  . Smokeless tobacco: Never Used  . Alcohol Use: No  . Drug Use: No  . Sexual Activity: Not on file   Other Topics Concern  . Not on file   Social History Narrative   Single--lives alone   Stays with sister with Altzheimer's frequently   Has living will   Brother, Delfino Lovett has health care POA   Would accept resuscitation but no prolonged artificial ventilation   No feeding tube if cognitively unaware            Review of Systems Still not sleeping well. Sleeps in reclining office chair but sometimes in bed now. Has raised HOB with bricks and lies on big cushion. Not really having PND with this Appetite is good     Objective:   Physical Exam  Constitutional: He appears well-developed and well-nourished. No distress.  Neck: Normal range of motion. Neck supple.  Cardiovascular: Normal rate and normal heart sounds.  Exam reveals no gallop.   No murmur heard. Irregular In the 90's!!!  Pulmonary/Chest: Effort normal and breath sounds normal. No respiratory distress. He has no wheezes. He has no rales.  Abdominal: Soft. There is no tenderness. There is no rebound and no guarding.  Musculoskeletal: He exhibits no edema.  Lymphadenopathy:    He has no cervical adenopathy.  Psychiatric: He has a normal mood and affect. His behavior is normal.          Assessment & Plan:

## 2013-12-19 NOTE — Assessment & Plan Note (Signed)
Better Weight is finally down Will continue current diuretic dosing

## 2013-12-19 NOTE — Assessment & Plan Note (Addendum)
Rate is finally under 100 Dig level is 1.2--certainly can't increase this Will continue same metoprolol for now Will restart low dose aspirin since liver seems better

## 2013-12-19 NOTE — Assessment & Plan Note (Signed)
Very labile Low in AM (but no hypoglycemia) and 400 post prandial Biggest concern is hypoglycemia Overall good control  No change for now  Lab Results  Component Value Date   HGBA1C 6.8* 10/03/2013

## 2013-12-30 ENCOUNTER — Encounter: Payer: Self-pay | Admitting: Cardiovascular Disease

## 2013-12-30 ENCOUNTER — Ambulatory Visit (INDEPENDENT_AMBULATORY_CARE_PROVIDER_SITE_OTHER): Payer: Medicare HMO | Admitting: Cardiovascular Disease

## 2013-12-30 VITALS — BP 80/50 | HR 87 | Ht 69.0 in | Wt 143.8 lb

## 2013-12-30 DIAGNOSIS — J449 Chronic obstructive pulmonary disease, unspecified: Secondary | ICD-10-CM

## 2013-12-30 DIAGNOSIS — I4891 Unspecified atrial fibrillation: Secondary | ICD-10-CM

## 2013-12-30 DIAGNOSIS — I5023 Acute on chronic systolic (congestive) heart failure: Secondary | ICD-10-CM

## 2013-12-30 DIAGNOSIS — I2510 Atherosclerotic heart disease of native coronary artery without angina pectoris: Secondary | ICD-10-CM

## 2013-12-30 DIAGNOSIS — R079 Chest pain, unspecified: Secondary | ICD-10-CM

## 2013-12-30 DIAGNOSIS — I951 Orthostatic hypotension: Secondary | ICD-10-CM

## 2013-12-30 DIAGNOSIS — R0602 Shortness of breath: Secondary | ICD-10-CM

## 2013-12-30 NOTE — Assessment & Plan Note (Signed)
Suspect his cardiac issues as well as underlying lung issues are both contributing to his chronic moderate shortness of breath.

## 2013-12-30 NOTE — Assessment & Plan Note (Signed)
Dramatically improved fluid status over the past 2 months with 20 pound weight loss. Would agree with holding one of his diuretics for weight less than 140 pounds. Recent lab work suggesting he is mildly dry but overall has much improved symptoms. Complemented him on how well he is looking on today's visit.

## 2013-12-30 NOTE — Patient Instructions (Signed)
You are doing well. No medication changes were made.  Please call us if you have new issues that need to be addressed before your next appt.  Your physician wants you to follow-up in: 6 months.  You will receive a reminder letter in the mail two months in advance. If you don't receive a letter, please call our office to schedule the follow-up appointment.   

## 2013-12-30 NOTE — Progress Notes (Signed)
Patient ID: [name], male    DOB: 04/20/1941, 73 y.o.   MRN: [id number]  HPI Comments: [name] is a pleasant 73 year old gentleman with past medical history of diabetes, coronary artery disease, bypass 1996, ischemic cardiomyopathy with ejection fraction 25-30% in September 2013, moderate to severe pulmonary hypertension at that time,  Episodes of paroxysmal atrial fibrillation with history of ablation, hyperlipidemia, obstructive sleep apnea, hypertension and COPD who presents for routine followup.  inferior wall ischemia from previous infarct more than 10 years ago. Inferior wall defect was seen on stress test in 2011.  Hx of nightmares which wake him up.   developed cirrhosis over the past several years, etiology is not clear.  He denies prior alcohol abuse. Previous paracentesis Previous history of hematuria, also history of bright red blood per rectum secondary to gastritis January 2015  Started developing significant edema and abdominal swelling with shortness of breath in 2013, managed with aggressive diuresis.  Previously taking torsemide 40 mg twice a day, metolazone sparingly. On a prior visit, he was not eating as much, had thinning in his face from weight loss. Stomach becoming more distended from ascites  In followup today, he reports that he is doing well. His weight is down approximately 20 pounds or more from 167 pounds now 143 pounds. He is been taking Lasix 80 mg twice a day. He is active but has chronic shortness of breath with exertion. Denies any significant PND or orthopnea. Reports that he is eating better  Previous hospital admission for hematuria. INR was 3.5. Creatinine was 2.5, BNP 1300. Hemoglobin A1c 7.1.  During the hospital course, INR improved down to 2.1, creatinine down to 1.67 with holding diuresis. Hematuria stopped. He had followup with [facility]. He reports having a CT scan done of his bladder.  Recent hospital admission 11/10/2013 with  atrial fibrillation and CHF.  EKG shows atrial flutter with ventricular rate 87 beats per minute intraventricular conduction delay, left anterior fascicular block    Outpatient Encounter Prescriptions as of 12/30/2013  Medication Sig  . acetaminophen (TYLENOL) 500 MG tablet Take 1,000 mg by mouth every 6 (six) hours as needed for mild pain or moderate pain.  Marland Kitchen aspirin 81 MG tablet Take 81 mg by mouth every other day.  . digoxin (LANOXIN) 0.125 MG tablet Take 1 tablet (125 mcg total) by mouth daily.  . Fluticasone-Salmeterol (ADVAIR) 100-50 MCG/DOSE AEPB Inhale 1 puff into the lungs 2 (two) times daily.  . furosemide (LASIX) 80 MG tablet Take 80 mg by mouth 2 (two) times daily. Hold afternoon dose if home weight under 140#  . LEVEMIR 100 UNIT/ML injection Inject 15 Units into the skin at bedtime.   . metolazone (ZAROXOLYN) 2.5 MG tablet Take 2.5 mg by mouth daily. Hold if weight under 150#  . Multiple Vitamin (MULTIVITAMIN) tablet Take 1 tablet by mouth daily.  Marland Kitchen oxycodone (OXY-IR) 5 MG capsule Take 5 mg by mouth every 6 (six) hours as needed.  . pantoprazole (PROTONIX) 40 MG tablet Take 40 mg by mouth daily with supper.  . phenazopyridine (PYRIDIUM) 100 MG tablet Take 1 tablet (100 mg total) by mouth every 8 (eight) hours as needed for pain (Burning urination.  Will turn urine and body fluids orange.).  Marland Kitchen senna-docusate (SENOKOT S) 8.6-50 MG per tablet Take 1 tablet by mouth 2 (two) times daily.  . TRUETEST TEST test strip   . metoprolol succinate (TOPROL-XL) 25 MG 24 hr tablet Take 1 tablet (25 mg total)  by mouth 2 (two) times daily.     Review of Systems  Constitutional: Negative.   HENT: Negative.   Eyes: Negative.   Respiratory: Positive for shortness of breath.   Cardiovascular: Negative.   Gastrointestinal: Negative.   Endocrine: Negative.   Musculoskeletal: Negative.   Skin: Negative.   Allergic/Immunologic: Negative.   Neurological: Negative.   Hematological: Negative.    Psychiatric/Behavioral: Negative.   All other systems reviewed and are negative.   BP 80/50  Pulse 87  Ht 5\' 9"  (1.753 m)  Wt 143 lb 12 oz (65.205 kg)  BMI 21.22 kg/m2  Physical Exam  Nursing note and vitals reviewed. Constitutional: He is oriented to person, place, and time.  Thin arms and face  HENT:  Head: Normocephalic.  Nose: Nose normal.  Mouth/Throat: Oropharynx is clear and moist.  Eyes: Conjunctivae are normal. Pupils are equal, round, and reactive to light.  Neck: Normal range of motion. Neck supple. No JVD present.  Cardiovascular: Normal rate, S1 normal, S2 normal and intact distal pulses.  An irregularly irregular rhythm present. Exam reveals no gallop and no friction rub.   Murmur heard.  Crescendo systolic murmur is present with a grade of 2/6  Pulmonary/Chest: Effort normal. No respiratory distress. He has decreased breath sounds in the right lower field and the left lower field. He has no wheezes. He has no rales. He exhibits no tenderness.  Abdominal: Soft. Bowel sounds are normal. He exhibits no distension. There is no tenderness.  Musculoskeletal: Normal range of motion. He exhibits no edema and no tenderness.  Lymphadenopathy:    He has no cervical adenopathy.  Neurological: He is alert and oriented to person, place, and time. Coordination normal.  Skin: Skin is warm and dry. No rash noted. No erythema.  Psychiatric: He has a normal mood and affect. His behavior is normal. Judgment and thought content normal.      Assessment and Plan

## 2013-12-30 NOTE — Assessment & Plan Note (Signed)
Blood pressure is low on today's visit. He is asymptomatic. Recent blood pressure at home and on prior office visits has been 749 systolic or higher. No medication changes made on today's visit. Recommended he hold his Lasix for dizziness and drink fluids

## 2013-12-30 NOTE — Assessment & Plan Note (Signed)
Appears to be in atrial flutter controlled ventricular rate. Not a good candidate for anticoagulation given prior hematuria and GI bleed. This was discussed with him.

## 2013-12-30 NOTE — Assessment & Plan Note (Signed)
Currently with no symptoms of angina. No further workup at this time. Continue current medication regimen. 

## 2014-01-05 ENCOUNTER — Telehealth: Payer: Self-pay

## 2014-01-05 NOTE — Telephone Encounter (Signed)
He should be taking furosemide 80mg  twice a day. If his weight at home is 140# or less, he should only take it once a day

## 2014-01-05 NOTE — Telephone Encounter (Signed)
Pt left v/m requesting how much fluid tabs pt is to be taking? Pt request cb. Pt saw Dr Rockey Situ on 12/30/13 and his note agreed to hold one of pts diuretics for wt less than 140 lbs. Dr Rockey Situ also noted recent lab suggest pt is mildly dry but overall has much improved. Please advise.

## 2014-01-06 ENCOUNTER — Other Ambulatory Visit: Payer: Self-pay | Admitting: *Deleted

## 2014-01-06 MED ORDER — GLUCOSE BLOOD VI STRP: ORAL_STRIP | Status: AC

## 2014-01-06 MED ORDER — FUROSEMIDE 80 MG PO TABS: 80.0000 mg | ORAL_TABLET | Freq: Two times a day (BID) | ORAL | Status: AC

## 2014-01-06 NOTE — Telephone Encounter (Signed)
rx sent to pharmacy by e-script Spoke with patient and advised results   

## 2014-01-12 ENCOUNTER — Telehealth: Payer: Self-pay | Admitting: Family Medicine

## 2014-02-01 NOTE — Telephone Encounter (Signed)
Officer Rosana Hoes called and said patient was found by his sister this morning unconscious.  Patient passed away at the home and Officer Rosana Hoes needed to know who would sign the death certificate.  Dr.Letvak and Dr.Gollan are on vacation and Dr.Aron agreed to sign the death certificate, if Dr.Gollan was unable to sign.  Officer Rosana Hoes was told Dr. Deborra Medina will sign the death certificate.

## 2014-02-01 DEATH — deceased

## 2014-03-08 IMAGING — CR DG CHEST 2V
2 series · 2 of 2 positions shown · non-contrast
Comparison: 08/25/2013

CLINICAL DATA: Short of breath.

EXAM:
CHEST  2 VIEW

[w chest pa]
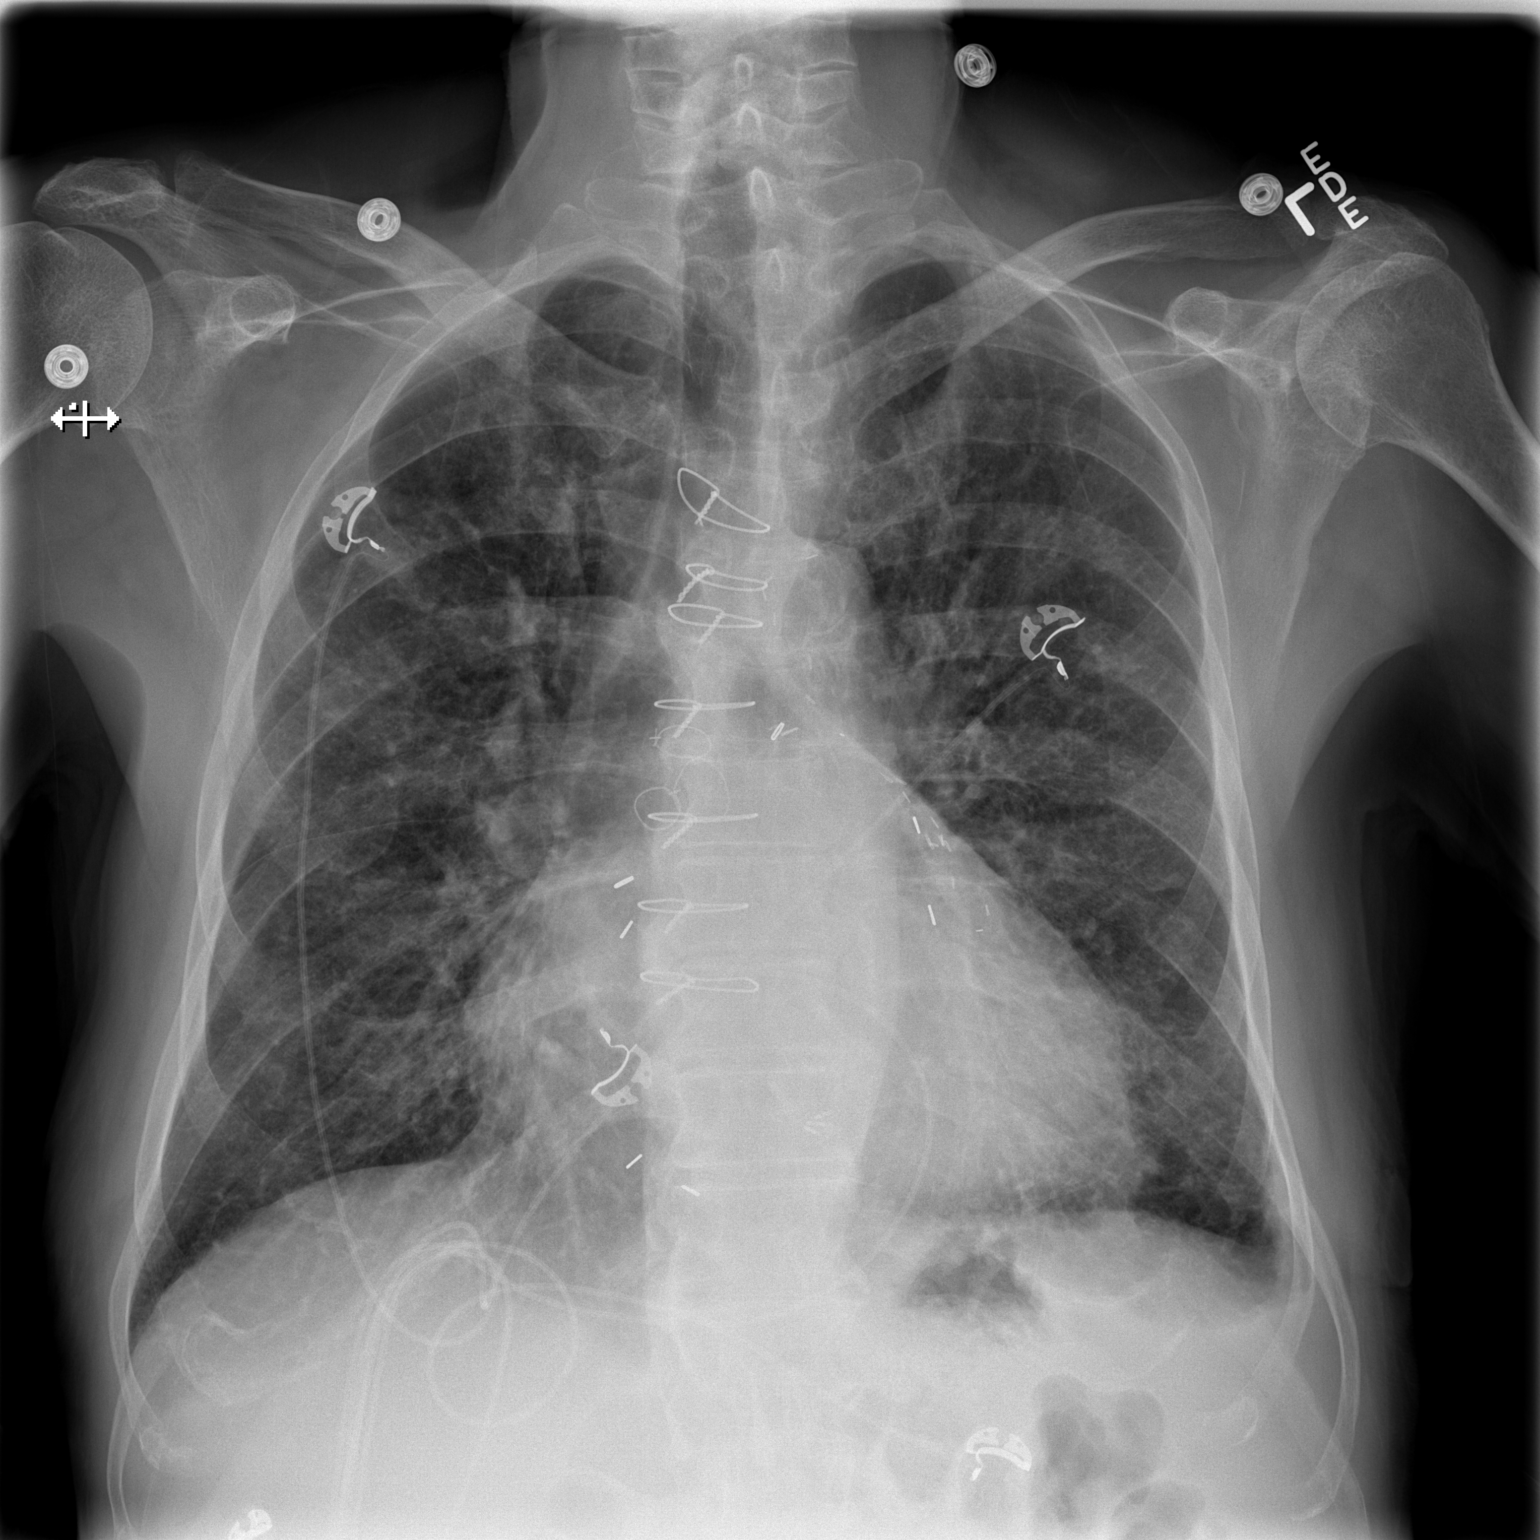

[w chest lat]
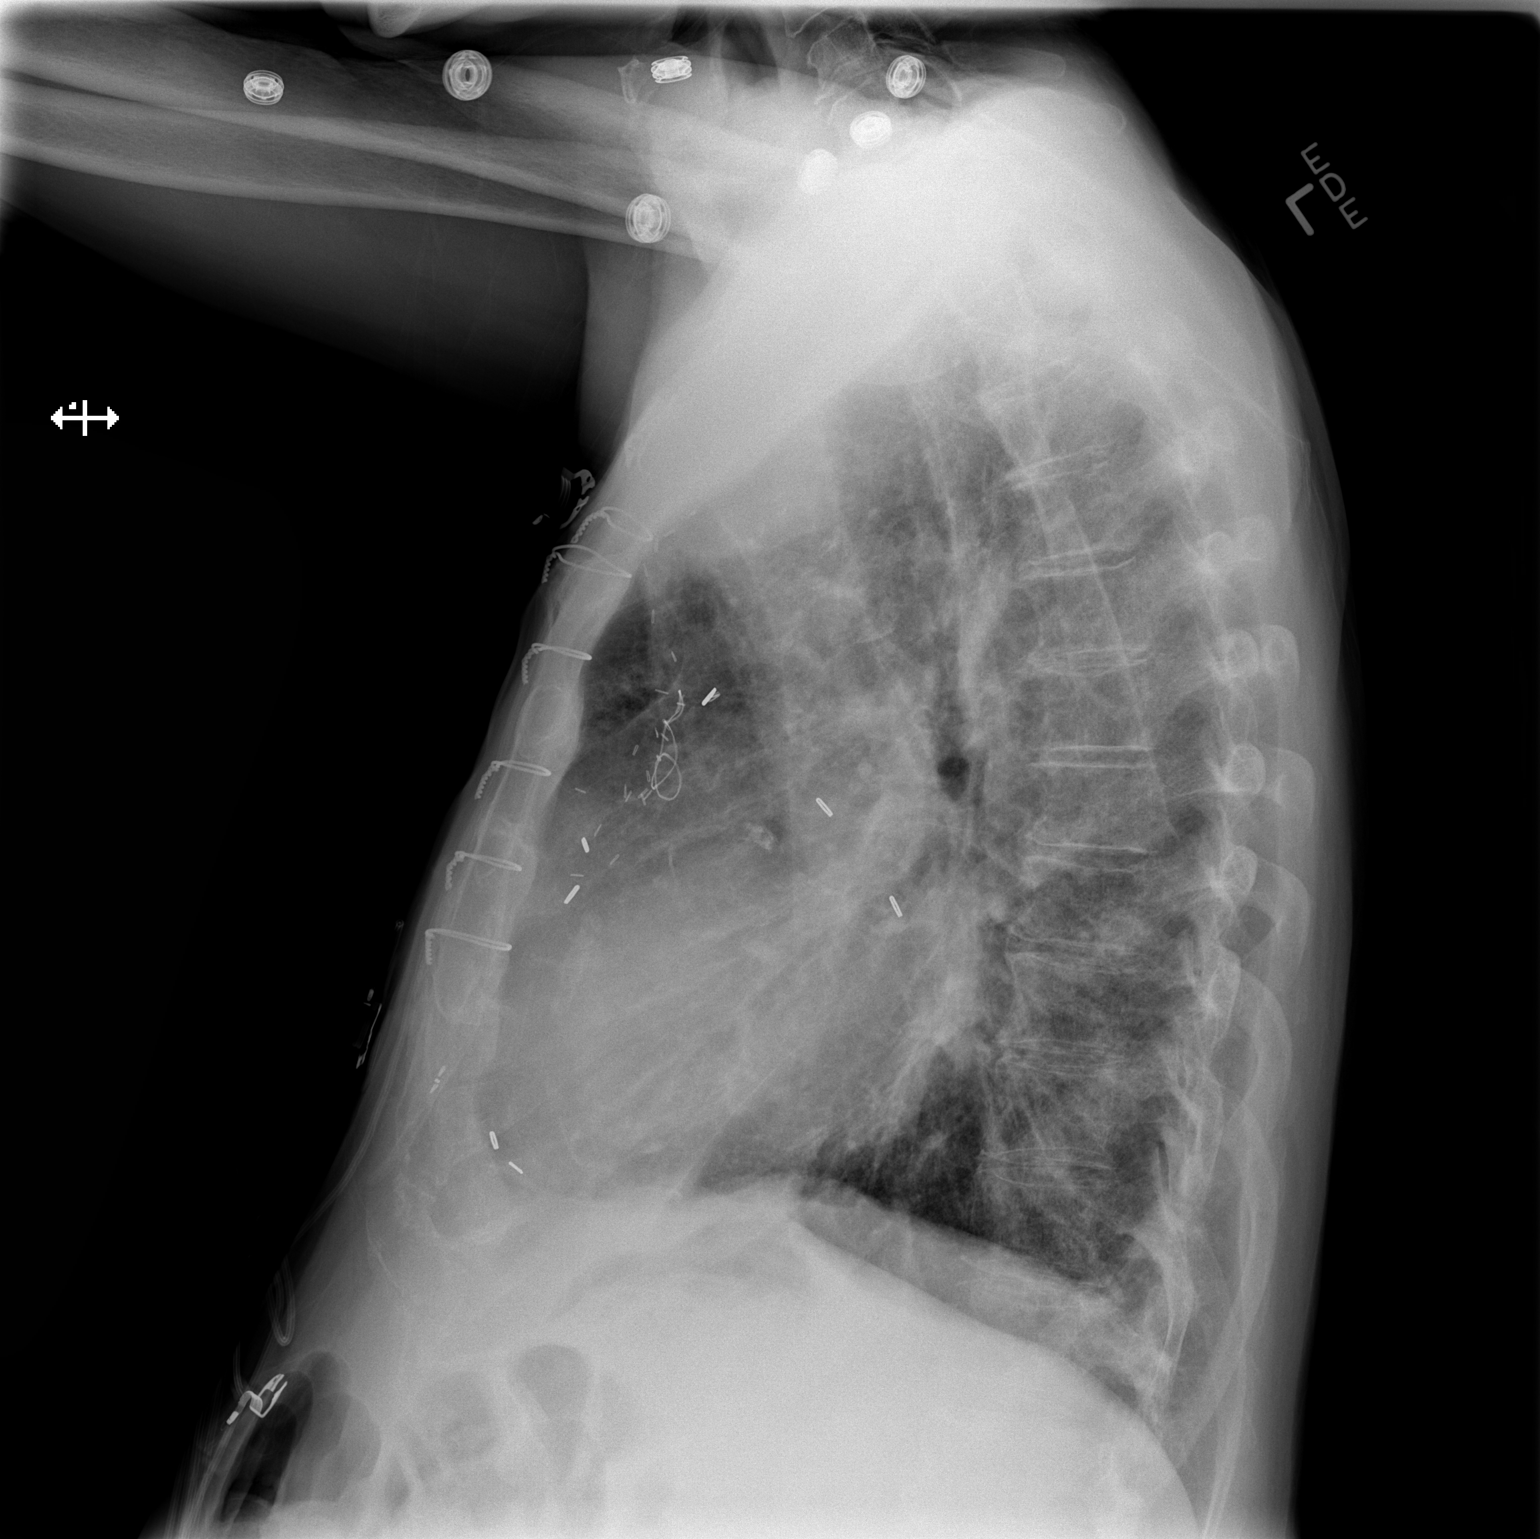

[2 of 2 positions shown; findings below may reference images not displayed]

FINDINGS: There are prominent bronchovascular markings. Mild interstitial
thickening is noted most evident in the peripheral lung bases. The
lungs are mildly hyperexpanded. There are no focal areas
consolidation. No convincing pulmonary edema. There is no pleural
effusion or pneumothorax.

There are changes from prior CABG surgery. The cardiac silhouette is
mildly enlarged. There are normal mediastinal and hilar contours.

The bony thorax is demineralized but intact.
IMPRESSION: No acute cardiopulmonary disease.

## 2014-03-10 IMAGING — US US PARACENTESIS
1 series · 12 of 12 positions shown · non-contrast
Comparison: none

CLINICAL DATA: Recurrent ascites, request for therapeutic
paracentesis.

EXAM:
ULTRASOUND GUIDED PARACENTESIS
TECHNIQUE: Survey ultrasound of the abdomen was performed and an appropriate
skin entry site in the RLQ abdomen was selected. Skin site was
marked, prepped with Betadine, and draped in usual sterile fashion,
and infiltrated locally with 1% lidocaine. A 5 French multisidehole
Zhi Tefera needle was advanced into the peritoneal space until
fluid could be aspirated. The sheath was advanced and the needle
removed. 4.1 liters of amber colored ascites were aspirated. No
immediate complication.

[Series 1: us paracentesis · 0.24mm/px · 12 of 12 slices shown]
[im 1/12]
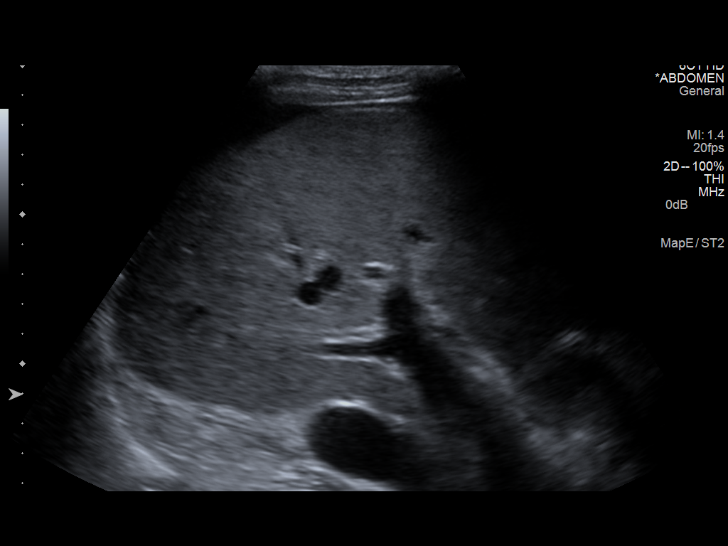
[im 2/12]
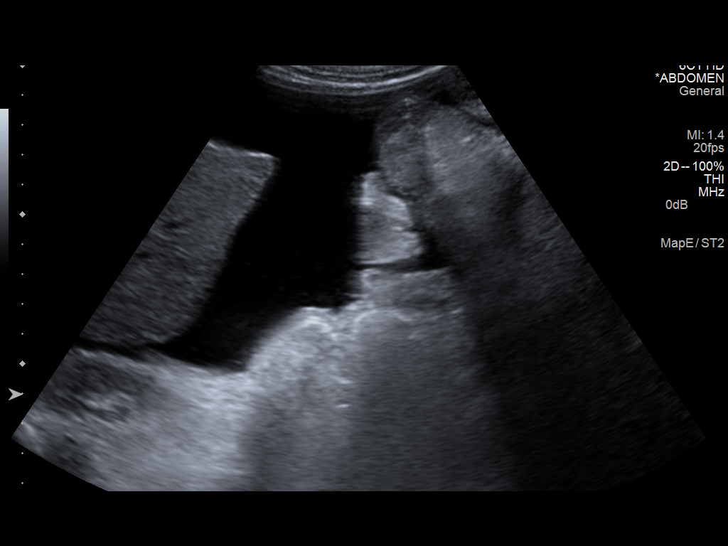
[im 3/12]
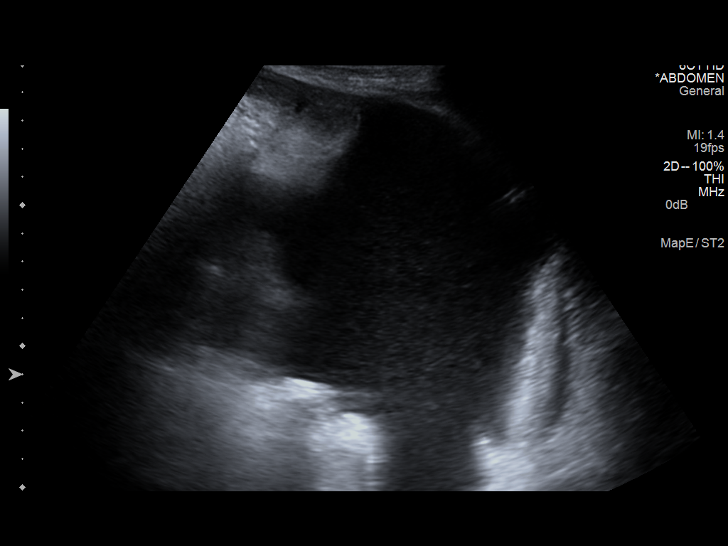
[im 4/12]
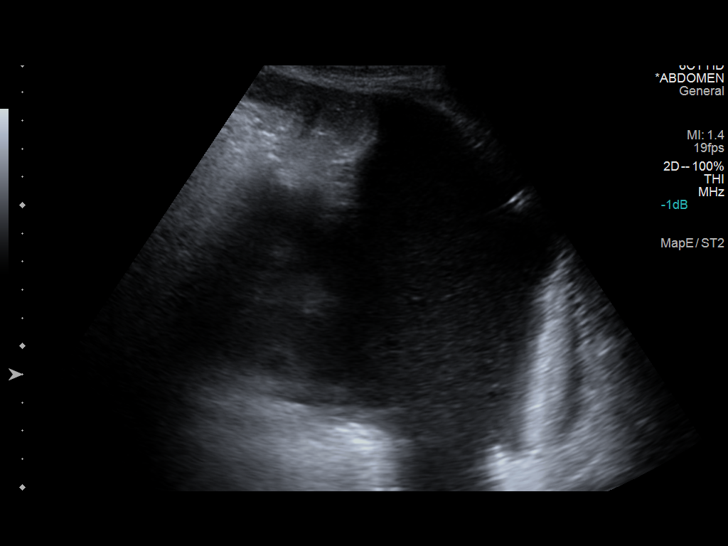
[im 5/12]
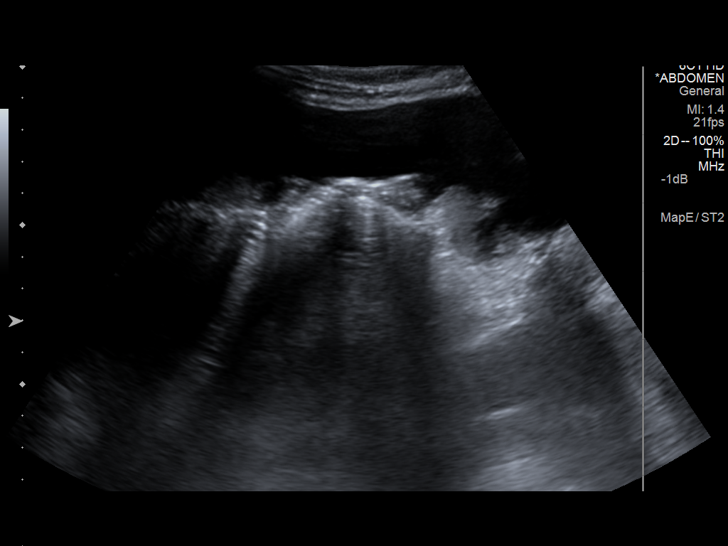
[im 6/12]
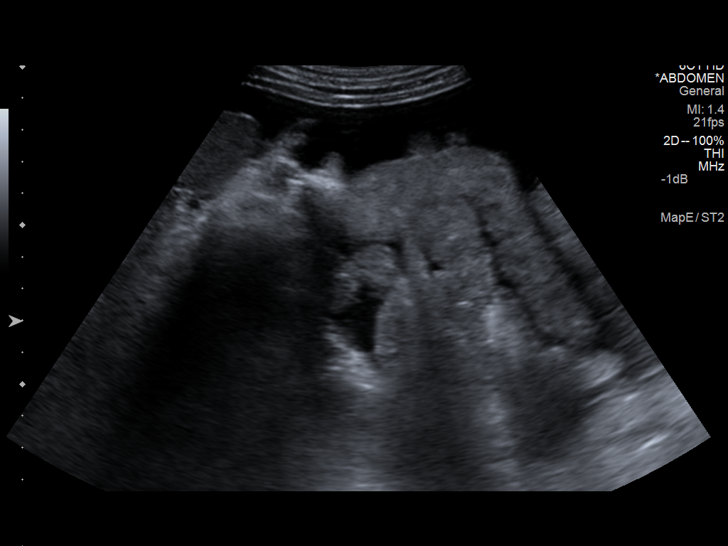
[im 7/12]
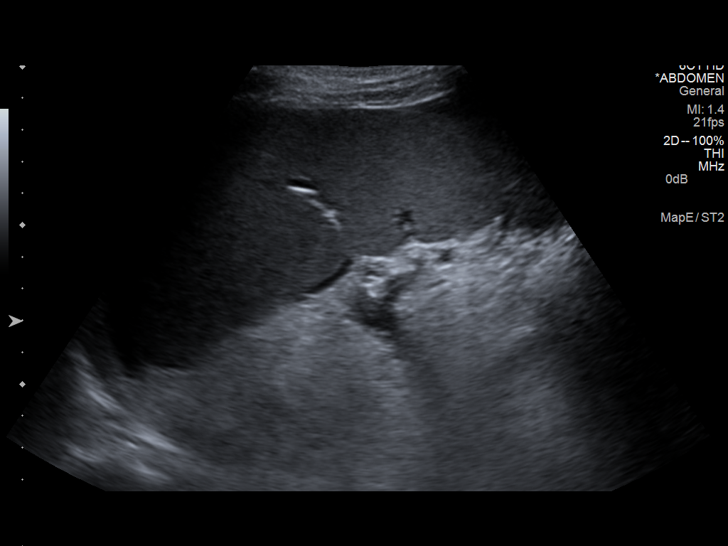
[im 8/12]
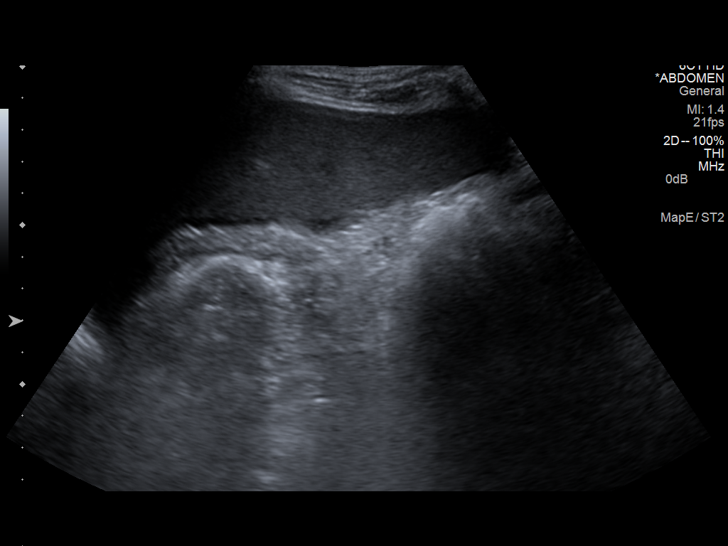
[im 9/12]
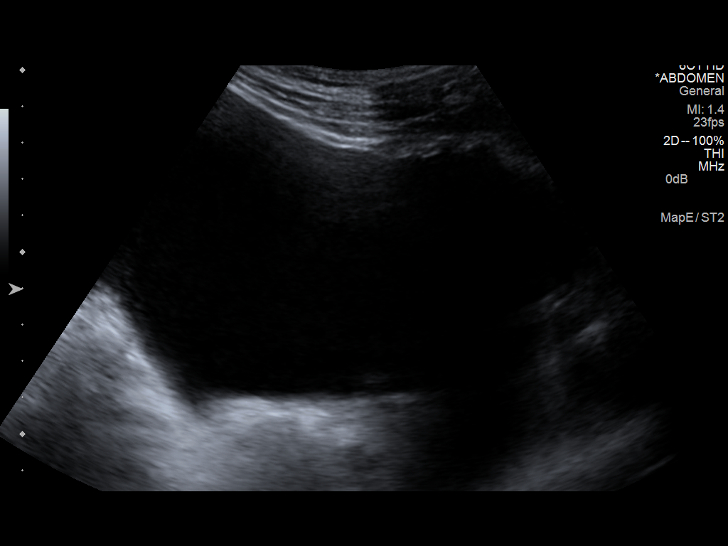
[im 10/12]
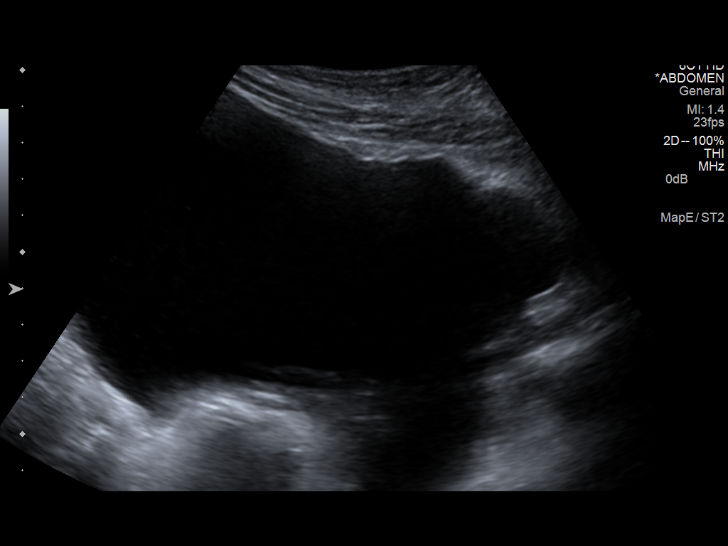
[im 11/12]
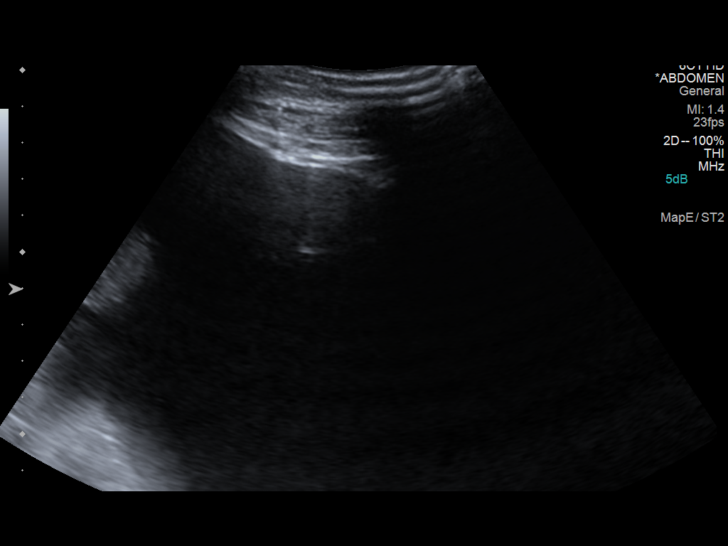
[im 12/12]
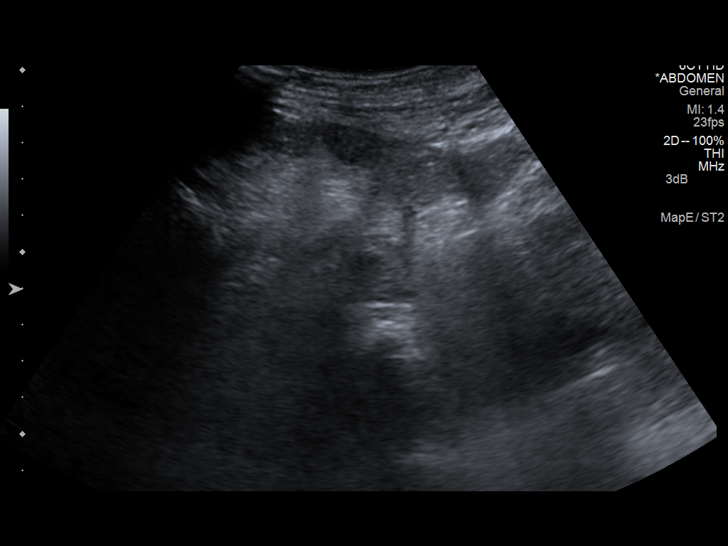

[12 of 12 positions shown; findings below may reference images not displayed]

IMPRESSION: Technically successful ultrasound guided paracentesis, removing
liters of ascites.

## 2014-05-18 IMAGING — CR DG CHEST 1V PORT
1 series · 1 of 1 positions shown · non-contrast
Comparison: None.

CLINICAL DATA: Shortness of breath, tachycardia.

EXAM:
PORTABLE CHEST - 1 VIEW

[ap]
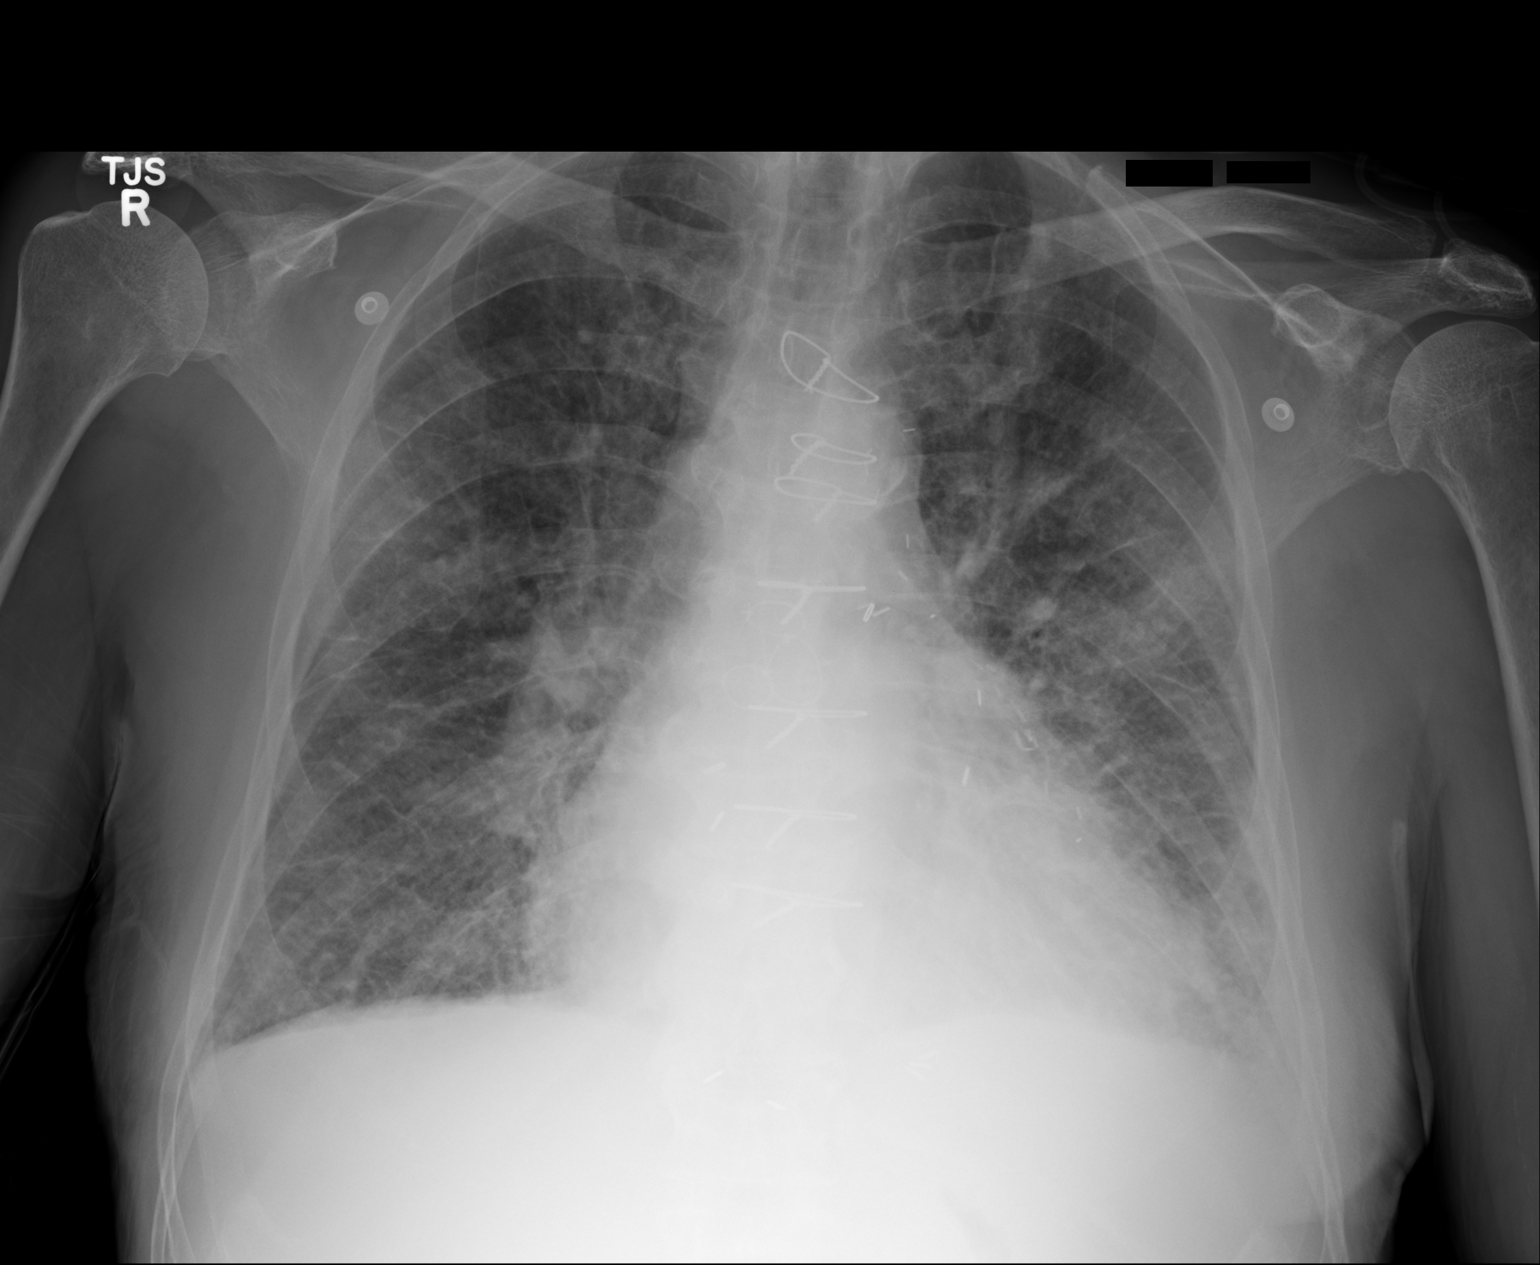

[1 of 1 positions shown; findings below may reference images not displayed]

FINDINGS: The cardiac silhouette appears moderately enlarged, even with
consideration to this portable AP technique. Mediastinal silhouette
is nonsuspicious, status post median sternotomy for apparent
coronary artery bypass grafting. Mildly calcified aortic knob. Mild
to moderate interstitial prominence with central pulmonary
vasculature congestion. No pleural effusions. Minimal left midlung
zone and left lower lobe patchy airspace opacity. No pneumothorax.
Soft tissue planes and included osseous structures are
nonsuspicious. Moderate degenerative change of the thoracic spine.
IMPRESSION: Cardiomegaly, interstitial prominence suggests pulmonary edema. Left
midlung zone and left lower lobe patchy airspace opacities could
reflect atelectasis or confluent edema. Recommend follow-up chest
radiograph after treatment to verify improvement.

  By: Ohaila Crash

## 2014-11-25 NOTE — H&P (Signed)
PATIENT NAME:  [name], [name] MR#:  [id number] DATE OF BIRTH:  Apr 07, 1941  DATE OF ADMISSION:  11/10/2013  PRIMARY CARE PHYSICIAN: Venia Carbon, MD  PRIMARY CARDIOLOGIST: Minna Merritts, MD   CHIEF COMPLAINT: Shortness of breath and palpitations.   HISTORY OF PRESENT ILLNESS: This is a 74 year old male who presents with 2 to 3 days of complaints as stated above. Over the past several days, the patient has had palpitations, noted to be in atrial fibrillation. His heart rates were in the 180s. He noticed that his heart was racing. He felt very short of breath, and he noticed some swelling in his lower extremities. He has been feeling bad for the past few days. In the ER, he was noted to be in atrial fibrillation with RVR, heart rates were 130s. He was given digoxin and nebulizers. He was not given diltiazem due to his low heart rate.   REVIEW OF SYSTEMS:  CONSTITUTIONAL: No fever. Positive fatigue, weakness.  EYES: Positive cataracts. No glaucoma, redness, inflammation. ENT: No ear pain, hearing loss, snoring, postnasal drip.  RESPIRATORY: Positive cough, positive wheezing. Positive history of COPD, not on oxygen. No hemoptysis. Positive shortness of breath.  CARDIOVASCULAR: No chest pain, orthopnea. Positive edema. Positive atrial fibrillation. Positive dyspnea on exertion. Positive palpitations. No syncope.  GASTROINTESTINAL: No nausea, vomiting, diarrhea, abdominal pain, melena or ulcers. GENITOURINARY: No dysuria or hematuria.  ENDOCRINE: No polyuria or polydipsia.  HEMATOLOGIC AND LYMPHATIC: Positive easy bruising.  SKIN: No rash or lesion.  MUSCULOSKELETAL: No pain in the knees or shoulders.  NEUROLOGIC: Positive history of TIA. No CVA, vertigo or ataxia.  PSYCHIATRIC: No history of anxiety or depression.   PAST MEDICAL HISTORY:  1. Coronary artery disease, status post 5-vessel CABG.  2. 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   3. Diabetes.  4. Hypertension.  5. Hyperlipidemia.  6. History of congestive heart failure.  7. Cataracts.  8. Bladder cancer.  9. Atrial fibrillation.  PAST SURGICAL HISTORY:  1. A 5-vessel CABG.  2. Cancer removed from the bladder.   SOCIAL HISTORY: No tobacco, alcohol or drug use.   FAMILY HISTORY: Positive for CAD, diabetes, Parkinson's disease and CVA.   ALLERGIES: No known drug allergies.  MEDICATIONS:  1. Oxycodone 5 mg p.o. q.6 hours p.r.n.  2. Tylenol 2 tablets q.6 hours p.r.n.  3. Aldactone 100 mg daily.  4. Senna Lax 1 tablet b.i.d.  5. Probiotic 1 tablet daily.  6. Pantoprazole 40 mg daily.  7. Levemir 16 units at bedtime.   PHYSICAL EXAMINATION:  VITAL SIGNS: Temperature 97.7, pulse is 130, respirations 22, blood pressure 91/55, 95% on room air.  GENERAL: The patient is alert, oriented, is in moderate distress.  HEENT: Head is atraumatic. Pupils are reactive. Sclerae are anicteric. Mucous membranes are dry. Oropharynx is clear.  NECK: Supple. Positive JVD. No enlarged thyroid. No carotid bruit.  CARDIOVASCULAR: Irregularly irregular, tachycardic, with a 3/6 holosystolic murmur heard throughout the precordium. PMI is laterally displaced.  LUNGS: Diffuse wheezing with crackles at the bases. No rhonchi or dullness to percussion.  ABDOMEN: Slightly distended. Positive mild fluid wave. Bowel sounds are positive. Nontender. No hepatosplenomegaly. No rebound or guarding.  EXTREMITIES: Chronic venous stasis changes with 1+ edema.  NEUROLOGIC: Cranial nerves II through XII are grossly intact. There are no focal deficits. SKIN: Without rash or lesions. He does have lower extremity chronic venous stasis changes.  MUSCULOSKELETAL: 4 out of 5 strength in all extremities. He  has just mildly generalized weakness.   LABORATORY DATA:  The pH 7.41, pCO2 29, pO2 76, this is FiO2 of 36.  BNP 4845. CK 22, CPK-MB 1.8, troponin 0.08.  Sodium 138, potassium 3.5, chloride 108,  bicarbonate 22, BUN 37, creatinine 1.28, glucose is 98, calcium 8.8, bilirubin 2.2, alkaline phosphatase is 119, ALT 28, AST 18, total protein 7.4, albumin is 3.4.  White blood cells 6.6, hemoglobin 13, hematocrit 40, platelets are 153.  Magnesium 1.7.  TSH is 5.14.  Digoxin less than 0.06.  EKG shows atrial fibrillation with a heart rate of 135. No ST elevation or depression.   IMAGING: Chest x-ray shows cardiomegaly, interstitial prominence, suggests pulmonary edema. Left mid lung zone and left lower lobe patchy airspace opacities could reflect atelectasis or confluent edema.   ASSESSMENT AND PLAN: This is a 74 year old male with a history of liver cirrhosis, atrial fibrillation, congestive heart failure of unknown ejection fraction, diabetes, coronary artery disease, chronic obstructive pulmonary disease, who presents with 2 to 3 days of palpitations, shortness of breath, found to have atrial fibrillation/rapid ventricular response and congestive heart failure.  1. Acute respiratory failure as manifested by moderate distress on physical exam as well as an ABG showing some hypoxia. This is secondary to acute congestive heart failure exacerbation. Plan as outlined below.  2. Acute on chronic congestive heart failure exacerbation. Unknown exactly what the patient's echo is, if this is systolic or diastolic in nature. I consulted cardiology, and specifically Dr. Rockey Situ, who is the patient's cardiologist, to clarify exactly what type of congestive heart failure the patient has. I have placed him on Lasix 40 IV q.8. Will monitor I's and O's, daily weights. His blood pressure is low, so we are unable to add any ACE inhibitor to his regimen.  3. Atrial fibrillation and rapid ventricular response. The patient has a history of atrial fibrillation. Apparently, he is not on any anticoagulation, he says due to liver cirrhosis, and apparently cannot even take an aspirin. I do not see any indication in our medical  records for this, and he denies any peptic ulcer disease, but Dr. Rockey Situ is his cardiologist, so I will consult Dr. Rockey Situ for his expertise in anticoagulation for this patient with atrial fibrillation, rapid ventricular response. I have also written for a diltiazem drip. It is noted that his blood pressures are low, so he may not be able to tolerate this, and we may need to continue using digoxin for better heart rate control. Once again, I will speak with Dr. Rockey Situ. The patient will be admitted to stepdown for now. Further recommendations after cardiology consultation.  4. Elevated troponins. I suspect this is demand ischemia from the above issues; however, will go ahead and continue to cycle his cardiac enzymes. He does have a history of coronary artery disease and 5-vessel coronary artery bypass grafting. He has a history of liver cirrhosis, so I will not add a statin, and again, I am not sure why the patient cannot take an aspirin.  5. Diabetes. Will continue with his Levemir, ADA diet, sliding scale insulin.  6. History of liver cirrhosis. His LFTs are not too bad at this time, so we will just continue to monitor that.  7. History of chronic obstructive pulmonary disease. The patient does have wheezing on exam. I suspect this is actually wheezing from pulmonary edema, but he may symptomatically feel better if we also add some IV Solu-Medrol, but there is no increase in sputum, so I will not  add any antibiotics. Will continue nebulizers and continue oxygen and add inhalers.  8. Hypomagnesemia. Will go ahead and replete.   CODE STATUS: The patient is full code status.   CRITICAL CARE TIME SPENT: Approximately 65 minutes.   ____________________________ Donell Beers. Benjie Karvonen, MD spm:lb D: 11/10/2013 08:31:53 ET T: 11/10/2013 09:03:06 ET JOB#: [id number]  cc: Garcia Dalzell P. Benjie Karvonen, MD, <Dictator> Venia Carbon, MD Minna Merritts, MD Donell Beers Malena Timpone MD ELECTRONICALLY SIGNED 11/10/2013 14:33

## 2014-11-25 NOTE — Consult Note (Signed)
General Aspect [name] is a 74yo Caucasian male w/ a complex PMHx including CAD s/p CABG in 1996, PAF s/p ablation, ischemic CM (EF 35-40%), COPD, HLD, HTN, h/o amiodarone-induced hepatotoxicity/cirrhosis, not on anticoagulation due to coagulopathy/gastritis w/ GIB who was admitted to [facility] today with acute respiratory distress. Cardiology was consulted for arrhythmia and CHF.   He has an extensive past medical history. He has not followed up since 06/2013 due to financial obstacles. He has previously been on torsemide and furosemide for volume management in the past for ICM. He was admitted to [facility] 08/2013 due to BRBPR sourced from a gastritis. INR was >4.0. He had cirrhosis by ultrasound and auto-anticoagulation was felt to be contributing. He denied EtOH abuse and amiodarone-induced hepatotoxicity was suspected. Hence, both amiodarone and Coumadin were held. He has had a prior catheter ablation for atrial fibrillation. He has required cardioversion in the past. He has a known IVCD per prior notes. He has not followed up since then to attempt readjusting meds.   Over the past 2-3 days, he endorses tachy-palpitations and DOE progressing to resting dyspnea. He has also experienced lightheadedness, PND, orthopnea, weight increase and increased LE edema. Denies chest pain. He tells me that he is not taking any diuretics like furosemide or torsemide. He seems to be confused as to whether these should have been stopped due to cirrhosis. He continues to take aldactone. From prior notes, torsemide, then later furosemide have been resumed by his PCP. Metolazone was listed on med list on last cardiology follow-up also. Given his worsening symptoms, he called his PCP who recommended presenting to the ED today.   Present Illness . In the ED, an initial EKG revealed a WCT of 180 bpm. RN reports he received a cardizem bolus in the ED. Rate improved to 130 bpm. He was started on Cardizem $RemoveBef'15mg'PrLBpuXnSh$ /hr gtt. Morphology similar  to prior office notes where IVCD/LAFB noted. Initial TnI 0.08. BNP 4845. CXR- cardiomegaly, interstitial prominence suggests pulmonary edema. Left midlung zone and left lower lobe patchy airspace opacities could reflect atelectasis or confluent edema. TSH up at 5.14, free T4. Mg 1.7. AST/ALT WNL. U/a unremarkable.   PAST MEDICAL HISTORY:  1. Coronary artery disease, status post 5-vessel CABG.  2. 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  3. Diabetes.  4. Hypertension.  5. Hyperlipidemia.  6. History of congestive heart failure.  7. Cataracts.  8. Bladder cancer.  9. Atrial fibrillation.  PAST SURGICAL HISTORY:  1. A 5-vessel CABG.  2. Cancer removed from the bladder.  3. Radiofrequency catheter ablation for a-fib  SOCIAL HISTORY: No tobacco, alcohol or drug use.   FAMILY HISTORY: Positive for CAD, diabetes, Parkinson's disease and CVA.   Physical Exam:  GEN well developed, disheveled, anxious-appearing   HEENT pink conjunctivae, PERRL, hearing intact to voice   NECK supple  No masses  trachea midline  JVP 8cm, no bruits   RESP postive use of accessory muscles  tachypneic, bibasilar rales, no wheezing or rhonchi   CARD Tachycardic  regularly irregular, normal S1, S2, no murmurs, gallops   ABD denies tenderness  distended  hypoactive BS   EXTR negative cyanosis/clubbing, 1+ bilateral pretibial edema, R>L, erythematous RLE   SKIN positive rashes, tight to palpation   NEURO follows commands, motor/sensory function intact   PSYCH alert, A+O to time, place, person   Review of Systems:  Subjective/Chief Complaint shortness of breath, palpitations   General: Weight loss or gain  Weakness  Skin: No Complaints    ENT: No Complaints    Eyes: No Complaints    Neck: No Complaints    Respiratory: Short of breath   Cardiovascular: Palpitations  Dyspnea  Orthopnea  Edema   Gastrointestinal: No Complaints   Genitourinary: No  Complaints   Vascular: No Complaints    Musculoskeletal: No Complaints    Neurologic: No Complaints    Hematologic: No Complaints    Endocrine: No Complaints    Psychiatric: No Complaints    Review of Systems: All other systems were reviewed and found to be negative    Medications/Allergies Reviewed Medications/Allergies reviewed    Home Medications: Medication Instructions Status  phenazopyridine 100 mg oral tablet 2 tab(s) orally every 8 hours, As Needed Active  Norco 325 mg-5 mg oral tablet 1 tab(s) orally every 4 hours, As Needed Active  Tylenol 500 mg oral tablet 2 tab(s) orally every 6 hours, As Needed Active  oxyCODONE 5 mg oral tablet 1 tab(s) orally every 6 hours, As Needed Active  Probiotic Formula - oral capsule 1 cap(s) orally once a day Active  spironolactone 25 mg oral tablet 4 tab(s) orally once a day Active  pantoprazole 40 mg oral delayed release tablet 1 tab(s) orally once a day Active  Sennalax-S 50 mg-8.6 mg oral tablet 1 tab(s) orally 2 times a day Active  Levemir 100 units/mL subcutaneous solution 16 unit(s) subcutaneous once a day (at bedtime) Active   Lab Results:  Thyroid:  09-Apr-15 05:09   Thyroxine, Free 1.17 (Result(s) reported on 10 Nov 2013 at 08:55AM.)  Thyroid Stimulating Hormone  5.14 (0.45-4.50 (International Unit)  ----------------------- Pregnant patients have  different reference  ranges for TSH:  - - - - - - - - - -  Pregnant, first trimetser:  0.36 - 2.50 uIU/mL)  Hepatic:  09-Apr-15 05:09   Bilirubin, Total  2.2  Alkaline Phosphatase  119 (45-117 NOTE: New Reference Range 06/24/13)  SGPT (ALT) 28  SGOT (AST) 18  Total Protein, Serum 7.4  Albumin, Serum 3.6  TDMs:  09-Apr-15 05:09   Digoxin, Serum < 0.06 (Therapeutic range for digoxin in patients with atrial fibrillation: 0.8 - 2.0 ng/mL. In patients with congestive heart failure a therapeutic range of 0.5 - 0.8 ng/mL is suggested as higher levels are associated with  an increased risk of toxicity without clear evidence of enhanced efficacy. Digoxin toxicity is commonly associated with serum levels > 2.0 ng/mL but may occur with lower levels, including those in the therapeutic range. Blood samples should be obtained 6-8 hours after administration to assure a reasonable volume of distribution.)  Lab:  09-Apr-15 05:30   pH (ABG) 7.41  PCO2  29  PO2  76  FiO2 36  Base Excess  -5.0  HCO3  18.4  O2 Saturation 97.5  O2 Device [place]  Specimen Site (ABG) RT RADIAL  Specimen Type (ABG) ARTERIAL  Patient Temp (ABG) 37.0 (Result(s) reported on 10 Nov 2013 at 05:41AM.)  Routine Chem:  09-Apr-15 05:09   Result Comment TROPONIN - RESULTS VERIFIED BY REPEAT TESTING.  - CALLED TO BRYAN GRAY AT 3557 ON 11/10/13  - READ-BACK PROCESS PERFORMED.  Result(s) reported on 10 Nov 2013 at 06:24AM.  B-Type Natriuretic Peptide [facility])  [id number] (Result(s) reported on 10 Nov 2013 at 06:16AM.)  Glucose, Serum 98  BUN  37  Creatinine (comp) 1.28  Sodium, Serum 138  Potassium, Serum 3.5  Chloride, Serum  108  CO2, Serum 22  Calcium (Total), Serum 8.8  Osmolality (  calc) 284  eGFR (African American) >60  eGFR (Non-African American)  56 (eGFR values <8mL/min/1.73 m2 may be an indication of chronic kidney disease (CKD). Calculated eGFR is useful in patients with stable renal function. The eGFR calculation will not be reliable in acutely ill patients when serum creatinine is changing rapidly. It is not useful in  patients on dialysis. The eGFR calculation may not be applicable to patients at the low and high extremes of body sizes, pregnant women, and vegetarians.)  Anion Gap 8  Magnesium, Serum  1.7 (1.8-2.4 THERAPEUTIC RANGE: 4-7 mg/dL TOXIC: > 10 mg/dL  -----------------------)  Cardiac:  09-Apr-15 05:09   Troponin I  0.08 (0.00-0.05 0.05 ng/mL or less: NEGATIVE  Repeat testing in 3-6 hrs  if clinically indicated. >0.05 ng/mL: POTENTIAL  MYOCARDIAL INJURY. Repeat   testing in 3-6 hrs if  clinically indicated. NOTE: An increase or decrease  of 30% or more on serial  testing suggests a  clinically important change)  CK, Total  22 (39-308 NOTE: NEW REFERENCE RANGE  09/05/2013)  CPK-MB, Serum 1.8 (Result(s) reported on 10 Nov 2013 at 06:16AM.)  Routine UA:  09-Apr-15 07:17   Color (UA) Yellow  Clarity (UA) Clear  Glucose (UA) Negative  Bilirubin (UA) Negative  Ketones (UA) Trace  Specific Gravity (UA) 1.018  Blood (UA) Negative  pH (UA) 5.0  Protein (UA) 30 mg/dL  Nitrite (UA) Negative  Leukocyte Esterase (UA) Negative (Result(s) reported on 10 Nov 2013 at 07:41AM.)  RBC (UA) 1 /HPF  WBC (UA) 3 /HPF  Bacteria (UA) TRACE  Epithelial Cells (UA) NONE SEEN  Hyaline Cast (UA) 5 /LPF (Result(s) reported on 10 Nov 2013 at 07:41AM.)  Routine Hem:  09-Apr-15 05:09   WBC (CBC) 6.6  RBC (CBC)  4.26  Hemoglobin (CBC) 13.1  Hematocrit (CBC) 40.0  Platelet Count (CBC) 153 (Result(s) reported on 10 Nov 2013 at 05:31AM.)  MCV 94  MCH 30.9  MCHC 32.9  RDW  15.4   EKG:  Interpretation EKG shows atrial fibrillation w/ IVCD/LAFB, LAD   Rate 130   Radiology Results: XRay:    09-Apr-15 05:25, Chest Portable Single View  Chest Portable Single View   REASON FOR EXAM:    Chest pain  COMMENTS:       PROCEDURE: DXR - DXR PORTABLE CHEST SINGLE VIEW  - Nov 10 2013  5:25AM     CLINICAL DATA:  Shortness of breath, tachycardia.    EXAM:  PORTABLE CHEST - 1 VIEW    COMPARISON:  None.    FINDINGS:  The cardiac silhouette appears moderately enlarged, even with  consideration to this portable AP technique. Mediastinal silhouette  is nonsuspicious, status post median sternotomy for apparent  coronary artery bypass grafting. Mildly calcified aortic knob. Mild  to moderate interstitial prominence with central pulmonary  vasculature congestion. No pleural effusions. Minimal left midlung  zone and left lower lobe patchy airspace opacity. No  pneumothorax.  Soft tissue planes and included osseous structuresare  nonsuspicious. Moderate degenerative change of the thoracic spine.     IMPRESSION:  Cardiomegaly, interstitial prominence suggests pulmonary edema. Left  midlung zone and left lower lobe patchy airspace opacities could  reflect atelectasis or confluent edema. Recommend follow-up chest  radiograph after treatment to verify improvement.    Electronically Signed    By: Elon Alas    On: 11/10/2013 05:28         Verified By: Ricky Ala, M.D.,    No Known Allergies:  Vital Signs/Nurse's Notes: **Vital Signs.:   09-Apr-15 09:35  Vital Signs Type Admission  Pulse Pulse 128  Respirations Respirations 27  Systolic BP Systolic BP 675  Diastolic BP (mmHg) Diastolic BP (mmHg) 61  Mean BP 84  Pulse Ox % Pulse Ox % 98  Oxygen Delivery 3L    Impression 1. Acute respiratory distress-  secondary to decompensated CHF as below, arrhythmia with RVR Increased respiratory effort requiring BiPAP support in the CCU.  -- Diuresis with lasix IV -- BiPAP, nebs- critical care following -- Rule out PE as below  2. Atrial fibrillation w/ RVR  Has been off amiodarone due to hepatotoxicity. H/o PAF. Off Coumadin due to prior upper GIB from gastritis. INR 4.0. Auto-anticoagulated due to cirrhosis 08/2013. HR 130s on Cardizem gtt. Slowed to atrial fibrillation w/ controlled rate temporarily (~80 bpm), but reverted back to tachycardia. -- Continue Cardizem gtt -- Add digoxin 0.$RemoveBefor'5mg'dEWfMAVDDxny$  IV x 1, then 0.25 daily thereafter -- Hold on metoprolol for now in setting of heart failure, consider starting at later date -- Avoid amiodarone given concern for hepatotoxicity -- Structural cardiac changes likely preclude 1C agents as well -- If hemodynamically unstable despite rate-control, would need to consider cardioversion even off anticoagulation -- Hold on anticoagulation for now given recent h/o GIB  3. Acute on chronic systolic  CHF/ischemic cardiomyopathy- secondary to #2. The patient has also has not been taking scheduled diuretics. Previously on torsemide, furosemide and metolazone. 2D echo 08/2013 showed EF 35-40%, inferior HK, mod LAE, mild-mod RAE, mod TR. Noted to have moderate pHTN. BNP elevated this admission. Increased respiratory effort. Bibasilar rales, abdominal distention, LE edema on exam. -- Continue Lasix $RemoveBefore'40mg'jtrHMNephLQZT$  q8hr IV -- Monitor strict I/Os, daily weights, daily BMPs, salt/fluid restriction -- Continue spironolactone, consider adding ACEi, BB once stable   Plan . 4. Elevated troponin- 0.08 in the ED. Suspect demand related. Underlying CAD, ischemic cardiomyopathy. Presented with atrial fibrillation w/ RVR- 180s. Supply-demand mismatch. Denies chest pain. No specific ischemic changes on EKG. -- Continue cycling troponins -- Full-dose ASA -- Start at least moderate potency statin -- Check lipid panel  5. Unilateral leg swelling- RLE erythematous, mildly tender. H/o cellulitis in that leg.  -- LE doppler to r/o DVT/PE -- If negative, empiric abx for cellulitis  6. Hypomagnesemia- 1.7. -- Replete  7. H/o liver cirrhosis- suspected secondary to amiodarone. Distended on exam. Baseline per patient. LFTs largely WNL.  -- Check PT/INR -- Avoid hepatotoxic meds for now including amiodarone   Electronic Signatures: Meriel Pica (PA-C)  (Signed 09-Apr-15 14:54)  Authored: General Aspect/Present Illness, History and Physical Exam, Review of System, Home Medications, Labs, EKG , Radiology, Allergies, Vital Signs/Nurse's Notes, Impression/Plan Ida Rogue (MD)  (Signed 09-Apr-15 19:41)  Authored: General Aspect/Present Illness, History and Physical Exam, Review of System, EKG , Vital Signs/Nurse's Notes, Impression/Plan  Co-Signer: General Aspect/Present Illness, Home Medications, Allergies   Last Updated: 09-Apr-15 19:41 by Ida Rogue (MD)

## 2014-11-25 NOTE — Discharge Summary (Signed)
PATIENT NAME:  [name], [name] MR#:  [id number] DATE OF BIRTH:  03/10/1941  DATE OF ADMISSION:  11/10/2013 DATE OF DISCHARGE:  11/12/2013  ADMISSION DIAGNOSIS:  Atrial fibrillation with rapid ventricular response.   DISCHARGE DIAGNOSES:   1.  Atrial fibrillation with rapid ventricular response, rate now controlled.  2.  Acute on chronic systolic heart failure.   3.  Mildly elevated troponin.  4.  Liver cirrhosis.  5.  Diabetes.   CONSULTATIONS:  [facility].   LABORATORY DATA:  Sodium 136, potassium 4.2, chloride 106, bicarb 23, BUN 54, creatinine 1.56, glucose is 186.  Digoxin at discharge was 2.5.   HOSPITAL COURSE:  This is a 74 year old male with a complex past medical history including CAD status post CABG in 1996, atrial fibrillation, status post ablation, ischemic cardiomyopathy, EF of 35% to 40%, COPD, hyperlipidemia, hypertension, history of amiodarone-induced hepatotoxicity/cirrhosis, not on anticoagulation due to coagulopathy/gastritis with GI bleed who was admitted with acute respiratory distress and A. Fib in RVR.  For further details, please refer to the H and P.  1.  Atrial fibrillation, RVR.  The patient was admitted to the CCU on diltiazem drip and low dose metoprolol.  His blood pressure is currently on the low normal side so it is really hard to control his heart rate with diltiazem; however, we did start a low dose of beta blocker and he actually tolerated this well.  We did discontinue his spironolactone to give room for the metoprolol.  Cardiology was consulted.  He was actually placed on digoxin here in the hospital; however, his level is so high at 2.4, Dr. Rockey Situ did not recommend discharging the patient with digoxin at this time.  Anticoagulation is contraindicated due to recent GI bleed.  2.  Acute on chronic systolic heart failure.  The patient was in acute on chronic systolic heart failure secondary to his A. Fib and RVR.  He was diuresed with IV Lasix,  transitioned to by mouth Lasix and is doing quite well without needing oxygen at discharge.  He was actually at one time placed on a BiPAP which probably helped with his diuresed as well.   3.  Mildly elevated troponin, likely due to supply demand ischemia.  Continue medical therapy.  4.  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  5.  Diabetes.  The patient will continue with outpatient medications.   DISCHARGE MEDICATIONS: 1.  Phenazopyridine 100 mg 2 tablets q. 8 hours as needed.  2.  Norco 3/325 1 tablet q. 4 hours as needed.  3.  Tylenol 500 mg 2 tablets q. 6 hours as needed.  4.  flonase as needed.  5.  Probiotic 1 tablet daily. 6.  Pantoprazole 40 mg daily.  7.  Senna Lax 1 tablet twice daily.  8.  Levemir 15 units at bedtime.  9.  Metoprolol 25 mg twice daily.  10.  Fluticasone salmeterol 100/50 twice daily.  11.  Lasix 40 mg daily.  12.  The patient will stop taking Aldactone.   DISPOSITION:  Discharge home with home health, physical therapy, nurse and nurse aid.   DISCHARGE DIET:  Low sodium, ADA diet.   DISCHARGE ACTIVITY:  As tolerated.    DISCHARGE FOLLOW-UP:  The patient will follow up with Dr. Rockey Situ in one week as well as Dr. Silvio Pate.   TIME SPENT:  Approximately 35 minutes on discharge.  The patient was medically stable for discharge.     ____________________________ Malik Paar P. Benjie Karvonen, MD spm:ea D: 11/12/2013  19:47:16 ET T: 11/13/2013 06:15:24 ET JOB#: [id number]  cc: Antoney Biven P. Benjie Karvonen, MD, <Dictator> Minna Merritts, MD Mertie Clause Fletcher Anon, MD Venia Carbon, MD Donell Beers Marra Fraga MD ELECTRONICALLY SIGNED 11/13/2013 14:13
